# Patient Record
Sex: Female | Born: 1935 | Race: White | Hispanic: No | Smoking: Former smoker
Health system: Southern US, Community
[De-identification: ages and names within clinical notes are randomized; demographics above are authoritative.]

## PROBLEM LIST (undated history)

## (undated) DIAGNOSIS — I639 Cerebral infarction, unspecified: Secondary | ICD-10-CM

## (undated) DIAGNOSIS — I10 Essential (primary) hypertension: Secondary | ICD-10-CM

## (undated) DIAGNOSIS — F4321 Adjustment disorder with depressed mood: Secondary | ICD-10-CM

## (undated) DIAGNOSIS — E039 Hypothyroidism, unspecified: Secondary | ICD-10-CM

## (undated) DIAGNOSIS — I447 Left bundle-branch block, unspecified: Secondary | ICD-10-CM

## (undated) DIAGNOSIS — I255 Ischemic cardiomyopathy: Secondary | ICD-10-CM

## (undated) DIAGNOSIS — I214 Non-ST elevation (NSTEMI) myocardial infarction: Secondary | ICD-10-CM

## (undated) DIAGNOSIS — I469 Cardiac arrest, cause unspecified: Secondary | ICD-10-CM

## (undated) DIAGNOSIS — E785 Hyperlipidemia, unspecified: Secondary | ICD-10-CM

## (undated) DIAGNOSIS — I501 Left ventricular failure: Secondary | ICD-10-CM

## (undated) DIAGNOSIS — R54 Age-related physical debility: Secondary | ICD-10-CM

## (undated) HISTORY — DX: Cardiac arrest, cause unspecified: I46.9

## (undated) HISTORY — DX: Hyperlipidemia, unspecified: E78.5

## (undated) HISTORY — DX: Cerebral infarction, unspecified: I63.9

## (undated) HISTORY — DX: Left bundle-branch block, unspecified: I44.7

## (undated) HISTORY — DX: Essential (primary) hypertension: I10

## (undated) HISTORY — DX: Ischemic cardiomyopathy: I25.5

## (undated) HISTORY — DX: Hypothyroidism, unspecified: E03.9

## (undated) HISTORY — PX: OVARIAN CYST REMOVAL: SHX89

## (undated) HISTORY — DX: Non-ST elevation (NSTEMI) myocardial infarction: I21.4

## (undated) HISTORY — DX: Age-related physical debility: R54

## (undated) HISTORY — DX: Adjustment disorder with depressed mood: F43.21

## (undated) HISTORY — PX: DILATION AND CURETTAGE OF UTERUS: SHX78

---

## 1998-12-08 ENCOUNTER — Encounter: Payer: Self-pay | Admitting: Internal Medicine

## 1998-12-08 ENCOUNTER — Ambulatory Visit (HOSPITAL_COMMUNITY): Admission: RE | Admit: 1998-12-08 | Discharge: 1998-12-08 | Payer: Self-pay | Admitting: Internal Medicine

## 2000-02-01 ENCOUNTER — Ambulatory Visit (HOSPITAL_COMMUNITY): Admission: RE | Admit: 2000-02-01 | Discharge: 2000-02-01 | Payer: Self-pay | Admitting: Internal Medicine

## 2000-02-01 ENCOUNTER — Encounter: Payer: Self-pay | Admitting: Internal Medicine

## 2001-02-16 ENCOUNTER — Ambulatory Visit (HOSPITAL_COMMUNITY): Admission: RE | Admit: 2001-02-16 | Discharge: 2001-02-16 | Payer: Self-pay | Admitting: Internal Medicine

## 2001-02-16 ENCOUNTER — Encounter: Payer: Self-pay | Admitting: Internal Medicine

## 2001-02-21 ENCOUNTER — Encounter: Payer: Self-pay | Admitting: Internal Medicine

## 2001-02-21 ENCOUNTER — Encounter: Admission: RE | Admit: 2001-02-21 | Discharge: 2001-02-21 | Payer: Self-pay | Admitting: Internal Medicine

## 2002-02-18 ENCOUNTER — Ambulatory Visit (HOSPITAL_COMMUNITY): Admission: RE | Admit: 2002-02-18 | Discharge: 2002-02-18 | Payer: Self-pay | Admitting: Internal Medicine

## 2002-02-18 ENCOUNTER — Encounter: Payer: Self-pay | Admitting: Internal Medicine

## 2003-03-04 ENCOUNTER — Encounter: Payer: Self-pay | Admitting: Obstetrics and Gynecology

## 2003-03-04 ENCOUNTER — Ambulatory Visit (HOSPITAL_COMMUNITY): Admission: RE | Admit: 2003-03-04 | Discharge: 2003-03-04 | Payer: Self-pay | Admitting: Obstetrics and Gynecology

## 2003-05-20 ENCOUNTER — Ambulatory Visit (HOSPITAL_COMMUNITY): Admission: RE | Admit: 2003-05-20 | Discharge: 2003-05-20 | Payer: Self-pay | Admitting: Gastroenterology

## 2003-07-26 ENCOUNTER — Emergency Department (HOSPITAL_COMMUNITY): Admission: EM | Admit: 2003-07-26 | Discharge: 2003-07-26 | Payer: Self-pay | Admitting: Emergency Medicine

## 2004-03-04 ENCOUNTER — Ambulatory Visit (HOSPITAL_COMMUNITY): Admission: RE | Admit: 2004-03-04 | Discharge: 2004-03-04 | Payer: Self-pay | Admitting: Internal Medicine

## 2004-06-15 ENCOUNTER — Encounter: Admission: RE | Admit: 2004-06-15 | Discharge: 2004-06-15 | Payer: Self-pay | Admitting: Internal Medicine

## 2004-09-22 ENCOUNTER — Ambulatory Visit (HOSPITAL_COMMUNITY): Admission: RE | Admit: 2004-09-22 | Discharge: 2004-09-22 | Payer: Self-pay | Admitting: Obstetrics and Gynecology

## 2004-10-18 ENCOUNTER — Encounter (INDEPENDENT_AMBULATORY_CARE_PROVIDER_SITE_OTHER): Payer: Self-pay | Admitting: Specialist

## 2004-10-18 ENCOUNTER — Ambulatory Visit (HOSPITAL_COMMUNITY): Admission: RE | Admit: 2004-10-18 | Discharge: 2004-10-18 | Payer: Self-pay | Admitting: *Deleted

## 2005-04-28 ENCOUNTER — Ambulatory Visit (HOSPITAL_COMMUNITY): Admission: RE | Admit: 2005-04-28 | Discharge: 2005-04-28 | Payer: Self-pay | Admitting: Internal Medicine

## 2005-07-11 DIAGNOSIS — I469 Cardiac arrest, cause unspecified: Secondary | ICD-10-CM

## 2005-07-11 HISTORY — DX: Cardiac arrest, cause unspecified: I46.9

## 2006-03-21 ENCOUNTER — Ambulatory Visit: Payer: Self-pay | Admitting: Internal Medicine

## 2006-03-21 ENCOUNTER — Encounter (INDEPENDENT_AMBULATORY_CARE_PROVIDER_SITE_OTHER): Payer: Self-pay | Admitting: Specialist

## 2006-03-21 ENCOUNTER — Ambulatory Visit (HOSPITAL_COMMUNITY): Admission: RE | Admit: 2006-03-21 | Discharge: 2006-03-22 | Payer: Self-pay | Admitting: *Deleted

## 2006-05-25 ENCOUNTER — Ambulatory Visit (HOSPITAL_COMMUNITY): Admission: RE | Admit: 2006-05-25 | Discharge: 2006-05-25 | Payer: Self-pay | Admitting: Obstetrics and Gynecology

## 2006-08-02 ENCOUNTER — Other Ambulatory Visit: Admission: RE | Admit: 2006-08-02 | Discharge: 2006-08-02 | Payer: Self-pay | Admitting: *Deleted

## 2007-06-19 ENCOUNTER — Ambulatory Visit (HOSPITAL_COMMUNITY): Admission: RE | Admit: 2007-06-19 | Discharge: 2007-06-19 | Payer: Self-pay | Admitting: Internal Medicine

## 2007-12-10 DIAGNOSIS — I255 Ischemic cardiomyopathy: Secondary | ICD-10-CM

## 2007-12-10 DIAGNOSIS — I214 Non-ST elevation (NSTEMI) myocardial infarction: Secondary | ICD-10-CM

## 2007-12-10 HISTORY — DX: Non-ST elevation (NSTEMI) myocardial infarction: I21.4

## 2007-12-10 HISTORY — DX: Ischemic cardiomyopathy: I25.5

## 2007-12-20 ENCOUNTER — Inpatient Hospital Stay (HOSPITAL_COMMUNITY): Admission: EM | Admit: 2007-12-20 | Discharge: 2007-12-23 | Payer: Self-pay | Admitting: Emergency Medicine

## 2007-12-20 ENCOUNTER — Encounter (INDEPENDENT_AMBULATORY_CARE_PROVIDER_SITE_OTHER): Payer: Self-pay | Admitting: Emergency Medicine

## 2009-04-10 DIAGNOSIS — I639 Cerebral infarction, unspecified: Secondary | ICD-10-CM

## 2009-04-10 HISTORY — DX: Cerebral infarction, unspecified: I63.9

## 2009-05-15 ENCOUNTER — Emergency Department (HOSPITAL_COMMUNITY): Admission: EM | Admit: 2009-05-15 | Discharge: 2009-05-16 | Payer: Self-pay | Admitting: Emergency Medicine

## 2009-10-19 ENCOUNTER — Ambulatory Visit (HOSPITAL_COMMUNITY): Admission: RE | Admit: 2009-10-19 | Discharge: 2009-10-19 | Payer: Self-pay | Admitting: Internal Medicine

## 2010-04-01 ENCOUNTER — Ambulatory Visit: Payer: Self-pay | Admitting: Cardiology

## 2010-04-01 LAB — BASIC METABOLIC PANEL: BUN: 24 mg/dL — AB (ref 4–21)

## 2010-04-01 LAB — LIPID PANEL
Cholesterol: 172 mg/dL (ref 0–200)
LDL Cholesterol: 91 mg/dL
Triglycerides: 126 mg/dL (ref 40–160)

## 2010-06-17 ENCOUNTER — Ambulatory Visit: Payer: Self-pay | Admitting: Cardiology

## 2010-08-16 ENCOUNTER — Encounter: Payer: Self-pay | Admitting: Cardiology

## 2010-08-16 DIAGNOSIS — E039 Hypothyroidism, unspecified: Secondary | ICD-10-CM | POA: Insufficient documentation

## 2010-08-16 DIAGNOSIS — E785 Hyperlipidemia, unspecified: Secondary | ICD-10-CM | POA: Insufficient documentation

## 2010-08-16 DIAGNOSIS — I255 Ischemic cardiomyopathy: Secondary | ICD-10-CM | POA: Insufficient documentation

## 2010-10-04 ENCOUNTER — Other Ambulatory Visit: Payer: Self-pay | Admitting: Cardiology

## 2010-10-04 ENCOUNTER — Other Ambulatory Visit (INDEPENDENT_AMBULATORY_CARE_PROVIDER_SITE_OTHER): Payer: Medicare Other | Admitting: *Deleted

## 2010-10-04 DIAGNOSIS — E7800 Pure hypercholesterolemia, unspecified: Secondary | ICD-10-CM

## 2010-10-04 DIAGNOSIS — R0789 Other chest pain: Secondary | ICD-10-CM

## 2010-10-05 LAB — COMPREHENSIVE METABOLIC PANEL
ALT: 10 U/L (ref 0–35)
AST: 18 U/L (ref 0–37)
Albumin: 4.5 g/dL (ref 3.5–5.2)
Alkaline Phosphatase: 62 U/L (ref 39–117)
Calcium: 9.2 mg/dL (ref 8.4–10.5)
Chloride: 105 mEq/L (ref 96–112)
Creat: 1.03 mg/dL (ref 0.40–1.20)
Glucose, Bld: 97 mg/dL (ref 70–99)
Potassium: 4.4 mEq/L (ref 3.5–5.3)
Sodium: 145 mEq/L (ref 135–145)
Total Bilirubin: 1.2 mg/dL (ref 0.3–1.2)
Total Protein: 6.9 g/dL (ref 6.0–8.3)

## 2010-10-05 LAB — LIPID PANEL
HDL: 55 mg/dL (ref >39)
LDL Cholesterol: 81 mg/dL (ref 0–99)
Total CHOL/HDL Ratio: 3 Ratio
Triglycerides: 133 mg/dL (ref <150)

## 2010-10-06 ENCOUNTER — Ambulatory Visit (INDEPENDENT_AMBULATORY_CARE_PROVIDER_SITE_OTHER): Payer: Medicare Other | Admitting: Cardiology

## 2010-10-06 ENCOUNTER — Encounter: Payer: Self-pay | Admitting: Cardiology

## 2010-10-06 VITALS — BP 110/60 | HR 68 | Wt 149.4 lb

## 2010-10-06 DIAGNOSIS — E785 Hyperlipidemia, unspecified: Secondary | ICD-10-CM

## 2010-10-06 DIAGNOSIS — I2589 Other forms of chronic ischemic heart disease: Secondary | ICD-10-CM

## 2010-10-06 DIAGNOSIS — Z79899 Other long term (current) drug therapy: Secondary | ICD-10-CM

## 2010-10-06 DIAGNOSIS — I509 Heart failure, unspecified: Secondary | ICD-10-CM

## 2010-10-06 DIAGNOSIS — I255 Ischemic cardiomyopathy: Secondary | ICD-10-CM

## 2010-10-06 DIAGNOSIS — E039 Hypothyroidism, unspecified: Secondary | ICD-10-CM

## 2010-10-06 LAB — COMPREHENSIVE METABOLIC PANEL
ALT: 12 U/L (ref 0–35)
AST: 19 U/L (ref 0–37)
Albumin: 4.5 g/dL (ref 3.5–5.2)
BUN: 20 mg/dL (ref 6–23)
CO2: 25 mEq/L (ref 19–32)
Calcium: 9.3 mg/dL (ref 8.4–10.5)
Chloride: 103 mEq/L (ref 96–112)
Creat: 1.09 mg/dL (ref 0.40–1.20)
Glucose, Bld: 82 mg/dL (ref 70–99)
Potassium: 4.1 mEq/L (ref 3.5–5.3)
Sodium: 141 mEq/L (ref 135–145)
Total Bilirubin: 1.2 mg/dL (ref 0.3–1.2)
Total Protein: 6.9 g/dL (ref 6.0–8.3)

## 2010-10-06 LAB — LIPID PANEL
Cholesterol: 173 mg/dL (ref 0–200)
HDL: 56 mg/dL (ref >39)
LDL Cholesterol: 82 mg/dL (ref 0–99)
Total CHOL/HDL Ratio: 3.1 Ratio
Triglycerides: 175 mg/dL — ABNORMAL HIGH (ref <150)
VLDL: 35 mg/dL (ref 0–40)

## 2010-10-06 LAB — CBC WITH DIFFERENTIAL/PLATELET
Basophils Absolute: 0 10*3/uL (ref 0.0–0.1)
Basophils Relative: 0 % (ref 0–1)
Eosinophils Absolute: 0.2 10*3/uL (ref 0.0–0.7)
Eosinophils Relative: 3 % (ref 0–5)
HCT: 40.7 % (ref 36.0–46.0)
Hemoglobin: 13 g/dL (ref 12.0–15.0)
Lymphocytes Relative: 37 % (ref 12–46)
Lymphs Abs: 2.5 10*3/uL (ref 0.7–4.0)
MCH: 30.1 pg (ref 26.0–34.0)
MCV: 94.2 fL (ref 78.0–100.0)
Monocytes Absolute: 0.6 10*3/uL (ref 0.1–1.0)
Monocytes Relative: 9 % (ref 3–12)
Neutro Abs: 3.4 10*3/uL (ref 1.7–7.7)
Neutrophils Relative %: 51 % (ref 43–77)
RBC: 4.32 MIL/uL (ref 3.87–5.11)
WBC: 6.6 10*3/uL (ref 4.0–10.5)

## 2010-10-06 LAB — TSH: TSH: 1.454 u[IU]/mL (ref 0.350–4.500)

## 2010-10-06 NOTE — Assessment & Plan Note (Signed)
Lab work is pending today

## 2010-10-06 NOTE — Progress Notes (Signed)
Addended by: Barnetta Hammersmith on: 10/06/2010 03:45 PM   Modules accepted: Orders

## 2010-10-06 NOTE — Assessment & Plan Note (Signed)
I really think Mrs. Sistrunk distal well. With her last lab work, her BNP was only 6. For now, we'll continue her current medicines but we will repeat her 2-D echocardiogram. I'll check C. Met, lipids, CBC, dig level, TSH and BNP today. I will have her see Lawson Fiscal in 6 months.

## 2010-10-06 NOTE — Progress Notes (Signed)
Subjective:   [name] is seen today for followup visit. For 77 year old, she is doing great. She had congestive heart failure and left ventricular dysfunction by echocardiography in June of 2011. She refused cardiac catheterization. At that time, she was under a great deal of stress with the death of her husband. She is improved significantly and is increasing her activity with minimal symptoms of weakness or shortness of breath. About 2 weeks ago, she did have a mild GI bug but otherwise has been doing well. She had some blurred vision a few weeks ago there have been attributed to dry eyes. She has not had a repeat echocardiogram  Current Outpatient Prescriptions  Medication Sig Dispense Refill  . aspirin 81 MG tablet Take 81 mg by mouth daily.        Marland Kitchen atorvastatin (LIPITOR) 10 MG tablet Take 10 mg by mouth daily.        . digoxin (LANOXIN) 0.125 MG tablet Take 125 mcg by mouth daily.        . furosemide (LASIX) 20 MG tablet Take 20 mg by mouth daily.        Marland Kitchen levothyroxine (SYNTHROID, LEVOTHROID) 75 MCG tablet Take 75 mcg by mouth daily.        Marland Kitchen lisinopril (PRINIVIL,ZESTRIL) 2.5 MG tablet Take 2.5 mg by mouth daily.        . nitroGLYCERIN (NITRODUR) 0.2 mg/hr Place 1 patch onto the skin at bedtime.        . nitroGLYCERIN (NITROSTAT) 0.4 MG SL tablet Place 0.4 mg under the tongue every 5 (five) minutes as needed.        . potassium chloride (KLOR-CON) 10 MEQ CR tablet Take 10 mEq by mouth daily.        Marland Kitchen DISCONTD: levothyroxine (SYNTHROID, LEVOTHROID) 25 MCG tablet Take 25 mcg by mouth daily.         Allergies  Allergen Reactions  . Novocain     Patient Active Problem List  Diagnoses  . Ischemic cardiomyopathy  . Hypothyroidism  . Hyperlipemia    History  Smoking status  . Former Smoker  . Quit date: 07/11/1968  Smokeless tobacco  . Never Used    History  Alcohol Use No    Family History  Problem Relation Age of Onset  . Aneurysm Father   . Heart attack Mother      Review of Systems:   The patient denies any heat or cold intolerance.  No weight gain or weight loss.  The patient denies headaches or blurry vision.  There is no cough or sputum production.  The patient denies dizziness.  There is no hematuria or hematochezia.  The patient denies any muscle aches or arthritis.  The patient denies any rash.  The patient denies frequent falling or instability.  There is no history of depression or anxiety.  All other systems were reviewed and are negative.   Physical Exam:   Her weight is 149. Blood pressure is 90/58 sitting, 110/60 standing, heart rate is 68.The head is normocephalic and atraumatic.  Pupils are equally round and reactive to light.  Sclerae nonicteric.  Conjunctiva is clear.  Oropharynx is unremarkable.  There's adequate oral airway.  Neck is supple there are no masses.  Thyroid is not enlarged.  There is no lymphadenopathy.  Lungs are clear.  Chest is symmetric.  Heart shows a regular rate and rhythm.  S1 and S2 are normal.  There is no murmur click or gallop.  Abdomen is soft  normal bowel sounds.  There is no organomegaly.  Genital and rectal deferred.  Extremities are without edema.  Peripheral pulses are adequate.  Neurologically intact.  Full range of motion.  The patient is not depressed.  Skin is warm and dry.  Assessment / Plan:

## 2010-10-06 NOTE — Assessment & Plan Note (Signed)
Lab work is pending today 

## 2010-10-07 ENCOUNTER — Telehealth: Payer: Self-pay | Admitting: *Deleted

## 2010-10-07 ENCOUNTER — Other Ambulatory Visit (HOSPITAL_COMMUNITY): Payer: Self-pay | Admitting: Radiology

## 2010-10-07 LAB — BRAIN NATRIURETIC PEPTIDE: Brain Natriuretic Peptide: 7.7 pg/mL (ref 0.0–100.0)

## 2010-10-07 LAB — DIGOXIN LEVEL: Digoxin Level: 0.9 ng/mL (ref 0.8–2.0)

## 2010-10-07 NOTE — Telephone Encounter (Signed)
Pt notified of lab results and told to continue same medications.   

## 2010-10-07 NOTE — Telephone Encounter (Signed)
Message copied by Barnetta Hammersmith on Thu Oct 07, 2010  2:32 PM ------      Message from: Norma Fredrickson      Created: Thu Oct 07, 2010  1:20 PM       Ok to report. Labs are satisfactory.

## 2010-10-08 ENCOUNTER — Ambulatory Visit (HOSPITAL_COMMUNITY): Payer: Medicare Other | Attending: Cardiology | Admitting: Radiology

## 2010-10-08 DIAGNOSIS — I255 Ischemic cardiomyopathy: Secondary | ICD-10-CM

## 2010-10-08 DIAGNOSIS — E785 Hyperlipidemia, unspecified: Secondary | ICD-10-CM | POA: Insufficient documentation

## 2010-10-08 DIAGNOSIS — I428 Other cardiomyopathies: Secondary | ICD-10-CM | POA: Insufficient documentation

## 2010-10-08 DIAGNOSIS — I10 Essential (primary) hypertension: Secondary | ICD-10-CM | POA: Insufficient documentation

## 2010-10-08 DIAGNOSIS — I447 Left bundle-branch block, unspecified: Secondary | ICD-10-CM | POA: Insufficient documentation

## 2010-10-08 DIAGNOSIS — Z8249 Family history of ischemic heart disease and other diseases of the circulatory system: Secondary | ICD-10-CM | POA: Insufficient documentation

## 2010-10-12 ENCOUNTER — Telehealth: Payer: Self-pay | Admitting: *Deleted

## 2010-10-12 NOTE — Telephone Encounter (Signed)
Pt notified of echo results and to continue same medications.

## 2010-11-11 ENCOUNTER — Other Ambulatory Visit (HOSPITAL_COMMUNITY): Payer: Self-pay | Admitting: Internal Medicine

## 2010-11-11 DIAGNOSIS — Z1231 Encounter for screening mammogram for malignant neoplasm of breast: Secondary | ICD-10-CM

## 2010-11-15 ENCOUNTER — Encounter: Payer: Self-pay | Admitting: Cardiology

## 2010-11-19 ENCOUNTER — Ambulatory Visit (HOSPITAL_COMMUNITY)
Admission: RE | Admit: 2010-11-19 | Discharge: 2010-11-19 | Disposition: A | Discharge status: Home / Self Care | Payer: Medicare Other | Source: Ambulatory Visit | Attending: Internal Medicine | Admitting: Internal Medicine

## 2010-11-19 DIAGNOSIS — Z1231 Encounter for screening mammogram for malignant neoplasm of breast: Secondary | ICD-10-CM | POA: Insufficient documentation

## 2010-11-23 NOTE — H&P (Signed)
[name], [name] NO.:  [id number]   MEDICAL RECORD NO.:  [id number]          PATIENT TYPE:  EMS   LOCATION:  MAJO                         FACILITY:  [facility]   PHYSICIAN:  Elliot Cousin, M.D.    DATE OF BIRTH:  January 21, 1917   DATE OF ADMISSION:  12/20/2007  DATE OF DISCHARGE:                              HISTORY & PHYSICAL   PRIMARY CARE PHYSICIAN:  Dr. Burton Apley.   CHIEF COMPLAINT:  Shortness of breath.  It happened all of a sudden.   HISTORY OF PRESENT ILLNESS:  The patient is an 77 year old woman with a  past medical history significant for hypothyroidism and hyperlipidemia.  She also has a history of cardiac arrest during a planned operation to  remove a left ovarian cyst either in 2007 or 2008.  The patient's  cardiac status apparently returned spontaneously.  Since that time she  has had no complaints of chest pain or shortness of breath until this  morning.  While she was eating breakfast she developed several episodes  of nonproductive coughing.  She says that she did not get choked on her  food.  While she was coughing she realized that her chest was  rattling.  She complained of chest congestion to her family at that  time.  She also says that she became short of breath all of a sudden.  She had no chest pain, chest pressure, left jaw pain or left arm pain.  She did have some generalized achiness in both legs last night which she  says happens periodically.  When she gets up and walks around, her legs  stop hurting.  She has had no bilateral leg swelling and no unilateral  leg swelling.  She denies any associated fever, chills, headache, chest  pain or abdominal pain.  She did, however, experience some abdominal  distention and nausea last night but no vomiting.  There has been no  recent constipation, diarrhea, bloody stools or black tarry stools.  The  patient also denies any pain with urination.   During the evaluation in the emergency  department the patient was placed  on BiPAP.  Apparently prior to her arrival to the emergency department,  EMS gave her 38 mg of Lasix, two sublingual nitroglycerin, and started  CPAP.  Her oxygen saturations ranged in the upper 80's per the EMT.  An  ABG was ordered by the emergency department physician on BiPAP, and it  revealed a pH of 7.3, pCO2 of 43.5, and pO2 of 209.  Her lab data are  also significant for a BNP of 327 and a D-dimer of 1.13.  Her cardiac  markers so far have been negative.  The patient will be admitted for  further evaluation and management.   PAST MEDICAL HISTORY:  1. Left ovarian cyst in 2007 or 2008 with a planned operation.      Apparently the patient had a cardiac arrest which resolved      spontaneously.  The remainder of the operation was aborted.  The      patient says that she had a stress test shortly thereafter, and  was      told that it was negative.  2. Hypothyroidism.  3. Hyperlipidemia.   MEDICATIONS:  1. Lipitor 10 mg q.h.s.  2. Synthroid 75 mcg daily.   SOCIAL HISTORY:  The patient is married.  [place]  She is [place].  She has two children.  She quit smoking more  than 60 years ago.  She has no history of alcohol or illicit drug use.  She still drives occasionally.   FAMILY HISTORY:  Her mother died of a ruptured aneurysm at 16 years of  age.  She also had an enlarged heart.  Her father died of a heart attack  at 46 years of age.   REVIEW OF SYSTEMS:  The patient's Review of Systems is positive for  chronic achiness in both legs.   PHYSICAL EXAMINATION:  VITAL SIGNS:  Temperature 97.7, blood pressure  106/72, pulse 69, respiratory rate 24, oxygen saturation 98% on 2 liters  of oxygen.  GENERAL:  The patient is a pleasant 77 year old woman who is currently  in no acute distress.  She is status post receiving 38 mg of intravenous  Lasix.  HEENT:  Head is normocephalic, nontraumatic.  Pupils equal,  round,  reactive to light.  Extraocular movements are intact.  Conjunctivae are  clear.  Sclerae white.  Nasal mucosa is mildly dry.  No sinus  tenderness.  Oropharynx reveals moist mucous membranes.  Teeth are in  fair repair.  No posterior exudates or erythema.  NECK:  Supple.  No adenopathy, no thyromegaly, no bruit, and possibly  mild JVD bilaterally.  HEART:  S1, S2 with a soft systolic murmur.  LUNGS:  Occasional crackles in the bases and clear anteriorly.  Breathing is currently nonlabored.  ABDOMEN:  Mildly obese, positive bowel sounds, soft, nontender,  nondistended.  No hepatosplenomegaly, no masses palpated.  EXTREMITIES:  Pedal pulses are palpable bilaterally.  No pretibial  edema.  No pedal edema.  No calf tenderness.  No erythema of her calf  muscles or legs.  No acute joint findings.  NEUROLOGIC:  The patient is alert and oriented x3.  Cranial nerves II-  XII are intact.  Strength is 5/5 throughout.  Sensation is intact.   ADMISSION LABORATORIES:  EKG reveals normal sinus rhythm with premature  ventricular contractions and a heart rate of 92 beats per minute.  Left  bundle branch block and left axis deviation.  Question ST elevations in  the inferior leads.   D-dimer 1.13.  Urinalysis essentially negative.  BNP 327.  Sodium 140,  potassium 3.5, chloride 107, CO2 23, glucose 245, BUN 20, creatinine  1.09.  Total bilirubin 2, alkaline phosphatase 60.  SGOT 35, SGPT 20.  Total protein 6.3. Albumin 3.9.  Calcium 8.8.  WBC 6, hemoglobin 13.4,  platelets 140.  CK-MB 1.5, troponin I less than 0.05, myoglobin 79.3.   ASSESSMENT:  1. Acute shortness of breath/dyspnea.  The etiology is unclear at this      time.  However, the differential diagnosis includes acute      congestive heart failure, pulmonary embolism, and cardiac/coronary      artery disease with ischemia.  We will also consider pneumonia,      although the patient has no leukocytosis and she is completely       afebrile.  2. Bilateral lower lung airspace opacity and cardiomegaly per chest x-      ray.  Given the elevated BNP I favor congestive heart failure  over      pneumonia.  She was given 1 g of Rocephin by the emergency      department physician.  3. Elevated glucose.  The patient has no history of type 2 or type 1      diabetes mellitus.  The patient may have received dextrose by the      EMT.  4. Hyperbilirubinemia.  The patient's total bilirubin is 2, and the      remainder of her liver panel is relatively unremarkable.  The      patient's abdomen is nontender.  5. Hypothyroidism.  The patient is treated chronically with Synthroid.  6. Hyperlipidemia.  The patient is treated chronically with Lipitor.   PLAN:  1. As indicated above Lasix at 38 mg IV was given to the patient by      the EMT.  She has diuresed over 1300 mL of urine.  2. Status post BiPAP in the emergency department.  The patient appears      to be improved with regards to her pulmonary status.  BiPAP will be      discontinued.  3. Will continue intravenous Lasix 40 mg IV q.12 h.  Will continue to      monitor the patient's I's and O's and renal function.  4. Will discontinue antibiotic treatment as the patient was given 1 g      of Rocephin in the emergency department.  5. Will add a small dose of nitroglycerin paste and taper accordingly.      For further evaluation will check a CT scan of the chest to rule      out PE and a 2-D echocardiogram to assess the patient's left      ventricular function.  6. Will check cardiac enzymes, TSH, and a fasting lipid panel.  We      will also assess the patient's hemoglobin A1c.      Elliot Cousin, M.D.  Electronically Signed     DF/MEDQ  D:  12/20/2007  T:  12/20/2007  Job:  [id number]   cc:   Antony Madura, M.D.

## 2010-11-23 NOTE — Discharge Summary (Signed)
NAMECAROLIE, [name] NO.:  [id number]   MEDICAL RECORD NO.:  [id number]          PATIENT TYPE:  INP   LOCATION:  4703                         FACILITY:  [facility]   PHYSICIAN:  [name], DuarteDATE OF BIRTH:  1916-09-30   DATE OF ADMISSION:  12/20/2007  DATE OF DISCHARGE:  12/23/2007                               DISCHARGE SUMMARY   DISCHARGE DIAGNOSES:  1. Newly diagnosed severe systolic congestive heart failure.      a.     Likely ischemic in nature.  The patient does not wish to       undergo cardiac catheterization.      b.     Ejection fraction 20%-25% via echocardiogram, with severe       diffuse left ventricular hypokinesis.      c.     Clinically well compensated on medical therapy.  2. Probable coronary artery disease, with questionable subendocardial      myocardial infarction.  Medical therapy only.  The patient does not      wish to undergo cardiac catheterization.      a.     Status post known cardiac arrest during elective surgery in       2007.  3. Hyperlipidemia.  4. Hypothyroidism.  Synthroid therapy increased  5. Status post left ovarian cyst resection in 2007.  6. Reported allergy to NOVOCAINE.   DISCHARGE MEDICATIONS:  1. Lipitor 10 mg p.o. daily.  2. Synthroid 88 mcg p.o. daily.  3. Baby aspirin 81 mg daily.  4. Lisinopril 2.5 mg p.o. daily.  5. Lanoxin 0.125 mg p.o. daily.  6. Lasix 20 mg p.o. daily.  7. Potassium chloride 10 mEq p.o. daily.  8. Nitro-Dur patch 0.2 mg/hour, to be placed at bedtime every night      and removed at suppertime every night.   FOLLOWUP:  1. The patient is to follow up with Dr. Duffy Rhody Duarte/[name] in 1-2 weeks.  She is to call the office at [phone] to      arrange for this follow-up appointment.  2. The patient is advised to follow up with Dr. Burton Duarte as      previously scheduled.  The patient should receive a TSH in 6-8      weeks to reassess her adjusted Synthroid  dose.   PROCEDURES:  1. CT scan of the chest on December 20, 2007.  No evidence of pulmonary      embolism.  Moderate bilateral pleural effusions, with associated      bibasilar atelectasis/pulmonary edema.  2. Transthoracic echocardiogram, December 20, 2007.  LV mildly to      moderately dilated.  Overall LV systolic function severely reduced.      Ejection fraction estimated at 20%-25%.  Severe diffuse left      ventricular hypokinesis.  Aortic valve thickness mildly increased.      Minimal calcification of the mitral valve of.  Moderate mitral      valve regurgitation.   CONSULTATIONS:  Dr. Duffy Rhody Duarte/[name] with [name].   [place] COURSE:  [name] is an extremely pleasant 77-year-  old female with the above-listed medical history.  She presented to  [facility] on December 20, 2007 with the complaints of sudden onset of severe  shortness of breath.  She stated that she had been in her usual state of  health until she suddenly became severely short of breath following an  episode of severe nonproductive cough on the morning of her admission.  The patient was admitted to the acute unit.  Given her history of MI,  she was ruled out for acute MI with cardiac enzymes.  Cardiac enzymes in  fact proved to persistently be mildly elevated, with troponins as high  as 0.18.  EKG revealed a left bundle branch block which was known to be  chronic but was nonetheless not helpful in evaluation of possible acute  coronary syndrome.  Because of the acuity of the patient's symptoms,  pulmonary embolism was considered as well.  A CT scan of the chest was  carried out using an angiogram protocol and failed to reveal any  evidence of pulmonary embolism.  Transthoracic echocardiogram was then  carried out and revealed the findings noted above.  With a new diagnosis  of severe systolic congestive heart failure, the need for risk  stratification for coronary  disease was considered.  Dr. Deborah Duarte was  consulted to evaluate the patient.  A long discussion was carried out  with the patient between the hospitalist physician as well as the  cardiology team.  The patient ultimately decide dd to pursue  conservative medical care only and did not wish to undergo cardiac  catheterization to rule out significant coronary disease.  Given the  patient's advanced age, this was not felt to be an entirely unreasonable  approach.  Ongoing care from that point forward of focused on ensuring  that the patient's medications were titrated to allow maximal medical  care of the patient's systolic congestive heart failure.  By December 23, 2007, the patient's symptoms were resolved.  She was ambulating without  difficulty.  She had no shortness of breath and no chest pain.  She was  discharged on the above-listed medical regimen, with followup with Dr.  Deborah Duarte as detailed above.      [name], M.D.  Electronically Signed     [name]  D:  12/23/2007  T:  12/23/2007  Job:  [id number]   cc:   [name], M.D.  [name], M.D.

## 2010-11-23 NOTE — H&P (Signed)
NAMEJAHLEAH, MARISCAL NO.:  [id number]   MEDICAL RECORD NO.:  [id number]           PATIENT TYPE:   LOCATION:                                 FACILITY:   PHYSICIAN:  Colleen Can. Deborah Chalk, M.D.DATE OF BIRTH:  30-May-1917   DATE OF ADMISSION:  DATE OF DISCHARGE:                              HISTORY & PHYSICAL   Thank you very much for asking [place] to see Ms. Blinder.  She is a very  pleasant and alert 77 year old female who appears younger than her  stated age.  She is being evaluated for heart failure.  She developed  acute onset of significant shortness of breath that occurred on the  morning of admission.  She began to cough and after eating breakfast had  no actual chest pain.  She was treated with Lasix, Rocephin, and BiPAP.  Cardiac enzymes were mildly elevated.  Ejection fraction by  echocardiogram is 20%-25%.  She is currently feeling much better and is  anxious to go home.  She is not anxious for any interventional  evaluation.   Past medical history includes hypothyroidism and hyperlipidemia.  She  had a cardiac arrest in 2007 during a planned ovarian surgery that  resolved spontaneously after her stomach was deflated.  She had a  negative stress Cardiolite by Dr. Elease Hashimoto thereafter.  She has a history  of left bundle branch block and chronic.   She has allergies on NOVOCAINE.   MEDS:  Lipitor 10 mg day, Synthroid 75 mcg a day, Lasix 40 mg IV b.i.d.,  and Nitrol paste.   FAMILY HISTORY:  Father died of ruptured aneurysm age 39.  Mother died  at age 79 of myocardial infarction.   SOCIAL HISTORY:  She is married.  She is retired from [employer].  She quit smoking over 60 years ago.  No alcohol.   REVIEW OF SYSTEMS:  No chest pain.  She still is active and works in her  yard.  She does not really have any significant salt intake.  She has  some history of leg cramps.   On exam, she is pleasant in no acute distress.  Blood pressure 103/67,  heart rate  is in the 80s, and O2 sat is 94%.  Skin is warm and dry.  Color is normal.  Lungs were clear.  Heart shows regular rate and  rhythm.  There is a soft S3.  Abdomen is soft with normal bowel sounds.  Extremities are negative.  Neurologically, she has no defects.   LABS:  PH on admission is 7.34, pCO2 is 43, pO2 is 209, bicarb is 23,  troponin 0.12, 0.18, and 0.15, CPKs peaked at 11.2, and BMP was 474.  The CT scan of the chest showed no pulmonary embolism and moderate  bilateral pleural effusions.  There is questionable mild edema.  EKG  showed left bundle branch block.   OVERALL IMPRESSION:  1. Dyspnea, questionable elevated cardiac enzymes.  2. Chronic left bundle branch block.  3. Past medical history of cardiac arrest, resolved spontaneously.  4. Severe left ventricular dysfunction by echocardiogram.   PLAN:  We agree  with the diuretics and nitrates.  We will add ACE  inhibitor.  She wishes to proceed on a conservative route and at age 55  I think that is reasonable.  She does have left bundle branch block.  This may be a nonischemic cardiomyopathy and just be an exacerbation  that is nonischemic induced.  I think it would be important to know that  information, but we could treat her empirically.      Colleen Can. Deborah Chalk, M.D.  Electronically Signed     SNT/MEDQ  D:  12/21/2007  T:  12/22/2007  Job:  [id number]   cc:   Lonia Blood, M.D.

## 2010-11-26 NOTE — H&P (Signed)
NAMESOLE, LENGACHER              ACCOUNT NO.:  [id number]   MEDICAL RECORD NO.:  [id number]          PATIENT TYPE:  AMB   LOCATION:  DAY                          [facility]AC[facility]   PHYSICIAN:  Almedia Balls. Fore, M.D.   DATE OF BIRTH:  July 04, 1917   DATE OF ADMISSION:  10/18/2004  DATE OF DISCHARGE:                                HISTORY & PHYSICAL   HISTORY:  The patient is an 77 year old with postmenopausal bleeding for  hysteroscopy, D&C.  She has been full counseled as to the nature of the  procedure and the risks involved including the risk of anesthesia, injury to  uterus, tubes, ovaries, bowel, bladder, blood vessels, ureters,  postoperative hemorrhage, infection, and recuperation.  She fully  understands all these considerations and has signed informed consent to  proceed on October 18, 2004.   PAST MEDICAL HISTORY:  Noncontributory.   FAMILY HISTORY:  Noncontributory.   She takes no medications, and is allergic to NOVOCAINE.   REVIEW OF SYSTEMS:  HEENT:  Several missing teeth.  Wears glasses.  CARDIORESPIRATORY:  Negative.  GASTROINTESTINAL:  Negative.  GENITOURINARY:  As in present illness.  NEUROMUSCULAR:  Negative.   PHYSICAL EXAMINATION:  VITAL SIGNS:  Height 5 feet 6 inches; weight 159  pounds; blood pressure 148/92; pulse 88; respirations 20; temperature 97.9.  GENERAL:  Well-developed white female in no acute distress.  HEENT:  Within normal limits.  NECK:  Supple, without masses, adenopathy, or bruits.  HEART:  Regular rate and rhythm, without murmurs.  LUNGS:  Clear to P&A.  BREASTS:  Sitting and lying, without mass.  ABDOMEN:  Flat and soft, without masses.  PELVIC:  External genitalia, Bartholin's, urethra, and Skene's glands within  normal limits.  Vagina is atrophic.  Cervix slightly inflamed.  Uterus  midposition.  Normal size, shape, and contour.  There are no palpable  adnexal masses.  Rectovaginal exam confirmatory.  EXTREMITIES:  Within normal limits.  CENTRAL NERVOUS SYSTEM:  Grossly intact.  SKIN:  Without suspicious lesions.   IMPRESSION:  Postmenopausal bleeding.   DISPOSITION:  Diagnostic hysteroscopy, fractional D&C.    SRF/MEDQ  D:  10/18/2004  T:  10/18/2004  Job:  [id number]

## 2010-11-26 NOTE — Consult Note (Signed)
[name], [name]              ACCOUNT NO.:  [id number]   MEDICAL RECORD NO.:  [id number]          PATIENT TYPE:  OIB   LOCATION:  1415                         FACILITY:  [facility]   PHYSICIAN:  Duke Salvia, MD, FACCDATE OF BIRTH:  1916-09-17   DATE OF CONSULTATION:  DATE OF DISCHARGE:  03/22/2006                                   CONSULTATION   We were asked to see [name] because of asystole during GYN surgery  today.  She had a negative Cardiolite in anticipation of the surgery  pertinent to an ischemia.  She had normal left ventricular function.   Yesterday during insufflation of her abdomen for her laparoscopic surgery  she developed progressive bradycardia with asystole.  Her heartbeat resumed  about 30 seconds later.   Review of these tracings in the order placed in the chart, the time of which  in some is discernible and others not, demonstrates that there is QT  prolongation along the strips that are seen.  This is sometimes manifested  as R prolongation prior to the development of asystole.   The patient has no antecedent history of syncope or presyncope.   Her exercise tolerance is quite good.   I suspect that this is all vagally mediated in the context of her underlying  left bundle-branch block and in the absence of antecedent symptoms or at  this point manifestations of heart block, I would not feel she necessarily  needs a pacemaker.  A 30-day outpatient auto-event event detector might be  of value to see whether this may be the harbinger of things to come.   Thanks very much for asking [place] to see her.           ______________________________  Duke Salvia, MD, [facility]     SCK/MEDQ  D:  03/22/2006  T:  03/22/2006  Job:  [id number]

## 2010-11-26 NOTE — Op Note (Signed)
[name], [name]              ACCOUNT NO.:  [id number]   MEDICAL RECORD NO.:  [id number]          PATIENT TYPE:  AMB   LOCATION:  DAY                          FACILITY:  [facility]   PHYSICIAN:  Almedia Balls. Fore, M.D.   DATE OF BIRTH:  [birth year]   DATE OF PROCEDURE:  03/21/2006  DATE OF DISCHARGE:                                 OPERATIVE REPORT   PREOPERATIVE DIAGNOSIS:  Persisting left ovarian cyst with enlargement.   POSTOPERATIVE DIAGNOSES:  1. Persisting left ovarian cyst with enlargement.  2. Abnormal uterine bleeding.   OPERATION:  Laparoscopy, endometrial biopsy.   ANESTHESIA:  General orotracheal.   INDICATIONS FOR SURGERY:  The patient is an 77 year old with the above-noted  problems who was counseled as to the need for surgery to evaluate this  problem.  She was fully counseled as to the type of surgery to be performed  and the risks involved to include risks of anesthesia, injury to uterus,  tubes, ovaries, bowel, bladder, blood vessels, ureters, postoperative  hemorrhage, infection, recuperation.  She fully understands all these  considerations and has signed informed consent to proceed on March 21, 2006.   OPERATIVE FINDINGS:  On laparoscopy, the lower liver edge, gallbladder,  spleen, diaphragms, peritoneum and appendiceal area were normal to  visualization.  In the pelvis, the uterus was mid posterior and somewhat  irregular with probable myomata present.  The right ovary appeared to be  atrophic and within normal limits.  The left adnexal structures were  involved with very dense adhesions involving overlying peritoneal tissue and  loops of descending rectosigmoid which were densely adherent to the left  ovary.  There were also large venous varicosities in the area of the left  adnexal area and left lateral peritoneal surface.  Following placement of  the acorn cannula in the cervix, it was also found that she had somewhat  profuse bleeding through the  cannula itself.  This was evaluated with a  speculum exam which revealed that the cervix itself was not bleeding and  that the bleeding was coming from the endometrial cavity.  Biopsy and  removal of any tissue from the endometrium was then performed which produced  only normal-appearing tissue.  The bleeding was quickly stopped. Following  first placement of instruments within the peritoneal cavity and insufflation  with carbon dioxide, the patient experienced a gradual decrease in heart  rate and later asystole which was found to be 35 seconds in length.  Blood  pressure was maintained throughout this time.  She responded quickly to an  infusion or IV injection of atropine which then kept her heart rate up the  remainder of the operating time.  A 12-lead electrocardiogram was done and  compared with previous electrocardiogram and found to be no different.  On  advice from the anesthesiologist, Dr. Rica Mast, the procedure was continued  and the patient again monitored closely as she had been before.   DESCRIPTION OF PROCEDURE:  With the patient under general anesthesia,  prepared and draped in the usual sterile fashion, a speculum was placed in  the vagina and  a single-tooth tenaculum and acorn cannula were placed on the  cervix.  The patient was then catheterized with a red rubber catheter and  free flow of clear urine.  She was then prepared and draped for a  laparoscopic procedure.  An incision was made in the lower pole of the  umbilicus with insertion of the Veress cannula and insufflation of 3 liters  of carbon dioxide.  The disposable 10/11 trocar for the operative scope and  the scope itself were then inserted into the peritoneal cavity without  difficulty.  A disposable 5-mm probe was inserted through a stab wound just  above the symphysis pubis which was well above the reflection of the  bladder.  It was at this point that the gradual decrease in heart rate and  asystole  occurred.  All instruments were removed from the peritoneal cavity,  and  gas was allowed to escape.  The patient was then given the atropine  with rapid return of heart rate.  The 12-lead electrocardiogram was then  performed and found to be within normal limits and unchanged from the  previous electrocardiogram.  Again on advice from the anesthesiologist and  the patient's stable condition, it was felt that proceeding with the  procedure was indicated.  Accordingly, the patient was prepared and draped  in the usual sterile fashion and a Foley catheter was placed to monitor  output at this point.  The Veress cannula was reinserted in the peritoneal  cavity with insufflation of 3 liters of carbon dioxide, and the disposable  10/11 trocar was placed in the peritoneal cavity with insertion of the  operative scope.  The probe was inserted through the same incision above the  symphysis pubis.  The patient had no unusual cardiac episodes during all of  this.  The above-noted findings were visualized.  Cytology was taken with  pelvic washings.  A number of attempts were made to elevate the uterus and  left adnexal structures which were unsuccessful.  Because of the extreme  vasculature in this area and the dense adhesions in the area of the left  ovarian cyst, it was felt that pursuing laparoscopic excision would be  hazardous.  With this finding and the fact that she had already experienced  a cardiac event, it was felt that terminating the procedure was indicated.  Accordingly, the procedure was terminated.  The instruments were removed  from the peritoneal cavity after ascertaining that all of the areas were  hemostatic after pressure had been reduced bilaterally and gas to escape.  The abdominal incisions were closed with a fascial suture of 2-0 Vicryl and  subcuticular suture of 3-0 plain catgut.  The uterine bleeding was then evaluated.  The single-tooth tenaculum and  acorn cannula were  removed from the cervix.  The patient was not bleeding at  this point.  Exploration of the endometrial cavity and curettage with a  small curette was then carried out to ensure that there was no abnormal  tissue within the uterus and that hemostasis was maintained.  This was the  case, and after observing this for several minutes it was felt safe to  terminate the entire procedure.  Estimated blood loss approximately 25 mL.  The patient was taken to the recovery room in good condition with clear  urine in the Foley catheter tubing which was removed prior to removing the  patient from the OR.  She will be placed on observation and a telemetry bed  after further  cardiac evaluation in the PACU.           ______________________________  Almedia Balls. Randell Patient, M.D.     SRF/MEDQ  D:  03/21/2006  T:  03/21/2006  Job:  [id number]

## 2010-11-26 NOTE — Consult Note (Signed)
NAMEVALINCIA, TOUCH              ACCOUNT NO.:  [id number]   MEDICAL RECORD NO.:  [id number]          PATIENT TYPE:  OIB   LOCATION:  0098                         FACILITY:  [facility]   PHYSICIAN:  Vesta Mixer, M.D. DATE OF BIRTH:  07/01/1917   DATE OF CONSULTATION:  03/21/2006  DATE OF DISCHARGE:                                   CONSULTATION   HISTORY:  [name] is an 77 year old female with a history of  chronic left bundle branch block.  She was scheduled to have a GYN surgery.  We were asked to see her, today, after she developed asystole during the  insufflation part of her laparoscopic GYN surgery.   The patient has a long history of left bundle branch block.  She was seen  preoperatively for an adenosine Cardiolite study.  The adenosine Cardiolite  study revealed no evidence of ischemia.  She has normal left ventricular  systolic function with an ejection fraction of 55%.  The left ventricle was  seen to be mildly enlarged, but the overall LV function was normal.  She  also has not noted to have any arrhythmias during the adenosine infusion.   Today, she was having insufflation of her abdomen as part of her  laparoscopic GYN surgery.  She began to have bradycardia and then developed  complete asystole.  She does appear to have some P waves buried in her  telemetry, but it is very difficult to tell.  She had no heart beat for  about 35 seconds; and then came back fairly normally.  The returning beat  appeared to be sinus in mechanism.   The patient is now awake and alert and does not recall any problems.  She  denies any chest pain or shortness of breath.   CURRENT MEDICATIONS:  1. Lipitor 10 mg a day.  2. Synthroid 0.075 mg a day.   ALLERGIES:  She is allergic to NOVOCAIN.   PAST MEDICAL HISTORY:  1. Left bundle branch block--chronic  2. Hypothyroidism.  3. Hypercholesterolemia.   SOCIAL HISTORY:  The patient quit smoking in 1968.   FAMILY HISTORY:   Noncontributory.   REVIEW OF SYSTEMS:  Reviewed and is essentially negative except as noted in  the HPI.   PHYSICAL EXAMINATION:  GENERAL:  On exam she is an elderly female in no  acute distress.  She is alert and oriented x3; and her mood and affect are  normal.  VITAL SIGNS:  Her Heart rate is 62.  Blood pressure is 144/59.  HEENT AND NECK EXAM:  Reveals 2+ carotids, no bruits, no JVD, no  thyromegaly.  LUNGS:  Clear to auscultation.  HEART:  Regular rate, S1-S2.  She has no  gallop.  ABDOMINAL EXAM:  Reveals that she is not distended.  EXTREMITIES:  She has no edema.   Telemetry strips revealed normal sinus rhythm which changed to sinus  bradycardia, and eventually sinus rhythm with complete AV block and  asystole.  She resumes her ventricular beats approximately 35 seconds later.   Her EKG intraoperatively reveals normal sinus rhythm with a left bundle  branch  block.  Postoperatively her EKG had normal sinus rhythm with  nonspecific IVCD.   Cardiac enzymes are negative x1.   IMPRESSION AND PLAN:  Bradycardia/asystole.  Most likely this is brought on  by her chronic left bundle branch block, and the superimposed increased  vagal tone caused by the insufflation during her laparoscopic surgery.  While it is fairly clear that that was the cause, I do think, that she needs  to be considered for a pacemaker.  She does have a chronic left bundle  branch block and is a risk of having worsening problems, especially if she  needs to have any further surgeries; it would be comforting to know that she  has a pacemaker.  She is currently asymptomatic.  I would like to check an  EKG in the morning.  I think that she can go home in the morning.  We will  schedule her for a pacemaker as an outpatient.  We will see her in the  office on Friday.           ______________________________  Vesta Mixer, M.D.     PJN/MEDQ  D:  03/21/2006  T:  03/22/2006  Job:  [id number]   cc:   Almedia Balls. Randell Patient, M.D.  Fax: [phone]   Antony Madura, M.D.  Fax: [phone]

## 2010-11-26 NOTE — Discharge Summary (Signed)
[name], Duarte              ACCOUNT NO.:  [id number]   MEDICAL RECORD NO.:  [id number]          PATIENT TYPE:  OIB   LOCATION:  1415                         [facility]ACILITY:  [facility]   PHYSICIAN:  Almedia Balls. Fore, M.D.   DATE OF BIRTH:  04-05-1917   DATE OF ADMISSION:  03/21/2006  DATE OF DISCHARGE:  03/22/2006                                 DISCHARGE SUMMARY   HISTORY:  The patient is an 77 year old with persistent recently enlarging  left ovarian cyst for laparoscopic excision on March 22, 2006.  She had  been evaluated with cardiac studies and found to be suitable for surgery.   LABORATORY DATA:  Preoperative hemoglobin in a dehydrated state of 14.2,  hemoglobin immediately postoperatively after rehydration 12.3. BMET panel  was normal except for glucose slightly elevated at 102.  Cardiac enzymes  were obtained following an event during surgery with the following numbers,  CK 171, CK-MB increased at 5 with normal range of 0.3-4, troponin is 0.02  with a comment no indication of myocardial injury, relative index slightly  elevated at 2.9 with the range from 0.0-2.5.   HOSPITAL COURSE:  The patient was taken to operating room on the morning of  March 21, 2006, at which time diagnostic laparoscopy and endometrial  biopsy were performed.  Following induction and placement of the instruments  initially and after insufflation of carbon dioxide intraperitoneally, the  patient experienced an episode of asystole which lasted for approximately 35  seconds.  She responded rapidly to intravenous atropine.  After extensive  evaluation intraoperatively by the anesthesiologist, Dr. Rica Mast, it was  felt that she had sustained no cardiac injury and that proceeding with the  operation would be indicated.  Accordingly, the laparoscopy was performed  with findings of the cyst in the left ovary which was quite densely adherent  to structures in the left adnexal area making it possible to  excise  laparoscopically.  Cytology was obtained with the report unavailable at this  time.  The patient also experienced some profuse uterine bleeding at the  close of the procedure so that an  endometrial biopsy was performed to  ensure that no unusual endometrial tissue was present.  She was placed on a  telemetry bed overnight and observed through the night.  She had no further  episodes of lowered heart rate or chest pain.  Electrocardiogram was to be  obtained on the morning of March 23, 2007, with results unknown at this  time.  It was felt because of her good clinical status, that she could be  discharged at this time.   FINAL DIAGNOSES:  1. Persisting enlarging left ovarian cyst.  2. Abnormal postmenopausal bleeding.  3. Asystole with variable electrical conduction defects.   OPERATION:  Diagnostic laparoscopy, extensive curettage of endometrium.  Pathology report unavailable at time of dictation.   DISPOSITION:  Discharged home to continue care with Dr. Elease Hashimoto - to be seen  in his office on March 24, 2006, or as necessary.  She is to return to  our office in approximately 2 weeks for follow-up and to call if  she  unexplained bleeding, pain or unexplained fever.  He was fully ambulatory,  on regular diet, and in good condition at the time of discharge.   DISCHARGE MEDICATIONS:  She was given prescriptions for  1. Darvocet N 100 generic #12 to be taken one half to two q.6h. p.r.n.      pain.  2. Macrobid generic #8 to be taken to STAT one b.i.d.           ______________________________  Almedia Balls. Randell Patient, M.D.     SRF/MEDQ  D:  03/22/2006  T:  03/22/2006  Job:  [id number]   cc:   Vesta Mixer, M.D.  Fax: [phone]

## 2010-11-26 NOTE — Op Note (Signed)
[name], [name]              ACCOUNT NO.:  [id number]   MEDICAL RECORD NO.:  [id number]          PATIENT TYPE:  AMB   LOCATION:  DAY                          FACILITY:  [facility]   PHYSICIAN:  Almedia Balls. Fore, M.D.   DATE OF BIRTH:  1917-04-24   DATE OF PROCEDURE:  10/18/2004  DATE OF DISCHARGE:                                 OPERATIVE REPORT   PREOPERATIVE DIAGNOSES:  Postmenopausal bleeding, question endometrial  polyp/hyperplasia.   POSTOPERATIVE DIAGNOSES:  Postmenopausal bleeding, question endometrial  polyp/hyperplasia. Pending pathology.   OPERATION:  Diagnostic hysteroscopy, fractional D&C.   ANESTHESIA:  MAC with 10 mL 1% lidocaine paracervical block.   INDICATIONS FOR PROCEDURE:  The patient is an 77 year old with  postmenopausal bleeding and abnormal appearing endometrium on ultrasound for  hysteroscopy D&C. She has been fully counseled as to the nature of the  procedure and the risks involved to include risks of anesthesia, injury to  uterus, tubes, ovaries, bowel, bladder, blood vessels, ureters,  postoperative hemorrhage, infection and recuperation. She fully understands  all these considerations and wishes to proceed on October 18, 2004 and has  signed informed consent to proceed on this date.   FINDINGS:  On bimanual exam, the uterus was mid posterior, top normal size.  There were no palpable adnexal masses. On hysteroscopy, the uterus sounded  to 9 cm. The endocervical canal was clean. Endometrial cavity had a moderate  amount of filmy appearing tissue with several polypoid appearing areas  present.   DESCRIPTION OF PROCEDURE:  With the patient under sedation, prepped and  draped in the usual sterile fashion, a speculum was placed in the vagina. A  solution of 1% lidocaine was injected at the 3, 9 and 12 o'clock positions  for a total of 10 mL for paracervical block. A single tooth tenaculum was  placed at the 12 o'clock position, and a small sharp curette was  used for  curettage of the endocervical canal. The uterus was sounded as noted above,  the cervix was dilated up to a #23 Pratt dilator. The diagnostic  hysteroscope was introduced using free flow of [place] under direct vision  with the above noted finding. The hysteroscope was removed and a medium  sharp curette and polyp forceps were used for removal of tissue from the  endometrial cavity. When no further tissue was obtained, the hysteroscope  was reemployed to ensure that all tissue had been removed and that  hemostasis was maintained. After noting that this was the case, the  procedure was terminated. Estimated blood loss less than 25 mL. The patient  was taken to the recovery room in good condition.   FOLLOW UP:  She is to return to the office in two weeks for followup and was  instructed to call if heavy bleeding, pain or unexplained fever should  ensue. She was given a prescription for Darvocet-N 100 generic #10 to be  taken 1/2 to 1 q.6h. p.r.n. pain.    SRF/MEDQ  D:  10/18/2004  T:  10/18/2004  Job:  [id number]

## 2010-11-26 NOTE — Op Note (Signed)
   [name], [name]                        ACCOUNT NO.:  [id number]   MEDICAL RECORD NO.:  [id number]                   PATIENT TYPE:  AMB   LOCATION:  ENDO                                 FACILITY:  [facility]   PHYSICIAN:  John C. Madilyn Fireman, M.D.                 DATE OF BIRTH:  Aug 24, 1916   DATE OF PROCEDURE:  05/20/2003  DATE OF DISCHARGE:                                 OPERATIVE REPORT   PROCEDURE:  Colonoscopy.   INDICATIONS FOR PROCEDURE:  Colon cancer screening in a 77 year old patient  with no recent previous screening.   DESCRIPTION OF PROCEDURE:  The patient was placed in the left lateral  decubitus position and placed on the pulse monitor with continuous low-flow  oxygen delivered by nasal cannula.  She was sedated with 62.5 mcg IV  fentanyl and 6 mg IV Versed.  The Olympus video colonoscope was inserted  into the rectum and advanced to the cecum, confirmed by transillumination of  McBurney's point and visualization of the ileocecal valve and appendiceal  orifice.  Prep was excellent.  The cecum, ascending and transverse colon all  appeared normal with no masses, polyps, diverticula, or other mucosal  abnormalities.  Within the descending sigmoid colon, there were seen several  scattered diverticula.  No other abnormalities.  The rectum was  normal and  retroflexed view of the anus revealed no obvious internal hemorrhoids.  The  scope was then withdrawn and the patient returned to the recovery room in  stable condition.  She tolerated the procedure well and there were no  immediate complications.   IMPRESSION:  Left-sided diverticulosis; otherwise normal study.                                               John C. Madilyn Fireman, M.D.    JCH/MEDQ  D:  05/20/2003  T:  05/20/2003  Job:  [id number]   cc:   Antony Madura, M.D.  [address]  [place]  [place] [address]  Fax: [phone]

## 2011-04-04 ENCOUNTER — Other Ambulatory Visit: Payer: Medicare Other | Admitting: *Deleted

## 2011-04-04 ENCOUNTER — Ambulatory Visit (INDEPENDENT_AMBULATORY_CARE_PROVIDER_SITE_OTHER): Payer: Medicare Other | Admitting: Nurse Practitioner

## 2011-04-04 ENCOUNTER — Encounter: Payer: Self-pay | Admitting: Nurse Practitioner

## 2011-04-04 DIAGNOSIS — E785 Hyperlipidemia, unspecified: Secondary | ICD-10-CM

## 2011-04-04 DIAGNOSIS — I2589 Other forms of chronic ischemic heart disease: Secondary | ICD-10-CM

## 2011-04-04 DIAGNOSIS — I255 Ischemic cardiomyopathy: Secondary | ICD-10-CM

## 2011-04-04 DIAGNOSIS — E7800 Pure hypercholesterolemia, unspecified: Secondary | ICD-10-CM

## 2011-04-04 DIAGNOSIS — E039 Hypothyroidism, unspecified: Secondary | ICD-10-CM

## 2011-04-04 DIAGNOSIS — Z79899 Other long term (current) drug therapy: Secondary | ICD-10-CM

## 2011-04-04 DIAGNOSIS — I509 Heart failure, unspecified: Secondary | ICD-10-CM

## 2011-04-04 DIAGNOSIS — I10 Essential (primary) hypertension: Secondary | ICD-10-CM

## 2011-04-04 LAB — BASIC METABOLIC PANEL
CO2: 28 mEq/L (ref 19–32)
Chloride: 105 mEq/L (ref 96–112)
Sodium: 144 mEq/L (ref 135–145)

## 2011-04-04 MED ORDER — NITROGLYCERIN 0.2 MG/HR TD PT24: 1.0000 | MEDICATED_PATCH | Freq: Every day | TRANSDERMAL | Status: DC

## 2011-04-04 MED ORDER — LEVOTHYROXINE SODIUM 75 MCG PO TABS: 75.0000 ug | ORAL_TABLET | Freq: Every day | ORAL | Status: DC

## 2011-04-04 MED ORDER — FUROSEMIDE 20 MG PO TABS: 20.0000 mg | ORAL_TABLET | Freq: Every day | ORAL | Status: DC

## 2011-04-04 MED ORDER — LISINOPRIL 2.5 MG PO TABS: 2.5000 mg | ORAL_TABLET | Freq: Every day | ORAL | Status: DC

## 2011-04-04 MED ORDER — ATORVASTATIN CALCIUM 10 MG PO TABS: 10.0000 mg | ORAL_TABLET | Freq: Every day | ORAL | Status: DC

## 2011-04-04 MED ORDER — NITROGLYCERIN 0.4 MG SL SUBL: 0.4000 mg | SUBLINGUAL_TABLET | SUBLINGUAL | Status: DC | PRN

## 2011-04-04 MED ORDER — DIGOXIN 125 MCG PO TABS: 125.0000 ug | ORAL_TABLET | Freq: Every day | ORAL | Status: DC

## 2011-04-04 NOTE — Assessment & Plan Note (Signed)
Labs are checked today.  

## 2011-04-04 NOTE — Patient Instructions (Signed)
I have refilled your medicines Continue with your current medicines. Weigh yourself each morning and record. Take extra dose of diuretic for weight gain of 3 pounds in 24 hours. Limit sodium intake.   I will see you back in 4 months. Call for any problems.

## 2011-04-04 NOTE — Progress Notes (Signed)
    [name] Date of Birth: 20-Jun-1917   History of Present Illness: [name] is seen back today for her 6 month check. She is seen for Dr. Antoine Poche. She is a former patient of Dr. Ronnald Nian. She has an ischemic cardiomyopathy with an EF of 25%. She refused cath and has been managed medically. No chest pain or shortness of breath reported. Weight is stable. No edema. She tries to stay active. She is now 77 years of age. She is losing her teeth. She still grieves over the loss of her husband. Medicines are refilled today.   Current Outpatient Prescriptions on File Prior to Visit  Medication Sig Dispense Refill  . aspirin 81 MG tablet Take 81 mg by mouth daily.        . potassium chloride (KLOR-CON) 10 MEQ CR tablet Take 10 mEq by mouth daily.        Marland Kitchen DISCONTD: atorvastatin (LIPITOR) 10 MG tablet Take 10 mg by mouth daily.        Marland Kitchen DISCONTD: digoxin (LANOXIN) 0.125 MG tablet Take 125 mcg by mouth daily.        Marland Kitchen DISCONTD: furosemide (LASIX) 20 MG tablet Take 20 mg by mouth daily.        Marland Kitchen DISCONTD: levothyroxine (SYNTHROID, LEVOTHROID) 75 MCG tablet Take 75 mcg by mouth daily.        Marland Kitchen DISCONTD: lisinopril (PRINIVIL,ZESTRIL) 2.5 MG tablet Take 2.5 mg by mouth daily.        Marland Kitchen DISCONTD: nitroGLYCERIN (NITRODUR) 0.2 mg/hr Place 1 patch onto the skin at bedtime.        Marland Kitchen DISCONTD: nitroGLYCERIN (NITROSTAT) 0.4 MG SL tablet Place 0.4 mg under the tongue every 5 (five) minutes as needed.          Allergies  Allergen Reactions  . Novocain     Past Medical History  Diagnosis Date  . Ischemic cardiomyopathy June 2009    managed medically; EF is 20 to 25%  . 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 Hypothyroidism   . Hyperlipemia   . Advanced age   . Grief     from husband's death  . Left bundle branch block   . Cardiac arrest 2007    Occurred during planned ovarian surgery that resolved spontaneously after her stomach was deflated.     History reviewed. No pertinent  past surgical history.  History  Smoking status  . Former Smoker  . Quit date: 07/11/1968  Smokeless tobacco  . Never Used    History  Alcohol Use No    Family History  Problem Relation Age of Onset  . Aneurysm Father   . Heart attack Mother     Review of Systems: The review of systems is positive for arthritis. She is not dizzy. Weight is stable.Marland Kitchen No chest pain.  All other systems were reviewed and are negative.  Physical Exam: BP 140/80  Pulse 80  Ht 5\' 6"  (1.676 m)  Wt 148 lb 6.4 oz (67.314 kg)  BMI 23.95 kg/m2 Patient is very pleasant and in no acute distress. Skin is warm and dry. Color is normal.  HEENT is unremarkable except for several teeth missing. Normocephalic/atraumatic. PERRL. Sclera are nonicteric. Neck is supple. No masses. No JVD. Lungs are clear. Cardiac exam shows a regular rate and rhythm. No S3. Abdomen is soft. Extremities are without edema. Gait and ROM are intact. No gross neurologic deficits noted.   LABORATORY DATA: PENDING  Assessment / Plan:

## 2011-04-04 NOTE — Assessment & Plan Note (Signed)
She looks compensated. She continues with medical management. I have refilled her medicines. Labs are checked today and I will see her back in 4 months. Patient is agreeable to this plan and will call if any problems develop in the interim.

## 2011-04-06 ENCOUNTER — Telehealth: Payer: Self-pay | Admitting: Nurse Practitioner

## 2011-04-06 NOTE — Telephone Encounter (Signed)
Pt states there is confusion about her Digoxin dosage.  Dr. Su Hilt was supposed to fax the blood work already to our clinic.  Please call pt back to discuss correct dosage.

## 2011-04-06 NOTE — Telephone Encounter (Signed)
Called wanting to know if we had received lab results from Dr. Budd Palmer office. Advised that we had not. She states they wanted her to increase dose of Digoxin but she wants to do what Lawson Fiscal said. Per Lawson Fiscal she needs to take Digoxin 0.125 mg daily. She states that is what she is going to do because she has done well on that dose.

## 2011-04-07 ENCOUNTER — Telehealth: Payer: Self-pay | Admitting: Cardiology

## 2011-04-07 LAB — BASIC METABOLIC PANEL
BUN: 15
CO2: 27
CO2: 30
CO2: 30
Calcium: 9.2
Chloride: 100
Chloride: 99
Creatinine, Ser: 1.07
Creatinine, Ser: 1.17
GFR calc Af Amer: 52 — ABNORMAL LOW
GFR calc Af Amer: 53 — ABNORMAL LOW
GFR calc Af Amer: 58 — ABNORMAL LOW
Potassium: 3.7
Potassium: 3.7
Sodium: 133 — ABNORMAL LOW
Sodium: 138

## 2011-04-07 LAB — DIFFERENTIAL
Eosinophils Relative: 2
Lymphocytes Relative: 23
Lymphs Abs: 1.4
Monocytes Absolute: 0.3
Monocytes Relative: 4
Neutro Abs: 4.2

## 2011-04-07 LAB — URINALYSIS, ROUTINE W REFLEX MICROSCOPIC
Glucose, UA: NEGATIVE
Ketones, ur: NEGATIVE
Leukocytes, UA: NEGATIVE
Nitrite: NEGATIVE
pH: 5

## 2011-04-07 LAB — POCT I-STAT 3, ART BLOOD GAS (G3+)
Acid-base deficit: 2
O2 Saturation: 100
pCO2 arterial: 43.5

## 2011-04-07 LAB — LIPID PANEL
Triglycerides: 107
VLDL: 21

## 2011-04-07 LAB — HEMOGLOBIN A1C
Hgb A1c MFr Bld: 5.7
Mean Plasma Glucose: 126

## 2011-04-07 LAB — CK TOTAL AND CKMB (NOT AT ARMC)
CK, MB: 6 — ABNORMAL HIGH
Relative Index: 4.7 — ABNORMAL HIGH
Total CK: 128

## 2011-04-07 LAB — CULTURE, BLOOD (ROUTINE X 2): Culture: NO GROWTH

## 2011-04-07 LAB — CBC
HCT: 37.7
HCT: 39.6
MCHC: 34.3
MCV: 90.6
MCV: 90.9
Platelets: 140 — ABNORMAL LOW
RBC: 4.17
RDW: 13.5
WBC: 6

## 2011-04-07 LAB — COMPREHENSIVE METABOLIC PANEL
AST: 35
Albumin: 3.9
BUN: 20
Creatinine, Ser: 1.09
GFR calc Af Amer: 57 — ABNORMAL LOW
Total Protein: 6.3

## 2011-04-07 LAB — B-NATRIURETIC PEPTIDE (CONVERTED LAB)
Pro B Natriuretic peptide (BNP): 223 — ABNORMAL HIGH
Pro B Natriuretic peptide (BNP): 327 — ABNORMAL HIGH
Pro B Natriuretic peptide (BNP): 474 — ABNORMAL HIGH
Pro B Natriuretic peptide (BNP): <30

## 2011-04-07 LAB — HEPATIC FUNCTION PANEL
Alkaline Phosphatase: 49
Bilirubin, Direct: 0.3
Indirect Bilirubin: 1.9 — ABNORMAL HIGH
Total Protein: 6.1

## 2011-04-07 LAB — CARDIAC PANEL(CRET KIN+CKTOT+MB+TROPI)
CK, MB: 11.2 — ABNORMAL HIGH
Total CK: 287 — ABNORMAL HIGH
Total CK: 298 — ABNORMAL HIGH
Troponin I: 0.15 — ABNORMAL HIGH

## 2011-04-07 LAB — URINE MICROSCOPIC-ADD ON

## 2011-04-07 LAB — POCT CARDIAC MARKERS: Myoglobin, poc: 79.3

## 2011-04-07 LAB — D-DIMER, QUANTITATIVE: D-Dimer, Quant: 1.13 — ABNORMAL HIGH

## 2011-04-07 NOTE — Telephone Encounter (Signed)
[name] physically brought in [name] lab results and wanted to know what she should do about her low test results. She is a Engineer, water and says that her current dose of Digoxin wouldn't be effective at that level. Please call back. I have pulled the chart.

## 2011-04-08 ENCOUNTER — Telehealth: Payer: Self-pay | Admitting: Nurse Practitioner

## 2011-04-08 NOTE — Telephone Encounter (Signed)
lm

## 2011-04-08 NOTE — Telephone Encounter (Signed)
Pt daughter wants to talk about the digoxin levels

## 2011-04-08 NOTE — Telephone Encounter (Signed)
Have left numerous messages w/daughter and Norma Fredrickson, NP left message this AM that she was not changing her digoxin dose because level needed to be low due to her CHF.

## 2011-04-08 NOTE — Telephone Encounter (Signed)
Lm

## 2011-04-19 ENCOUNTER — Emergency Department (HOSPITAL_COMMUNITY): Payer: Medicare Other

## 2011-04-19 ENCOUNTER — Inpatient Hospital Stay (HOSPITAL_COMMUNITY)
Admission: EM | Admit: 2011-04-19 | Discharge: 2011-04-21 | DRG: 065 | Disposition: A | Discharge status: Home / Self Care | Payer: Medicare Other | Attending: Internal Medicine | Admitting: Internal Medicine

## 2011-04-19 DIAGNOSIS — E039 Hypothyroidism, unspecified: Secondary | ICD-10-CM | POA: Diagnosis present

## 2011-04-19 DIAGNOSIS — R944 Abnormal results of kidney function studies: Secondary | ICD-10-CM | POA: Diagnosis present

## 2011-04-19 DIAGNOSIS — E7800 Pure hypercholesterolemia, unspecified: Secondary | ICD-10-CM | POA: Diagnosis present

## 2011-04-19 DIAGNOSIS — Z79899 Other long term (current) drug therapy: Secondary | ICD-10-CM

## 2011-04-19 DIAGNOSIS — M1710 Unilateral primary osteoarthritis, unspecified knee: Secondary | ICD-10-CM | POA: Diagnosis present

## 2011-04-19 DIAGNOSIS — Z7902 Long term (current) use of antithrombotics/antiplatelets: Secondary | ICD-10-CM

## 2011-04-19 DIAGNOSIS — R0989 Other specified symptoms and signs involving the circulatory and respiratory systems: Secondary | ICD-10-CM

## 2011-04-19 DIAGNOSIS — I6350 Cerebral infarction due to unspecified occlusion or stenosis of unspecified cerebral artery: Principal | ICD-10-CM | POA: Diagnosis present

## 2011-04-19 DIAGNOSIS — Z87891 Personal history of nicotine dependence: Secondary | ICD-10-CM

## 2011-04-19 DIAGNOSIS — I5022 Chronic systolic (congestive) heart failure: Secondary | ICD-10-CM | POA: Diagnosis present

## 2011-04-19 DIAGNOSIS — E785 Hyperlipidemia, unspecified: Secondary | ICD-10-CM | POA: Diagnosis present

## 2011-04-19 DIAGNOSIS — I509 Heart failure, unspecified: Secondary | ICD-10-CM | POA: Diagnosis present

## 2011-04-19 LAB — PROTIME-INR
INR: 1.04 (ref 0.00–1.49)
Prothrombin Time: 13.8 seconds (ref 11.6–15.2)

## 2011-04-19 LAB — POCT I-STAT TROPONIN I: Troponin i, poc: 0.02 ng/mL (ref 0.00–0.08)

## 2011-04-19 LAB — DIFFERENTIAL
Basophils Absolute: 0 10*3/uL (ref 0.0–0.1)
Basophils Relative: 0 % (ref 0–1)
Eosinophils Relative: 1 % (ref 0–5)
Monocytes Absolute: 0.7 10*3/uL (ref 0.1–1.0)
Neutro Abs: 4.5 10*3/uL (ref 1.7–7.7)

## 2011-04-19 LAB — COMPREHENSIVE METABOLIC PANEL
Albumin: 4 g/dL (ref 3.5–5.2)
BUN: 20 mg/dL (ref 6–23)
Creatinine, Ser: 0.86 mg/dL (ref 0.50–1.10)
Total Protein: 6.7 g/dL (ref 6.0–8.3)

## 2011-04-19 LAB — URINALYSIS, ROUTINE W REFLEX MICROSCOPIC
Nitrite: NEGATIVE
Specific Gravity, Urine: 1.004 — ABNORMAL LOW (ref 1.005–1.030)
Urobilinogen, UA: 0.2 mg/dL (ref 0.0–1.0)

## 2011-04-19 LAB — CBC
Hemoglobin: 12.6 g/dL (ref 12.0–15.0)
MCHC: 33.1 g/dL (ref 30.0–36.0)
RDW: 13.3 % (ref 11.5–15.5)

## 2011-04-20 ENCOUNTER — Inpatient Hospital Stay (HOSPITAL_COMMUNITY): Payer: Medicare Other

## 2011-04-20 ENCOUNTER — Other Ambulatory Visit (HOSPITAL_COMMUNITY): Payer: Medicare Other

## 2011-04-20 DIAGNOSIS — I517 Cardiomegaly: Secondary | ICD-10-CM

## 2011-04-20 LAB — GLUCOSE, CAPILLARY
Glucose-Capillary: 89 mg/dL (ref 70–99)
Glucose-Capillary: 82 mg/dL (ref 70–99)

## 2011-04-20 LAB — TSH: TSH: 2.292 u[IU]/mL (ref 0.350–4.500)

## 2011-04-20 LAB — LIPID PANEL
Cholesterol: 162 mg/dL (ref 0–200)
VLDL: 31 mg/dL (ref 0–40)

## 2011-04-21 LAB — BASIC METABOLIC PANEL
BUN: 26 mg/dL — ABNORMAL HIGH (ref 6–23)
CO2: 25 mEq/L (ref 19–32)
Calcium: 9.3 mg/dL (ref 8.4–10.5)
Creatinine, Ser: 1.13 mg/dL — ABNORMAL HIGH (ref 0.50–1.10)
Glucose, Bld: 95 mg/dL (ref 70–99)

## 2011-04-21 LAB — CBC
MCV: 90.1 fL (ref 78.0–100.0)
Platelets: 134 10*3/uL — ABNORMAL LOW (ref 150–400)
RDW: 13.5 % (ref 11.5–15.5)
WBC: 5.3 10*3/uL (ref 4.0–10.5)

## 2011-04-21 LAB — GLUCOSE, CAPILLARY: Glucose-Capillary: 120 mg/dL — ABNORMAL HIGH (ref 70–99)

## 2011-05-03 NOTE — Discharge Summary (Signed)
[name], [name]              ACCOUNT NO.:  [id number]  MEDICAL RECORD NO.:  [id number]  LOCATION:  3015                         FACILITY:  [facility]  PHYSICIAN:  Osvaldo Shipper, MD     DATE OF BIRTH:  August 03, 1916  DATE OF ADMISSION:  04/19/2011 DATE OF DISCHARGE:  04/21/2011                              DISCHARGE SUMMARY   PRIMARY CARE PHYSICIAN:  Dr. Su Hilt.  CONSULTATION DURING THIS HOSPITALIZATION:  None.  CARDIOLOGIST:  Dr. Antoine Poche.  IMAGING STUDIES DONE DURING THIS HOSPITALIZATION: 1. Chest x-ray which shows stable cardiomegaly without failure, mild     emphysema and probable pulmonary arterial hypertension. 2. CT of the head showed mild age related atrophy less than often seen     in persons of this age.  No acute insult was noted in the CT. 3. MRI of the brain showed mild chronic microvascular ischemic     changes.  A possible small focus of acute infarction in the right     hippocampus.  They also said that this could represent artifact. 4. MRA of the head was done which showed mild small-vessel disease     involving distal left MCA and bilateral PCA branches.  Right MCA     branch appeared to be relatively normal.  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  OTHER STUDIES DONE:  Include an echocardiogram which showed EF of 25-30% with some inferior hypokinesis, anteroseptal akinesis and apical dyskinesis.  This is similar to an echo done in March, 2012.  The patient also had carotid Dopplers which did not show any significant stenosis.  PERTINENT LABS:  Include a platelet count of 138.  Renal function was normal at admission, today it is 26 and 1.13, requiring followup as an outpatient.  HDL 51, LDL 80.  TSH is 2.29.  Digoxin level 0.7.  DISCHARGE DIAGNOSES: 1. Possible acute stroke, stable. 2. Known history of chronic systolic congestive heart failure,     compensated. 3. Mildly elevated BUN and creatinine,  requiring outpatient followup. 4. History of hypothyroidism, stable.  BRIEF HOSPITAL COURSE:  Briefly, this is a 77 year old Caucasian female who presented to the hospital with a 5-10 minutes episode of slurred speech and confusion.  The patient was evaluated in the hospital and there is a suspicion of a stroke based on the MRI.  The patient was on aspirin at home.  This was switched over to Plavix.  She is already on a statin medication.  EKG was done which did not suggest AFib. Echocardiogram shows stable findings from earlier this year.  There was no other significant stenosis that needed to be intervened upon.  So, at this point, the patient has been seen by PT and OT and they are really no recommendations for home health unless the patient wanted home health for her right knee which she declines.  She does have chronic arthritis in the right knee.  Rest of her medical issues are all stable.  She will need to follow up with her primary care physician.  On the day of discharge, the patient is feeling well.  She is keen on going home.  PHYSICAL  EXAMINATION:  VITAL SIGNS:  Are all stable.  Blood pressure 110/62, saturation 96% on room air. LUNGS:  Clear to auscultation bilaterally with no wheezing, rales, or rhonchi. CARDIOVASCULAR:  S1 and S2 is normal, regular.  No S3-S4, rubs, murmurs or bruits.  No pedal edema. ABDOMEN:  Soft. NEUROLOGIC:  There are no focal neurological deficits.  DISCHARGE MEDICATIONS:  Are as follows, 1. Plavix 75 mg p.o. daily. 2. Digoxin 0.125 mg p.o. daily. 3. Furosemide 20 mg p.o. daily. 4. Levothyroxine 75 mcg daily. 5. Lipitor 10 mg daily at bedtime. 6. Lisinopril 2.5 mg daily. 7. Nitroglycerin patch 0.2 mg/hour apply at 9 a.m. and remove at 5:00     p.m. daily. 8. Nitroglycerin sublingually as needed for chest pain, q.5 minutes     x3. 9. Potassium chloride 10 mEq p.o. daily. 10.They have asked her to stop her aspirin.  FOLLOWUP: 1. With Dr.  Su Hilt her PCP in 1-2 weeks. 2. BMET will be repeated middle of next week because of the mild     elevation noted today.  She is, however, making urine and does not have any other symptoms at this time.  DIET:  Heart healthy.  ACTIVITY:  Physical activity as tolerated.  Total time on this discharge encounter was 35 minutes.  Osvaldo Shipper, MD     GK/MEDQ  D:  04/21/2011  T:  04/21/2011  Job:  [id number]  cc:   Dr. Bradly Chris, MD, [facility]  Electronically Signed by Osvaldo Shipper MD on 05/03/2011 07:10:19 PM

## 2011-05-04 ENCOUNTER — Telehealth: Payer: Self-pay | Admitting: Nurse Practitioner

## 2011-05-04 NOTE — Telephone Encounter (Signed)
Advised patient just Plavix for now

## 2011-05-04 NOTE — Telephone Encounter (Signed)
Hospital took off ASA, should she be on both ASA and Plavix?

## 2011-05-04 NOTE — Telephone Encounter (Signed)
I looked at the papers she dropped off yesterday at the front desk. Looked like Plavix was indicated due to stroke. That is ok to take.

## 2011-05-04 NOTE — Telephone Encounter (Signed)
Tried to call patient, left message.  Is there any reason she should not be on Plavix?

## 2011-05-04 NOTE — Telephone Encounter (Signed)
Pt rtn call she is hard of hearing

## 2011-05-04 NOTE — Telephone Encounter (Signed)
Hospital changed pt aspirin to plavix. Pt would like to know what Lawson Fiscal recommends. Please return pt call to discuss further.

## 2011-05-04 NOTE — Telephone Encounter (Signed)
Looked like just Plavix to me.

## 2011-05-11 NOTE — H&P (Signed)
[name], [name]              ACCOUNT NO.:  [id number]  MEDICAL RECORD NO.:  [id number]  LOCATION:  [facility]                         FACILITY:  [facility]  PHYSICIAN:  Ladell Pier, M.D.   DATE OF BIRTH:  Dec 28, 1916  DATE OF ADMISSION:  04/19/2011 DATE OF DISCHARGE:                             HISTORY & PHYSICAL   CHIEF COMPLAINT:  The patient was brought in by her daughter secondary to a bout of confusion.  HISTORY OF PRESENT ILLNESS:  The patient is a 77 year old white female with past medical history significant for CHF, hyperlipidemia, hypothyroidism.  The patient stated that she was at home today between 60 and 1 that she knew that she had an appointment with Dr. Warren Danes to get a root canal and at home she had this period where she remembered that she had the appointment but she could not remember what it was for, which was very unusual.  Her daughter came to get her for the appointment and noted that she was confused and just could not remember why she had to see the doctor.  He did not have any headache.  No slurred speech.  No vision changes.  Her daughter brought her to the emergency room thinking she was having a TIA.  She had a CT scan done in the emergency room that was negative and per Dr. Penelope Coop, she had an MRI done that showed a possible small, I think temporal lobe stroke, so we were asked to admit the patient for further workup.  PAST MEDICAL HISTORY:  Significant for: 1. Hypothyroidism. 2. Hyperlipidemia. 3. Congestive heart failure with LVEF 20-25% in 2009.  History of D and C in 2007, with cardiac arrest during that procedure that spontaneously resolved.  FAMILY HISTORY:  Mother died at 47 from a ruptured aneurysm.  Father died at 25 from a myocardial infarction.  SOCIAL HISTORY:  The patient is widowed for 2 years.  She is retired from [employer].  She quit tobacco 62 years now.  No alcohol use. She has 2 children.  MEDICATIONS: 1. Aspirin 81 mg  daily. 2. Digoxin daily. 3. Furosemide daily. 4. Lipitor daily. 5. Lisinopril daily. 6. Nitro-Dur daily. 7. Potassium chloride daily. 8. Synthroid daily doses to be brought in by her daughter.  ALLERGIES:  NOVOCAIN.  REVIEW OF SYSTEMS:  Negative, otherwise stated in HPI.  PHYSICAL EXAMINATION:  VITAL SIGNS:  Temperature 98.6, blood pressure 122/74, pulse 82, respirations 18, pulse ox 99% on room air. GENERAL:  The patient is sitting up on the stretcher, well-nourished white female, does not seem to be in any acute distress.  HEENT: Normocephalic, atraumatic.  Pupils reactive to light.  Throat is without erythema. CARDIOVASCULAR:  Regular rate and rhythm. LUNGS:  Clear bilaterally. ABDOMEN:  Soft, nontender, nondistended.  Positive bowel sounds. EXTREMITIES:  Without edema. NEURO:  Nonfocal.  Strength 5/5 throughout.  Finger-to-nose intact. Cranial nerves 2-12 intact.  LABORATORY DATA:  CT scan of the head, mild age related atrophy, otherwise negative.  Urinalysis negative.  Her CMP; sodium 140, potassium 4.0, chloride 102, CO2 is 26, glucose 94, BUN 20, creatinine 0.86.  LFT's are normal.  WBC 6.9, hemoglobin 12.6, MCV 90.7, platelet of 138.  ASSESSMENT AND PLAN: 1. Question of acute cerebrovascular accident. 2. Hypothyroidism. 3. Hyperlipidemia. 4. Congestive heart failure, the patient will be admitted to the     hospital for stroke workup.  She was on 81 mg of aspirin at home.     We will increase her to 325 daily.  We will defer to rounding     physician whether to get a neurology consult or not, we will get     PT/OT consults.  Once her medications are verified, then we will     continue her on her home medications with her blood pressure be     normal, we will hold her lisinopril for now and continue her other     medications.  Time spent with the patient in doing this admission is approximately 45 minutes.     Ladell Pier, M.D.     N[place]/MEDQ  D:   04/19/2011  T:  04/19/2011  Job:  [id number]  cc:   Dr. Charolotte Eke N. Deborah Chalk, M.D.  Electronically Signed by Ladell Pier M.D. on 05/11/2011 09:53:11 PM

## 2011-05-30 ENCOUNTER — Telehealth: Payer: Self-pay | Admitting: Cardiology

## 2011-05-30 NOTE — Telephone Encounter (Signed)
Pt only taking Plavix - she is aware OK to hold per Lawson Fiscal.

## 2011-05-30 NOTE — Telephone Encounter (Signed)
She should not be on both coumadin and Plavix. I believe she is only on Plavix. That is ok to stop for her tooth extraction per the dentist.

## 2011-05-30 NOTE — Telephone Encounter (Signed)
Last week a tooth broke off at the gum and pt needs to have roots taken out and they have to stop her coumadin and Plavix and she wants to make sure that is ok

## 2011-06-06 ENCOUNTER — Encounter: Payer: Self-pay | Admitting: Nurse Practitioner

## 2011-06-08 ENCOUNTER — Encounter: Payer: Self-pay | Admitting: Nurse Practitioner

## 2011-06-09 ENCOUNTER — Emergency Department (HOSPITAL_COMMUNITY)
Admission: EM | Admit: 2011-06-09 | Discharge: 2011-06-10 | Disposition: A | Discharge status: Home / Self Care | Payer: Medicare Other | Attending: Emergency Medicine | Admitting: Emergency Medicine

## 2011-06-09 ENCOUNTER — Encounter (HOSPITAL_COMMUNITY): Payer: Self-pay | Admitting: Adult Health

## 2011-06-09 DIAGNOSIS — Z79899 Other long term (current) drug therapy: Secondary | ICD-10-CM | POA: Insufficient documentation

## 2011-06-09 DIAGNOSIS — K529 Noninfective gastroenteritis and colitis, unspecified: Secondary | ICD-10-CM

## 2011-06-09 DIAGNOSIS — E039 Hypothyroidism, unspecified: Secondary | ICD-10-CM | POA: Insufficient documentation

## 2011-06-09 DIAGNOSIS — R231 Pallor: Secondary | ICD-10-CM | POA: Insufficient documentation

## 2011-06-09 DIAGNOSIS — K5289 Other specified noninfective gastroenteritis and colitis: Secondary | ICD-10-CM | POA: Insufficient documentation

## 2011-06-09 DIAGNOSIS — Z8673 Personal history of transient ischemic attack (TIA), and cerebral infarction without residual deficits: Secondary | ICD-10-CM | POA: Insufficient documentation

## 2011-06-09 DIAGNOSIS — I509 Heart failure, unspecified: Secondary | ICD-10-CM | POA: Insufficient documentation

## 2011-06-09 HISTORY — DX: Left ventricular failure, unspecified: I50.1

## 2011-06-09 HISTORY — DX: Cerebral infarction, unspecified: I63.9

## 2011-06-09 LAB — COMPREHENSIVE METABOLIC PANEL
ALT: 12 U/L (ref 0–35)
Alkaline Phosphatase: 69 U/L (ref 39–117)
BUN: 21 mg/dL (ref 6–23)
CO2: 25 mEq/L (ref 19–32)
Chloride: 100 mEq/L (ref 96–112)
GFR calc Af Amer: 58 mL/min — ABNORMAL LOW (ref >90)
Glucose, Bld: 98 mg/dL (ref 70–99)
Potassium: 3.7 mEq/L (ref 3.5–5.1)
Sodium: 136 mEq/L (ref 135–145)
Total Bilirubin: 1.9 mg/dL — ABNORMAL HIGH (ref 0.3–1.2)

## 2011-06-09 LAB — CBC
HCT: 40.8 % (ref 36.0–46.0)
Hemoglobin: 13.6 g/dL (ref 12.0–15.0)
MCHC: 33.3 g/dL (ref 30.0–36.0)
RBC: 4.51 MIL/uL (ref 3.87–5.11)

## 2011-06-09 LAB — URINALYSIS, ROUTINE W REFLEX MICROSCOPIC
Bilirubin Urine: NEGATIVE
Glucose, UA: NEGATIVE mg/dL
Specific Gravity, Urine: 1.019 (ref 1.005–1.030)
pH: 5 (ref 5.0–8.0)

## 2011-06-09 LAB — DIFFERENTIAL
Basophils Relative: 0 % (ref 0–1)
Lymphocytes Relative: 12 % (ref 12–46)
Lymphs Abs: 0.8 10*3/uL (ref 0.7–4.0)
Monocytes Absolute: 0.3 10*3/uL (ref 0.1–1.0)
Monocytes Relative: 4 % (ref 3–12)
Neutro Abs: 5.5 10*3/uL (ref 1.7–7.7)

## 2011-06-09 LAB — URINE MICROSCOPIC-ADD ON

## 2011-06-09 MED ORDER — SODIUM CHLORIDE 0.9 % IV BOLUS (SEPSIS)
250.0000 mL | Freq: Once | INTRAVENOUS | Status: AC
  Administered 2011-06-09: 250 mL via INTRAVENOUS

## 2011-06-09 MED ORDER — ONDANSETRON 8 MG PO TBDP
8.0000 mg | ORAL_TABLET | Freq: Once | ORAL | Status: AC
  Administered 2011-06-09: 8 mg via ORAL
  Filled 2011-06-09: qty 1

## 2011-06-09 NOTE — ED Notes (Signed)
Reports nausea, vomitting and diarrhea for one day. Given phenergan at PCP with no relief.

## 2011-06-09 NOTE — ED Provider Notes (Signed)
History     CSN: [id number] Arrival date & time: 06/09/2011  5:47 PM   First MD Initiated Contact with Patient 06/09/11 2126      Chief Complaint  Patient presents with  . Nausea    (Consider location/radiation/quality/duration/timing/severity/associated sxs/prior treatment) HPI Comments: [name] developed acute onset nausea, vomiting, and diarrhea.  This afternoon.  She was seen by Dr. Leanord Duarte in his office given an injection of Phenergan, which helped for short period of time came to the emergency room when she again developed nausea denies sick contacts, chest pain, shortness of breath, diaphoresis,  done at the bedside is concerned she may become dehydrated due to her Lasix use for a history of CHF.  Patient is concerned about her heart and her potassium level.  She states she vomited copious amounts uncontrollably  for a short period of time.  No vomiting since approximately 4 PM no further bowel movement since that time either  The history is provided by the patient.    Past Medical History  Diagnosis Date  . Ischemic cardiomyopathy June 2009    managed medically; EF is 20 to 25%  . 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 Hypothyroidism   . Hyperlipemia   . Advanced age   . Grief     from husband's death  . Left bundle branch block   . Cardiac arrest 2007    Occurred during planned ovarian surgery that resolved spontaneously after her stomach was deflated.   . CHF (congestive heart failure)   . Stroke   . Left heart failure     No past surgical history on file.  Family History  Problem Relation Age of Onset  . Aneurysm Father   . Heart attack Mother     History  Substance Use Topics  . Smoking status: Former Smoker    Quit date: 07/11/1968  . Smokeless tobacco: Never Used  . Alcohol Use: No    OB History    Grav Para Term Preterm Abortions TAB SAB Ect Mult Living                  Review of Systems  Constitutional: Positive for appetite  change. Negative for fever, chills, diaphoresis, activity change and fatigue.  HENT: Negative for hearing loss, ear pain and rhinorrhea.   Eyes: Negative.   Respiratory: Negative.   Cardiovascular: Negative.   Gastrointestinal: Positive for nausea, vomiting and diarrhea. Negative for abdominal pain.  Genitourinary: Negative for dysuria and frequency.  Musculoskeletal: Negative.   Skin: Negative.   Neurological: Negative for dizziness, weakness and light-headedness.  Hematological: Negative.   Psychiatric/Behavioral: Negative.     Allergies  Novocain  Home Medications   Current Outpatient Rx  Name Route Sig Dispense Refill  . ATORVASTATIN CALCIUM 10 MG PO TABS Oral Take 1 tablet (10 mg total) by mouth daily. 30 tablet 11  . CLOPIDOGREL BISULFATE 75 MG PO TABS Oral Take 75 mg by mouth daily.      Marland Kitchen DIGOXIN 0.125 MG PO TABS Oral Take 1 tablet (125 mcg total) by mouth daily. 30 tablet 11  . FUROSEMIDE 20 MG PO TABS Oral Take 1 tablet (20 mg total) by mouth daily. 30 tablet 11  . LEVOTHYROXINE SODIUM 75 MCG PO TABS Oral Take 1 tablet (75 mcg total) by mouth daily. 30 tablet 11  . LISINOPRIL 2.5 MG PO TABS Oral Take 1 tablet (2.5 mg total) by mouth daily. 30 tablet 11  . NITROGLYCERIN 0.2 MG/HR TD PT24  Transdermal Place 1 patch (0.2 mg total) onto the skin at bedtime. 30 patch 11  . POTASSIUM CHLORIDE 10 MEQ PO TBCR Oral Take 10 mEq by mouth daily.      Marland Kitchen NITROGLYCERIN 0.4 MG SL SUBL Sublingual Place 0.4 mg under the tongue every 5 (five) minutes as needed. CHEST PAIN.     Marland Kitchen ONDANSETRON HCL 4 MG PO TABS Oral Take 1 tablet (4 mg total) by mouth every 6 (six) hours. 12 tablet 0    BP 130/69  Pulse 81  Temp(Src) 98.4 F (36.9 C) (Oral)  Resp 16  SpO2 97%  Physical Exam  Constitutional: She is oriented to person, place, and time. She appears well-developed.  HENT:  Head: Normocephalic.  Eyes: EOM are normal.  Neck: Neck supple.  Cardiovascular: Normal rate and regular rhythm.     Pulmonary/Chest: Breath sounds normal.  Abdominal: Soft. Bowel sounds are normal. She exhibits no distension. There is no tenderness.  Neurological: She is oriented to person, place, and time.  Skin: Skin is warm and dry. There is pallor.  Psychiatric: She has a normal mood and affect.    ED Course  Procedures (including critical care time)  Labs Reviewed  CBC - Abnormal; Notable for the following:    Platelets 138 (*)    All other components within normal limits  DIFFERENTIAL - Abnormal; Notable for the following:    Neutrophils Relative 83 (*)    All other components within normal limits  URINALYSIS, ROUTINE W REFLEX MICROSCOPIC - Abnormal; Notable for the following:    APPearance CLOUDY (*)    Hgb urine dipstick MODERATE (*)    All other components within normal limits  COMPREHENSIVE METABOLIC PANEL - Abnormal; Notable for the following:    Total Bilirubin 1.9 (*)    GFR calc non Af Amer 50 (*)    GFR calc Af Amer 58 (*)    All other components within normal limits  URINE MICROSCOPIC-ADD ON   No results found.   1. Gastroenteritis     Discusses lab findings Patient was give IV fluids and is tolerating PO fluids with no further episodes of vomiting or diarrhea   MDM  Viral illness, electrolyte imbalance         Arman Filter, NP 06/10/11 0143  Arman Filter, NP 06/10/11 0202

## 2011-06-09 NOTE — ED Notes (Signed)
St's she was at her doctor's office earlier to have fluid drained out of her ears and they gave her a shot of Phenergan because she felt nauseous then.  St's after the Phenergan wore off she became very nauseous, vomited copious amounts and also complains of diarrhea.  St's she hasn't been able to keep anything down.

## 2011-06-09 NOTE — ED Provider Notes (Signed)
11:54 PM I have seen and evaluated the patient face-to-face and at this time she is awake, alert, and oriented appropriately in no apparent distress. She relates that her symptoms have all but resolved, with complaining only of mild abdominal distention and discomfort, cramping, but tolerable. She has no further nausea and is taking oral fluids without difficulty with no further vomiting. She is being given IV fluids for rehydration and her laboratory studies are non-provocative. On auscultation she has slightly hyperactive bowel sounds but normal in character, and no significant abdominal distention and no tenderness to the abdomen. The patient appears stable for discharge home with a diagnosis of likely viral gastroenteritis.  Evaluation and management procedures were performed by the PA/NP under my supervision/collaboration.    Felisa Bonier, MD 06/09/11 [phone]

## 2011-06-09 NOTE — ED Notes (Signed)
MD at bedside. 

## 2011-06-10 MED ORDER — ONDANSETRON HCL 4 MG PO TABS: 4.0000 mg | ORAL_TABLET | Freq: Four times a day (QID) | ORAL | Status: DC

## 2011-06-10 MED ORDER — ONDANSETRON HCL 4 MG PO TABS: 4.0000 mg | ORAL_TABLET | Freq: Four times a day (QID) | ORAL | Status: AC

## 2011-06-10 NOTE — ED Notes (Signed)
Dtr called.  Pt to get fluid challenge and possible d/c home.  Pt given fluids to drink at this time. Pt comfortable.

## 2011-06-10 NOTE — Discharge Instructions (Signed)
B.R.A.T. Diet    Your doctor has recommended the B.R.A.T. diet for you or your child until the condition improves. This is often used to help control diarrhea and vomiting symptoms. If you or your child can tolerate clear liquids, you may have:  · Bananas.   · Rice.   · Applesauce.   · Toast (and other simple starches such as crackers, potatoes, noodles).   Be sure to avoid dairy products, meats, and fatty foods until symptoms are better. Fruit juices such as apple, grape, and prune juice can make diarrhea worse. Avoid these. Continue this diet for 2 days or as instructed by your caregiver.  Document Released: 06/27/2005 Document Revised: 03/09/2011 Document Reviewed: 12/14/2006  Exit[employer]® Patient Information ©2012 [employer].

## 2011-06-13 ENCOUNTER — Telehealth: Payer: Self-pay | Admitting: Cardiology

## 2011-06-13 MED ORDER — CLOPIDOGREL BISULFATE 75 MG PO TABS: 75.0000 mg | ORAL_TABLET | Freq: Every day | ORAL | Status: DC

## 2011-06-13 NOTE — Telephone Encounter (Signed)
New message:  Pt was in hosp in October and d/c ASA and started on Plavix.  She went to get her Plavix filled and will need a new prescription.  She has not seen Hochrein yet, old [place] patient.  Call Karin Golden at [place], please call patient back and let her know.

## 2011-06-13 NOTE — ED Provider Notes (Signed)
Evaluation and management procedures were performed by the PA/NP under my supervision/collaboration.  I have seen and evaluated the patient face-to-face.  Please see my note dated at the time of the patient encounter.  Felisa Bonier, MD 06/13/11 [phone]

## 2011-06-28 ENCOUNTER — Other Ambulatory Visit: Payer: Self-pay | Admitting: Nurse Practitioner

## 2011-06-28 MED ORDER — POTASSIUM CHLORIDE 10 MEQ PO TBCR: 10.0000 meq | EXTENDED_RELEASE_TABLET | Freq: Every day | ORAL | Status: DC

## 2011-08-04 ENCOUNTER — Ambulatory Visit (INDEPENDENT_AMBULATORY_CARE_PROVIDER_SITE_OTHER): Payer: Medicare Other | Admitting: Nurse Practitioner

## 2011-08-04 ENCOUNTER — Encounter: Payer: Self-pay | Admitting: Nurse Practitioner

## 2011-08-04 VITALS — BP 118/78 | HR 82 | Ht 66.0 in | Wt 148.0 lb

## 2011-08-04 DIAGNOSIS — I2589 Other forms of chronic ischemic heart disease: Secondary | ICD-10-CM

## 2011-08-04 DIAGNOSIS — I639 Cerebral infarction, unspecified: Secondary | ICD-10-CM | POA: Insufficient documentation

## 2011-08-04 DIAGNOSIS — I255 Ischemic cardiomyopathy: Secondary | ICD-10-CM

## 2011-08-04 DIAGNOSIS — E039 Hypothyroidism, unspecified: Secondary | ICD-10-CM

## 2011-08-04 DIAGNOSIS — I6350 Cerebral infarction due to unspecified occlusion or stenosis of unspecified cerebral artery: Secondary | ICD-10-CM

## 2011-08-04 NOTE — Progress Notes (Signed)
[name] Date of Birth: 01-16-1917 Medical Record #[id number]  History of Present Illness: [name] is seen today for her follow up visit. She is seen for Dr. Antoine Poche. She is a former patient of Dr. Ronnald Nian. She has LV dysfunction with an EF around 25 to 30%. She has been managed medically and conservatively for the last several years. She will be turning 89 next week.   She comes in today. She is doing well. She has no symptoms. No chest pain. Not short of breath. Remains very sharp mentally. She did have a "light stroke" back in the fall and is now on Plavix. No recurrent spells. No CHF symptoms. She continues to drive.   Current Outpatient Prescriptions on File Prior to Visit  Medication Sig Dispense Refill  . atorvastatin (LIPITOR) 10 MG tablet Take 1 tablet (10 mg total) by mouth daily.  30 tablet  11  . clopidogrel (PLAVIX) 75 MG tablet Take 1 tablet (75 mg total) by mouth daily.  30 tablet  3  . digoxin (LANOXIN) 0.125 MG tablet Take 1 tablet (125 mcg total) by mouth daily.  30 tablet  11  . furosemide (LASIX) 20 MG tablet Take 1 tablet (20 mg total) by mouth daily.  30 tablet  11  . levothyroxine (SYNTHROID, LEVOTHROID) 75 MCG tablet Take 1 tablet (75 mcg total) by mouth daily.  30 tablet  11  . lisinopril (PRINIVIL,ZESTRIL) 2.5 MG tablet Take 1 tablet (2.5 mg total) by mouth daily.  30 tablet  11  . nitroGLYCERIN (NITRODUR - DOSED IN MG/24 HR) 0.2 mg/hr Place 1 patch (0.2 mg total) onto the skin at bedtime.  30 patch  11  . nitroGLYCERIN (NITROSTAT) 0.4 MG SL tablet Place 0.4 mg under the tongue every 5 (five) minutes as needed. CHEST PAIN.       Marland Kitchen potassium chloride (KLOR-CON) 10 MEQ CR tablet Take 1 tablet (10 mEq total) by mouth daily.  30 tablet  6    Allergies  Allergen Reactions  . Novocain Hypertension    PASSED OUT    Past Medical History  Diagnosis Date  . Ischemic cardiomyopathy June 2009    managed medically; EF is 20 to 25%  . 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 Hypothyroidism   . Hyperlipemia   . Advanced age   . Grief     from husband's death  . Left bundle branch block   . Cardiac arrest 2007    Occurred during planned ovarian surgery that resolved spontaneously after her stomach was deflated.   . Stroke   . Left heart failure     EF is 20 to 25%; She is managed conservatively  . CVA (cerebral infarction) Oct 2010    "light stroke". Plavix started.    History reviewed. No pertinent past surgical history.  History  Smoking status  . Former Smoker  . Quit date: 07/11/1968  Smokeless tobacco  . Never Used    History  Alcohol Use No    Family History  Problem Relation Age of Onset  . Aneurysm Father   . Heart attack Mother     Review of Systems: The review of systems is per the HPI. She will be needing a tooth/root extracted with Dr. Warren Danes.  She has her labs with her PCP. All other systems were reviewed and are negative.  Physical Exam: BP 118/78  Pulse 82  Ht 5\' 6"  (1.676 m)  Wt 148 lb (67.132 kg)  BMI 23.89 kg/m2  Patient is very pleasant and in no acute distress. She looks younger than her stated age. Skin is warm and dry. Color is normal.  HEENT is unremarkable. Normocephalic/atraumatic. PERRL. Sclera are nonicteric. Neck is supple. No masses. No JVD. Lungs are clear. Cardiac exam shows a regular rate and rhythm. She does have a positive S3. Abdomen is soft. Extremities are without edema. Gait and ROM are intact. No gross neurologic deficits noted.   LABORATORY DATA:   Assessment / Plan:

## 2011-08-04 NOTE — Patient Instructions (Signed)
I think you are doing well.  You may stop your Plavix for 2 days to get your tooth pulled per Dr. Warren Danes.  I will have you see Dr. Antoine Poche in 4 months.  Call the [facility] office at [phone] if you have any questions, problems or concerns.

## 2011-08-04 NOTE — Assessment & Plan Note (Signed)
She has an EF of 25 to 30%. She has opted for medical and conservatively management for the past several years. She continues to do remarkably well. We will continue with her current regimen. She has her labs with her PCP. I will have her see Dr. Antoine Poche in 4 months. Patient is agreeable to this plan and will call if any problems develop in the interim.

## 2011-08-04 NOTE — Assessment & Plan Note (Signed)
Remains on replacement.

## 2011-08-04 NOTE — Assessment & Plan Note (Signed)
Prior light stroke back in the fall. No residual deficits. She is on Plavix. She may hold her Plavix for 2 days for her tooth/root extraction per Dr. Bubba Camp request.

## 2011-09-15 ENCOUNTER — Other Ambulatory Visit: Payer: Self-pay | Admitting: *Deleted

## 2011-09-15 MED ORDER — CLOPIDOGREL BISULFATE 75 MG PO TABS: 75.0000 mg | ORAL_TABLET | Freq: Every day | ORAL | Status: DC

## 2011-10-05 ENCOUNTER — Telehealth: Payer: Self-pay | Admitting: Nurse Practitioner

## 2011-10-05 NOTE — Telephone Encounter (Signed)
Her aspirin was discontinued at the time of her stroke. I would just stay with the Plavix. Thanks

## 2011-10-05 NOTE — Telephone Encounter (Signed)
PT WANTING TO KNOW IF MAY RESUME BABY ASA WAS STOPPED WHEN PLAVIX WAS INITIATED  AT HOSPITAL  BACK IN 10 -12  FORGOT TO ASK LORI AT  LAST VISIT PT AWARE WILL FORWARD TO LORI FOR REVIEW .Zack Seal

## 2011-10-05 NOTE — Telephone Encounter (Signed)
PT AWARE  TO ONLY TAKE PLAVIX AT THIS TIME AND TO CONT TO HOLD ASA./CY

## 2011-10-05 NOTE — Telephone Encounter (Signed)
Please return call to patient [phone]  Patient would like to speak with Dawayne Patricia. questions regarding medication instructions.  Will schedule with Hochrein as requested for F/U

## 2011-10-05 NOTE — Telephone Encounter (Signed)
PHONE BUSY X1 ./CY 

## 2011-10-13 ENCOUNTER — Other Ambulatory Visit: Payer: Self-pay | Admitting: Nurse Practitioner

## 2011-11-09 ENCOUNTER — Ambulatory Visit: Payer: Medicare Other | Admitting: Cardiology

## 2011-11-15 ENCOUNTER — Ambulatory Visit (INDEPENDENT_AMBULATORY_CARE_PROVIDER_SITE_OTHER): Payer: Medicare Other | Admitting: Cardiology

## 2011-11-15 ENCOUNTER — Encounter: Payer: Self-pay | Admitting: Cardiology

## 2011-11-15 VITALS — BP 110/80 | HR 81 | Ht 66.0 in | Wt 150.0 lb

## 2011-11-15 DIAGNOSIS — I6350 Cerebral infarction due to unspecified occlusion or stenosis of unspecified cerebral artery: Secondary | ICD-10-CM

## 2011-11-15 DIAGNOSIS — I2589 Other forms of chronic ischemic heart disease: Secondary | ICD-10-CM

## 2011-11-15 DIAGNOSIS — I255 Ischemic cardiomyopathy: Secondary | ICD-10-CM

## 2011-11-15 DIAGNOSIS — I639 Cerebral infarction, unspecified: Secondary | ICD-10-CM

## 2011-11-15 DIAGNOSIS — E785 Hyperlipidemia, unspecified: Secondary | ICD-10-CM

## 2011-11-15 NOTE — Assessment & Plan Note (Signed)
Lab Results  Component Value Date   CHOL 162 04/20/2011   HDL 51 04/20/2011   LDLCALC 80 04/20/2011   TRIG 157* 04/20/2011   CHOLHDL 3.2 04/20/2011   This is at target and she will continue meds as listed.

## 2011-11-15 NOTE — Progress Notes (Signed)
HPI The patient presents for followup of an ischemic cardiomyopathy.  The patient presents as a new patient for me having previously been treated by Dr.Tennant.  She was found to have reduced ejection fraction in 2009 apparently ischemic in origin though she did not want catheterization. She wanted conservative management. She's done well other than having a "light stroke" in December. I reviewed all available previous records. I do note that she was switched from aspirin to Plavix at that time. She looks remarkably well for her age. She gets along and does her own chores. She denies any chest pressure, neck or arm discomfort. He denies any palpitations, presyncope or syncope. She has no PND or orthopnea. She has no weight gain or edema.  Allergies  Allergen Reactions  . Procaine Hcl Hypertension    PASSED OUT    Current Outpatient Prescriptions  Medication Sig Dispense Refill  . atorvastatin (LIPITOR) 10 MG tablet Take 1 tablet (10 mg total) by mouth daily.  30 tablet  11  . clopidogrel (PLAVIX) 75 MG tablet TAKE 1 TABLET (75 MG TOTAL) BY MOUTH DAILY.  30 tablet  2  . digoxin (LANOXIN) 0.125 MG tablet Take 1 tablet (125 mcg total) by mouth daily.  30 tablet  11  . furosemide (LASIX) 20 MG tablet Take 1 tablet (20 mg total) by mouth daily.  30 tablet  11  . levothyroxine (SYNTHROID, LEVOTHROID) 75 MCG tablet Take 1 tablet (75 mcg total) by mouth daily.  30 tablet  11  . lisinopril (PRINIVIL,ZESTRIL) 2.5 MG tablet Take 1 tablet (2.5 mg total) by mouth daily.  30 tablet  11  . nitroGLYCERIN (NITRODUR - DOSED IN MG/24 HR) 0.2 mg/hr Place 1 patch (0.2 mg total) onto the skin at bedtime.  30 patch  11  . nitroGLYCERIN (NITROSTAT) 0.4 MG SL tablet Place 0.4 mg under the tongue every 5 (five) minutes as needed. CHEST PAIN.       Marland Kitchen potassium chloride (KLOR-CON) 10 MEQ CR tablet Take 1 tablet (10 mEq total) by mouth daily.  30 tablet  6  . DISCONTD: clopidogrel (PLAVIX) 75 MG tablet Take 1 tablet (75  mg total) by mouth daily.  30 tablet  5    Past Medical History  Diagnosis Date  . Ischemic cardiomyopathy June 2009    managed medically; EF is 20 to 25%  . 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 Hypothyroidism   . Hyperlipemia   . Advanced age   . Grief     from husband's death  . Left bundle branch block   . Cardiac arrest 2007    Occurred during planned ovarian surgery that resolved spontaneously after her stomach was deflated.   . Stroke   . Left heart failure     EF is 20 to 25%; She is managed conservatively  . CVA (cerebral infarction) Oct 2010    "light stroke". Plavix started.    Past Surgical History  Procedure Date  . Dilation and curettage of uterus     ROS:  As stated in the HPI and negative for all other systems.  PHYSICAL EXAM BP 110/80  Pulse 81  Ht 5\' 6"  (1.676 m)  Wt 150 lb (68.04 kg)  BMI 24.21 kg/m2 GENERAL:  Well appearing HEENT:  Pupils equal round and reactive, fundi not visualized, oral mucosa unremarkable NECK:  No jugular venous distention, waveform within normal limits, carotid upstroke brisk and symmetric, no bruits, no thyromegaly LYMPHATICS:  No cervical, inguinal adenopathy LUNGS:  Clear to auscultation bilaterally BACK:  No CVA tenderness CHEST:  Unremarkable HEART:  PMI not displaced or sustained,S1 and S2 within normal limits, no S3, no S4, no clicks, no rubs, no murmurs ABD:  Flat, positive bowel sounds normal in frequency in pitch, no bruits, no rebound, no guarding, no midline pulsatile mass, no hepatomegaly, no splenomegaly EXT:  2 plus pulses throughout, no edema, no cyanosis no clubbing SKIN:  No rashes no nodules NEURO:  Cranial nerves II through XII grossly intact, motor grossly intact throughout PSYCH:  Cognitively intact, oriented to person place and time   ASSESSMENT AND PLAN

## 2011-11-15 NOTE — Assessment & Plan Note (Signed)
She will continue on the meds as listed.

## 2011-11-15 NOTE — Patient Instructions (Signed)
The current medical regimen is effective;  continue present plan and medications.  Follow up in 6 months with Dr Hochrein.  You will receive a letter in the mail 2 months before you are due.  Please call us when you receive this letter to schedule your follow up appointment.  

## 2011-11-15 NOTE — Assessment & Plan Note (Addendum)
She wants conservative management. She's asymptomatic with her current regimen. I would not consider an ICD in this situation given her desires. She will continue with the meds as listed.    (Greater than 30 minutes reviewing all data with greater than 50% face to face with the patient).

## 2012-01-02 ENCOUNTER — Telehealth: Payer: Self-pay

## 2012-01-02 NOTE — Telephone Encounter (Signed)
Dr Warren Danes is planning to extract two teeth for [name] on Thursday.  He wants to know if she can hold her coumadin for 2 days prior to the extraction.  Please call and let them know at [phone] or fax to [phone].

## 2012-01-03 NOTE — Telephone Encounter (Signed)
Pt is not on Coumadin - she is on Plavix - left message at Dr Bubba Camp office to call back to clarify if the request is for pt to hold Plavix

## 2012-01-03 NOTE — Telephone Encounter (Signed)
Will forward to MD for orders 

## 2012-01-04 NOTE — Telephone Encounter (Signed)
Dr Bubba Camp office aware.  Info will be faxed

## 2012-01-04 NOTE — Telephone Encounter (Signed)
Dr Warren Danes needs to know if patient can hold Plavix 2 days prior to tooth extraction

## 2012-01-04 NOTE — Telephone Encounter (Signed)
OK to hold Plavix as needed for the procedure (5 days) and restart per Dr. Warren Danes afterward.

## 2012-01-05 ENCOUNTER — Telehealth: Payer: Self-pay | Admitting: *Deleted

## 2012-01-10 ENCOUNTER — Other Ambulatory Visit: Payer: Self-pay | Admitting: Cardiology

## 2012-01-10 NOTE — Telephone Encounter (Signed)
Fax Received. Refill Completed. [name] (R.M.A)   

## 2012-02-16 ENCOUNTER — Encounter (HOSPITAL_COMMUNITY): Payer: Self-pay

## 2012-02-16 ENCOUNTER — Emergency Department (HOSPITAL_COMMUNITY)
Admission: EM | Admit: 2012-02-16 | Discharge: 2012-02-16 | Disposition: A | Discharge status: Home / Self Care | Payer: Medicare Other | Attending: Emergency Medicine | Admitting: Emergency Medicine

## 2012-02-16 DIAGNOSIS — R197 Diarrhea, unspecified: Secondary | ICD-10-CM | POA: Insufficient documentation

## 2012-02-16 DIAGNOSIS — Z87891 Personal history of nicotine dependence: Secondary | ICD-10-CM | POA: Insufficient documentation

## 2012-02-16 DIAGNOSIS — Z8673 Personal history of transient ischemic attack (TIA), and cerebral infarction without residual deficits: Secondary | ICD-10-CM | POA: Insufficient documentation

## 2012-02-16 DIAGNOSIS — I252 Old myocardial infarction: Secondary | ICD-10-CM | POA: Insufficient documentation

## 2012-02-16 DIAGNOSIS — E039 Hypothyroidism, unspecified: Secondary | ICD-10-CM | POA: Insufficient documentation

## 2012-02-16 DIAGNOSIS — E785 Hyperlipidemia, unspecified: Secondary | ICD-10-CM | POA: Insufficient documentation

## 2012-02-16 LAB — COMPREHENSIVE METABOLIC PANEL
CO2: 26 mEq/L (ref 19–32)
Calcium: 9.7 mg/dL (ref 8.4–10.5)
Creatinine, Ser: 1.21 mg/dL — ABNORMAL HIGH (ref 0.50–1.10)
GFR calc Af Amer: 43 mL/min — ABNORMAL LOW (ref >90)
GFR calc non Af Amer: 37 mL/min — ABNORMAL LOW (ref >90)
Glucose, Bld: 92 mg/dL (ref 70–99)
Sodium: 141 mEq/L (ref 135–145)
Total Protein: 7.6 g/dL (ref 6.0–8.3)

## 2012-02-16 LAB — CBC WITH DIFFERENTIAL/PLATELET
Basophils Absolute: 0 10*3/uL (ref 0.0–0.1)
Eosinophils Absolute: 0.1 10*3/uL (ref 0.0–0.7)
Eosinophils Relative: 1 % (ref 0–5)
HCT: 42 % (ref 36.0–46.0)
Lymphocytes Relative: 22 % (ref 12–46)
Lymphs Abs: 2.3 10*3/uL (ref 0.7–4.0)
MCH: 30.4 pg (ref 26.0–34.0)
MCV: 91.3 fL (ref 78.0–100.0)
Monocytes Absolute: 0.8 10*3/uL (ref 0.1–1.0)
Platelets: 148 10*3/uL — ABNORMAL LOW (ref 150–400)
RDW: 13.9 % (ref 11.5–15.5)
WBC: 10.1 10*3/uL (ref 4.0–10.5)

## 2012-02-16 LAB — URINE MICROSCOPIC-ADD ON

## 2012-02-16 LAB — URINALYSIS, ROUTINE W REFLEX MICROSCOPIC
Nitrite: NEGATIVE
Specific Gravity, Urine: 1.022 (ref 1.005–1.030)
Urobilinogen, UA: 0.2 mg/dL (ref 0.0–1.0)
pH: 5 (ref 5.0–8.0)

## 2012-02-16 MED ORDER — SODIUM CHLORIDE 0.9 % IV SOLN
INTRAVENOUS | Status: DC
  Administered 2012-02-16: 500 mL via INTRAVENOUS

## 2012-02-16 NOTE — ED Notes (Signed)
Decaf coffee given upon request.

## 2012-02-16 NOTE — ED Notes (Signed)
Given [place] sandwich and water to patient per EDP.

## 2012-02-16 NOTE — ED Notes (Signed)
Pt's MD office called and reports pt's family called them to complain on long wait.

## 2012-02-16 NOTE — ED Notes (Signed)
Discharge inst reviewed with patient and daughter.  Voiced understanding

## 2012-02-16 NOTE — ED Notes (Signed)
MD at bedside. [name]

## 2012-02-16 NOTE — ED Notes (Signed)
Pt walked to bathroom with NA

## 2012-02-16 NOTE — ED Provider Notes (Signed)
History     CSN: [id number]  Arrival date & time 02/16/12  1109   First MD Initiated Contact with Patient 02/16/12 1604      Chief Complaint  Patient presents with  . Diarrhea    (Consider location/radiation/quality/duration/timing/severity/associated sxs/prior treatment) Patient is a 78 y.o. female presenting with diarrhea.  Diarrhea The primary symptoms include diarrhea.  The diarrhea began today. The diarrhea is watery and blood-tinged. The diarrhea occurs continuously. Risk factors for illness producing diarrhea include new medications and recent antibiotic use (ampicillin end of June, tramadol started a few days ago).  The illness does not include chills. Significant associated medical issues include hemorrhoids.    Past Medical History  Diagnosis Date  . Ischemic cardiomyopathy June 2009    managed medically; EF is 20 to 25%  . 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 Hypothyroidism   . Hyperlipemia   . Advanced age   . Grief     from husband's death  . Left bundle branch block   . Cardiac arrest 2007    Occurred during planned ovarian surgery that resolved spontaneously after her stomach was deflated.   . Stroke   . Left heart failure     EF is 20 to 25%; She is managed conservatively  . CVA (cerebral infarction) Oct 2010    "light stroke". Plavix started.    Past Surgical History  Procedure Date  . Dilation and curettage of uterus     Family History  Problem Relation Age of Onset  . Aneurysm Father   . Heart attack Mother     History  Substance Use Topics  . Smoking status: Former Smoker    Quit date: 07/11/1968  . Smokeless tobacco: Never Used  . Alcohol Use: No    OB History    Grav Para Term Preterm Abortions TAB SAB Ect Mult Living                  Review of Systems  Constitutional: Negative for chills.  Gastrointestinal: Positive for diarrhea.  All other systems reviewed and are negative.    Allergies  Procaine  hcl  Home Medications   Current Outpatient Rx  Name Route Sig Dispense Refill  . ATORVASTATIN CALCIUM 10 MG PO TABS Oral Take 10 mg by mouth daily.    Marland Kitchen CLOPIDOGREL BISULFATE 75 MG PO TABS Oral Take 75 mg by mouth daily.    Marland Kitchen DIGOXIN 0.125 MG PO TABS Oral Take 0.125 mg by mouth daily.    . FUROSEMIDE 20 MG PO TABS Oral Take 20 mg by mouth daily.    Marland Kitchen LEVOTHYROXINE SODIUM 75 MCG PO TABS Oral Take 75 mcg by mouth daily.    Marland Kitchen LISINOPRIL 2.5 MG PO TABS Oral Take 2.5 mg by mouth daily.    Marland Kitchen NITROGLYCERIN 0.2 MG/HR TD PT24 Transdermal Place 1 patch onto the skin at bedtime.    Marland Kitchen NITROGLYCERIN 0.4 MG SL SUBL Sublingual Place 0.4 mg under the tongue every 5 (five) minutes as needed. For chest pain    . POTASSIUM CHLORIDE ER 10 MEQ PO TBCR Oral Take 10 mEq by mouth daily.      BP 138/73  Pulse 92  Temp 98.1 F (36.7 C) (Oral)  Resp 17  Ht 5\' 6"  (1.676 m)  Wt 146 lb (66.225 kg)  BMI 23.56 kg/m2  SpO2 98%  Physical Exam  Constitutional: She is oriented to person, place, and time. Vital signs are normal. She appears well-developed and well-nourished.  Non-toxic appearance. No distress.  HENT:  Head: Normocephalic and atraumatic.  Mouth/Throat: Uvula is midline, oropharynx is clear and moist and mucous membranes are normal.  Eyes: Conjunctivae are normal. Pupils are equal, round, and reactive to light.  Neck: Neck supple.  Cardiovascular: Normal rate, regular rhythm, normal heart sounds and normal pulses.   Pulmonary/Chest: Effort normal and breath sounds normal.  Abdominal: Soft. Normal appearance and bowel sounds are normal. She exhibits no mass. There is no hepatosplenomegaly. There is no tenderness. No hernia.  Lymphadenopathy:    She has no cervical adenopathy.       Right: No inguinal and no supraclavicular adenopathy present.       Left: No inguinal and no supraclavicular adenopathy present.  Neurological: She is alert and oriented to person, place, and time.  Skin: Skin is warm,  dry and intact. Purpura: mild skin tenting.    ED Course  Procedures (including critical care time)   4:45 PM - Orthostatic vitals signs:  Laying - Pulse Rate: 80  BP: 118/65; Sitting - Pulse Rate: 88  BP: 137/70; Standing - Pulse Rate: 95 ; BP: 122/69.  5:58 PM - Pt reassessed.  Resting comfortably.  No abdominal pain or N/V/D.  Patient now remembers taking ampicillin when her tooth was extracted in June.  Asked RN to provide with food and drink.  7:13 PM - Pt reassessed.  Resting comfortably.  Hemodynamically stable. Pt eat a sandwich with out symptoms.  Drinking fluids well.  Has not had a BM since arriving here.  We will move to CDU.  9:10 PM - Pt reassessed.  Resting comfortably.  Still has not had a BM since being here.   Labs Reviewed  CBC WITH DIFFERENTIAL - Abnormal; Notable for the following:    Platelets 148 (*)     All other components within normal limits  COMPREHENSIVE METABOLIC PANEL - Abnormal; Notable for the following:    BUN 26 (*)     Creatinine, Ser 1.21 (*)     Total Bilirubin 2.8 (*)     GFR calc non Af Amer 37 (*)     GFR calc Af Amer 43 (*)     All other components within normal limits  URINALYSIS, ROUTINE W REFLEX MICROSCOPIC - Abnormal; Notable for the following:    Hgb urine dipstick TRACE (*)     Ketones, ur 15 (*)     Leukocytes, UA TRACE (*)     All other components within normal limits  URINE MICROSCOPIC-ADD ON - Abnormal; Notable for the following:    Squamous Epithelial / LPF FEW (*)     Bacteria, UA FEW (*)     Casts HYALINE CASTS (*)     All other components within normal limits  CLOSTRIDIUM DIFFICILE BY PCR    1. Diarrhea     MDM  1.   Diarrhea:  Likely secondary to new medicine (Tramadol).  Infectious etiology possible, but pt has no fever, leukocytosis, abdominal pain, nausea or vomiting.  Pt took ampicillin over a month ago, so there is a risk of C. diff, but again there is no supporting evidence of an infectious process.  She is  eating and drinking without problems here.  She says she has an appointment with her physician next Wednesday.  We will discharge her home.  Lollie Sails, MD 02/16/12 2111

## 2012-02-16 NOTE — ED Notes (Signed)
Pt presents with 2-3 day h/o diarrhea.  Pt reports lower abdominal pain x 2 days.  Pt reports a "knot" to R lower abdomen "for years" that gets larger when she sits up and goes away while lying supine.  -nausea or vomiting, reports no appetite x 2 weeks.

## 2012-02-16 NOTE — ED Provider Notes (Signed)
I saw and evaluated the patient, reviewed the resident's note and I agree with the findings and plan.   .Face to face Exam:  General:  Awake HEENT:  Atraumatic Resp:  Normal effort Abd:  Nondistended Neuro:No focal weakness Lymph: No adenopathy   Nelia Shi, MD 02/16/12 2112

## 2012-02-22 ENCOUNTER — Telehealth: Payer: Self-pay | Admitting: Cardiology

## 2012-02-22 MED ORDER — DIGOXIN 125 MCG PO TABS: 0.1250 mg | ORAL_TABLET | Freq: Every day | ORAL | Status: DC

## 2012-02-22 MED ORDER — FUROSEMIDE 20 MG PO TABS: 20.0000 mg | ORAL_TABLET | Freq: Every day | ORAL | Status: DC

## 2012-02-22 NOTE — Telephone Encounter (Signed)
Pt was made an ov and meds refilled,

## 2012-02-22 NOTE — Telephone Encounter (Signed)
Please return call to patient [phone], concerning labs

## 2012-03-15 ENCOUNTER — Other Ambulatory Visit: Payer: Self-pay | Admitting: Nurse Practitioner

## 2012-03-27 ENCOUNTER — Ambulatory Visit (INDEPENDENT_AMBULATORY_CARE_PROVIDER_SITE_OTHER): Payer: Medicare Other | Admitting: Cardiology

## 2012-03-27 ENCOUNTER — Encounter: Payer: Self-pay | Admitting: Cardiology

## 2012-03-27 VITALS — BP 146/76 | HR 80 | Ht 66.0 in | Wt 145.8 lb

## 2012-03-27 DIAGNOSIS — I255 Ischemic cardiomyopathy: Secondary | ICD-10-CM

## 2012-03-27 DIAGNOSIS — E785 Hyperlipidemia, unspecified: Secondary | ICD-10-CM

## 2012-03-27 DIAGNOSIS — I2589 Other forms of chronic ischemic heart disease: Secondary | ICD-10-CM

## 2012-03-27 NOTE — Patient Instructions (Addendum)
The current medical regimen is effective;  continue present plan and medications.  Follow up in 6 months with Norma Fredrickson.

## 2012-03-27 NOTE — Progress Notes (Signed)
HPI The patient presents for followup of an ischemic cardiomyopathy.  The patient presents as a new patient for me having previously been treated by Dr.Tennant.  She was found to have reduced ejection fraction in 2009 apparently ischemic in origin though she did not want catheterization. She wanted conservative management. She's done well other than having a "light stroke" in December. I reviewed all available previous records. I do note that she was switched from aspirin to Plavix at that time. She looks remarkably well for her age. She gets along and does her own chores. She denies any chest pressure, neck or arm discomfort. He denies any palpitations, presyncope or syncope. She has no PND or orthopnea. She has no weight gain or edema.  Allergies  Allergen Reactions  . Procaine Hcl Hypertension    PASSED OUT    Current Outpatient Prescriptions  Medication Sig Dispense Refill  . atorvastatin (LIPITOR) 10 MG tablet Take 10 mg by mouth daily.      . clopidogrel (PLAVIX) 75 MG tablet Take 75 mg by mouth daily.      . digoxin (LANOXIN) 0.125 MG tablet Take 1 tablet (0.125 mg total) by mouth daily.  30 tablet  5  . furosemide (LASIX) 20 MG tablet Take 1 tablet (20 mg total) by mouth daily.  30 tablet  5  . levothyroxine (SYNTHROID, LEVOTHROID) 75 MCG tablet Take 75 mcg by mouth daily.      Marland Kitchen lisinopril (PRINIVIL,ZESTRIL) 2.5 MG tablet Take 2.5 mg by mouth daily.      . nitroGLYCERIN (NITRODUR - DOSED IN MG/24 HR) 0.2 mg/hr Place 1 patch onto the skin at bedtime.      . nitroGLYCERIN (NITROSTAT) 0.4 MG SL tablet Place 0.4 mg under the tongue every 5 (five) minutes as needed. For chest pain      . potassium chloride (K-DUR) 10 MEQ tablet Take 10 mEq by mouth daily.      . potassium chloride (K-DUR,KLOR-CON) 10 MEQ tablet TAKE 1 TABLET (10 MEQ TOTAL) BY MOUTH DAILY.  30 tablet  6    Past Medical History  Diagnosis Date  . Ischemic cardiomyopathy June 2009    managed medically; EF is 20 to 25%    . 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 Hypothyroidism   . Hyperlipemia   . Advanced age   . Grief     from husband's death  . Left bundle branch block   . Cardiac arrest 2007    Occurred during planned ovarian surgery that resolved spontaneously after her stomach was deflated.   . Stroke   . Left heart failure     EF is 20 to 25%; She is managed conservatively  . CVA (cerebral infarction) Oct 2010    "light stroke". Plavix started.    Past Surgical History  Procedure Date  . Dilation and curettage of uterus     ROS:  As stated in the HPI and negative for all other systems.  PHYSICAL EXAM BP 146/76  Pulse 80  Ht 5\' 6"  (1.676 m)  Wt 145 lb 12.8 oz (66.134 kg)  BMI 23.53 kg/m2 GENERAL:  Well appearing HEENT:  Pupils equal round and reactive, fundi not visualized, oral mucosa unremarkable NECK:  No jugular venous distention, waveform within normal limits, carotid upstroke brisk and symmetric, no bruits, no thyromegaly LYMPHATICS:  No cervical, inguinal adenopathy LUNGS:  Clear to auscultation bilaterally BACK:  No CVA tenderness CHEST:  Unremarkable HEART:  PMI not displaced or sustained,S1 and S2  within normal limits, no S3, no S4, no clicks, no rubs, no murmurs ABD:  Flat, positive bowel sounds normal in frequency in pitch, no bruits, no rebound, no guarding, no midline pulsatile mass, no hepatomegaly, no splenomegaly EXT:  2 plus pulses throughout, no edema, no cyanosis no clubbing SKIN:  No rashes no nodules NEURO:  Cranial nerves II through XII grossly intact, motor grossly intact throughout PSYCH:  Cognitively intact, oriented to person place and time  EKG:  NSR rate 80, LAD, LBBB 03/27/2012  ASSESSMENT AND PLAN  Ischemic cardiomyopathy -  She wants conservative management.   I would not consider an ICD in this situation given the fact that she does not want aggressive therapy. She will continue with the meds as listed.   HTN - Her blood pressure is  mildly elevated today.  However, I reviewed previous labs and it is quite labile.  I will not change her meds as she feels well.

## 2012-04-10 ENCOUNTER — Telehealth: Payer: Self-pay | Admitting: Cardiology

## 2012-04-10 NOTE — Telephone Encounter (Signed)
Mailed patient her office visit from 9.17.2013. Patient states she needed this to know when to schedule her next appointment. Patient stated that [facility] normally mailed this to her. djc

## 2012-04-19 ENCOUNTER — Other Ambulatory Visit: Payer: Self-pay | Admitting: Nurse Practitioner

## 2012-04-20 NOTE — Telephone Encounter (Signed)
Fax Received. Refill Completed. Bralen Wiltgen Chowoe (R.M.A)   

## 2012-05-02 ENCOUNTER — Other Ambulatory Visit: Payer: Self-pay | Admitting: Nurse Practitioner

## 2012-05-03 ENCOUNTER — Telehealth: Payer: Self-pay | Admitting: Cardiology

## 2012-05-03 NOTE — Telephone Encounter (Signed)
Pt would like to speak with Dawayne Patricia. Regarding plavix rx and spots on her arm, she can be reached at [phone]

## 2012-05-03 NOTE — Telephone Encounter (Signed)
Pt. Is advised that bruising is normal while taking Plavix, she verbalized understanding.

## 2012-05-03 NOTE — Telephone Encounter (Signed)
Can someone call and speak to her about this? I am away until next week.

## 2012-05-04 ENCOUNTER — Other Ambulatory Visit: Payer: Self-pay | Admitting: *Deleted

## 2012-05-04 MED ORDER — LISINOPRIL 2.5 MG PO TABS: 2.5000 mg | ORAL_TABLET | Freq: Every day | ORAL | Status: DC

## 2012-07-01 ENCOUNTER — Other Ambulatory Visit: Payer: Self-pay | Admitting: Cardiology

## 2012-08-15 ENCOUNTER — Telehealth: Payer: Self-pay | Admitting: Cardiology

## 2012-08-15 ENCOUNTER — Other Ambulatory Visit: Payer: Self-pay | Admitting: Cardiology

## 2012-08-15 NOTE — Telephone Encounter (Signed)
..   Requested Prescriptions   Pending Prescriptions Disp Refills  . digoxin (LANOXIN) 0.125 MG tablet [Pharmacy Med Name: DIGOXIN 0.125MG  TAB] 30 tablet 4    Sig: TAKE 1 TABLET (0.125 MG TOTAL) BY MOUTH DAILY.  . furosemide (LASIX) 20 MG tablet [Pharmacy Med Name: FUROSEMIDE 20MG  TAB] 30 tablet 4    Sig: TAKE 1 TABLET (20 MG TOTAL) BY MOUTH DAILY.

## 2012-08-27 ENCOUNTER — Encounter: Payer: Self-pay | Admitting: Nurse Practitioner

## 2012-08-27 ENCOUNTER — Ambulatory Visit (INDEPENDENT_AMBULATORY_CARE_PROVIDER_SITE_OTHER): Payer: Medicare Other | Admitting: Nurse Practitioner

## 2012-08-27 VITALS — BP 122/78 | HR 84 | Ht 66.0 in | Wt 149.8 lb

## 2012-08-27 DIAGNOSIS — E039 Hypothyroidism, unspecified: Secondary | ICD-10-CM

## 2012-08-27 DIAGNOSIS — I255 Ischemic cardiomyopathy: Secondary | ICD-10-CM

## 2012-08-27 DIAGNOSIS — I2589 Other forms of chronic ischemic heart disease: Secondary | ICD-10-CM

## 2012-08-27 DIAGNOSIS — E785 Hyperlipidemia, unspecified: Secondary | ICD-10-CM

## 2012-08-27 LAB — HEPATIC FUNCTION PANEL
ALT: 13 U/L (ref 0–35)
AST: 20 U/L (ref 0–37)
Albumin: 4.2 g/dL (ref 3.5–5.2)
Alkaline Phosphatase: 58 U/L (ref 39–117)
Bilirubin, Direct: 0.2 mg/dL (ref 0.0–0.3)
Total Bilirubin: 2 mg/dL — ABNORMAL HIGH (ref 0.3–1.2)
Total Protein: 6.6 g/dL (ref 6.0–8.3)

## 2012-08-27 LAB — CBC WITH DIFFERENTIAL/PLATELET
Basophils Absolute: 0 10*3/uL (ref 0.0–0.1)
Basophils Relative: 0.3 % (ref 0.0–3.0)
Eosinophils Absolute: 0.1 10*3/uL (ref 0.0–0.7)
Eosinophils Relative: 1.2 % (ref 0.0–5.0)
HCT: 37.9 % (ref 36.0–46.0)
Hemoglobin: 12.8 g/dL (ref 12.0–15.0)
Lymphocytes Relative: 29.3 % (ref 12.0–46.0)
Lymphs Abs: 1.8 10*3/uL (ref 0.7–4.0)
MCHC: 33.8 g/dL (ref 30.0–36.0)
MCV: 90.1 fl (ref 78.0–100.0)
Monocytes Absolute: 0.5 10*3/uL (ref 0.1–1.0)
Monocytes Relative: 8.6 % (ref 3.0–12.0)
Neutro Abs: 3.7 10*3/uL (ref 1.4–7.7)
Neutrophils Relative %: 60.6 % (ref 43.0–77.0)
Platelets: 142 10*3/uL — ABNORMAL LOW (ref 150.0–400.0)
RBC: 4.2 Mil/uL (ref 3.87–5.11)
RDW: 13.9 % (ref 11.5–14.6)
WBC: 6.2 10*3/uL (ref 4.5–10.5)

## 2012-08-27 LAB — TSH: TSH: 0.7 u[IU]/mL (ref 0.35–5.50)

## 2012-08-27 LAB — BASIC METABOLIC PANEL
BUN: 27 mg/dL — ABNORMAL HIGH (ref 6–23)
CO2: 28 mEq/L (ref 19–32)
Calcium: 9.1 mg/dL (ref 8.4–10.5)
Chloride: 104 mEq/L (ref 96–112)
Creatinine, Ser: 1 mg/dL (ref 0.4–1.2)
GFR: 52.44 mL/min — ABNORMAL LOW (ref >60.00)
Glucose, Bld: 94 mg/dL (ref 70–99)
Potassium: 4.1 mEq/L (ref 3.5–5.1)
Sodium: 140 mEq/L (ref 135–145)

## 2012-08-27 LAB — LIPID PANEL
Cholesterol: 157 mg/dL (ref 0–200)
HDL: 56 mg/dL (ref >39.00)
LDL Cholesterol: 69 mg/dL (ref 0–99)
Total CHOL/HDL Ratio: 3
Triglycerides: 158 mg/dL — ABNORMAL HIGH (ref 0.0–149.0)
VLDL: 31.6 mg/dL (ref 0.0–40.0)

## 2012-08-27 NOTE — Progress Notes (Signed)
[name] Date of Birth: [birth year] Medical Record #[id number]  History of Present Illness: [name] is seen back today for a 5 month check. She is seen for Dr. Antoine Poche. She is a former patient of Dr. Ronnald Nian. She is 79 years of age. She has an ischemic CM and is managed conservatively. EF is 25 to 30% per echo back in March of 2012. Other issues include prior stroke in December of 2012. She is on chronic Plavix. She has a chronic LBBB, hypothyroidism and HLD.   She comes in today. She is here alone. She continues to do well. She is having some knee pain because she twisted her knee getting out of her car. She had a fall back in December from walking too fast but did not get hurt. She remains active. Uses her cane. She is not having chest pain. She is not short of breath. Continues to do all of her own house work and still drives. Not passing out. No dizziness. She is wanting to have her right cataract and says she needs an ok. She can't remember the doctor's name and will be back in touch with [place]. Tolerating her medicines. No recent labs.   Current Outpatient Prescriptions on File Prior to Visit  Medication Sig Dispense Refill  . atorvastatin (LIPITOR) 10 MG tablet Take 10 mg by mouth daily.      . clopidogrel (PLAVIX) 75 MG tablet TAKE 1 TABLET (75 MG TOTAL) BY MOUTH DAILY.  30 tablet  4  . digoxin (LANOXIN) 0.125 MG tablet TAKE 1 TABLET (0.125 MG TOTAL) BY MOUTH DAILY.  30 tablet  4  . furosemide (LASIX) 20 MG tablet TAKE 1 TABLET (20 MG TOTAL) BY MOUTH DAILY.  30 tablet  4  . levothyroxine (SYNTHROID, LEVOTHROID) 75 MCG tablet Take 75 mcg by mouth daily.      Marland Kitchen lisinopril (PRINIVIL,ZESTRIL) 2.5 MG tablet Take 1 tablet (2.5 mg total) by mouth daily.  30 tablet  9  . nitroGLYCERIN (NITRODUR - DOSED IN MG/24 HR) 0.2 mg/hr PLACE 1 PATCH (0.2 MG TOTAL) ONTO THE SKIN AT BEDTIME.  30 patch  6  . nitroGLYCERIN (NITROSTAT) 0.4 MG SL tablet Place 0.4 mg under the tongue every 5 (five) minutes  as needed. For chest pain      . potassium chloride (K-DUR,KLOR-CON) 10 MEQ tablet TAKE 1 TABLET (10 MEQ TOTAL) BY MOUTH DAILY.  30 tablet  6   No current facility-administered medications on file prior to visit.    Allergies  Allergen Reactions  . Procaine Hcl Hypertension    PASSED OUT    Past Medical History  Diagnosis Date  . Ischemic cardiomyopathy June 2009    managed medically; EF is 20 to 25%  . 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 Hypothyroidism   . Hyperlipemia   . Advanced age   . Grief     from husband's death  . Left bundle branch block   . Cardiac arrest 2007    Occurred during planned ovarian surgery that resolved spontaneously after her stomach was deflated.   . Stroke   . Left heart failure     EF is 20 to 25%; She is managed conservatively  . CVA (cerebral infarction) Oct 2010    "light stroke". Plavix started.    Past Surgical History  Procedure Laterality Date  . Dilation and curettage of uterus      History  Smoking status  . Former Smoker  . Quit date: 07/11/1968  Smokeless tobacco  . Never Used    History  Alcohol Use No    Family History  Problem Relation Age of Onset  . Aneurysm Father   . Heart attack Mother     Review of Systems: The review of systems is per the HPI.  All other systems were reviewed and are negative.  Physical Exam: Ht 5\' 6"  (1.676 m)  Wt 149 lb 12.8 oz (67.949 kg)  BMI 24.19 kg/m2 Patient is very pleasant and in no acute distress. Little hard of hearing. Skin is warm and dry. Color is normal.  HEENT is unremarkable. Normocephalic/atraumatic. PERRL. Sclera are nonicteric. Neck is supple. No masses. No JVD. Lungs are clear. Cardiac exam shows a regular rate and rhythm. Soft S3 noted. Abdomen is soft. Extremities are without edema. Gait and ROM are intact. No gross neurologic deficits noted.  LABORATORY DATA: Pending.   Lab Results  Component Value Date   WBC 10.1 02/16/2012   HGB 14.0 02/16/2012     HCT 42.0 02/16/2012   PLT 148* 02/16/2012   GLUCOSE 92 02/16/2012   CHOL 162 04/20/2011   TRIG 157* 04/20/2011   HDL 51 04/20/2011   LDLCALC 80 04/20/2011   ALT 12 02/16/2012   AST 21 02/16/2012   NA 141 02/16/2012   K 3.6 02/16/2012   CL 100 02/16/2012   CREATININE 1.21* 02/16/2012   BUN 26* 02/16/2012   CO2 26 02/16/2012   TSH 2.292 04/20/2011   INR 1.04 04/19/2011   HGBA1C  Value: 5.7 (NOTE)   The ADA recommends the following therapeutic goals for glycemic   control related to Hgb A1C measurement:   Goal of Therapy:   < 7.0% Hgb A1C   Action Suggested:  > 8.0% Hgb A1C   Ref:  Diabetes Care, 22, Suppl. 1, 1999 12/20/2007   Assessment / Plan:  1. Ischemic CM - EF is 25 to 30% - managed conservatively - she continues to do very well and is asymptomatic.   2. HLD - on low dose statin therapy - recheck labs today  3. Advanced age   67. Chronic LBBB  5. Need for cataract surgery - should be an acceptable candidate. She has no cardiac symptoms.   6. HTN - blood pressure looks ok. No change in her current regimen.   Patient is agreeable to this plan and will call if any problems develop in the interim.

## 2012-08-27 NOTE — Patient Instructions (Addendum)
I think you are doing ok.  I think you are an acceptable candidate for your cataract surgery - call us with the Doctor's name and we will send him a note  We are checking labs today  Stay on your current medicines  See Dr. Antoine Poche in 6 months  Call the [facility] office at [phone] if you have any questions, problems or concerns.

## 2012-08-28 ENCOUNTER — Telehealth: Payer: Self-pay | Admitting: Nurse Practitioner

## 2012-08-28 NOTE — Telephone Encounter (Signed)
Pt notified of lab results

## 2012-08-28 NOTE — Telephone Encounter (Signed)
New Problem:    Patient called returned a call about her recent lab results.  Please call back.

## 2012-09-06 ENCOUNTER — Ambulatory Visit
Admission: RE | Admit: 2012-09-06 | Discharge: 2012-09-06 | Disposition: A | Discharge status: Home / Self Care | Payer: Medicare Other | Source: Ambulatory Visit | Attending: Internal Medicine | Admitting: Internal Medicine

## 2012-09-06 ENCOUNTER — Other Ambulatory Visit: Payer: Self-pay | Admitting: Internal Medicine

## 2012-09-06 DIAGNOSIS — R0602 Shortness of breath: Secondary | ICD-10-CM

## 2012-09-06 DIAGNOSIS — R0781 Pleurodynia: Secondary | ICD-10-CM

## 2012-09-18 ENCOUNTER — Other Ambulatory Visit: Payer: Self-pay | Admitting: Nurse Practitioner

## 2012-09-18 HISTORY — PX: CATARACT EXTRACTION: SUR2

## 2012-10-20 ENCOUNTER — Emergency Department (HOSPITAL_COMMUNITY): Payer: Medicare Other

## 2012-10-20 ENCOUNTER — Emergency Department (HOSPITAL_COMMUNITY)
Admission: EM | Admit: 2012-10-20 | Discharge: 2012-10-20 | Disposition: A | Discharge status: Home / Self Care | Payer: Medicare Other | Attending: Emergency Medicine | Admitting: Emergency Medicine

## 2012-10-20 ENCOUNTER — Other Ambulatory Visit: Payer: Self-pay

## 2012-10-20 ENCOUNTER — Encounter (HOSPITAL_COMMUNITY): Payer: Self-pay | Admitting: Emergency Medicine

## 2012-10-20 DIAGNOSIS — Y939 Activity, unspecified: Secondary | ICD-10-CM | POA: Insufficient documentation

## 2012-10-20 DIAGNOSIS — Z7902 Long term (current) use of antithrombotics/antiplatelets: Secondary | ICD-10-CM | POA: Insufficient documentation

## 2012-10-20 DIAGNOSIS — Z87891 Personal history of nicotine dependence: Secondary | ICD-10-CM | POA: Insufficient documentation

## 2012-10-20 DIAGNOSIS — Z79899 Other long term (current) drug therapy: Secondary | ICD-10-CM | POA: Insufficient documentation

## 2012-10-20 DIAGNOSIS — E785 Hyperlipidemia, unspecified: Secondary | ICD-10-CM | POA: Insufficient documentation

## 2012-10-20 DIAGNOSIS — Z8679 Personal history of other diseases of the circulatory system: Secondary | ICD-10-CM | POA: Insufficient documentation

## 2012-10-20 DIAGNOSIS — R296 Repeated falls: Secondary | ICD-10-CM | POA: Insufficient documentation

## 2012-10-20 DIAGNOSIS — Z8673 Personal history of transient ischemic attack (TIA), and cerebral infarction without residual deficits: Secondary | ICD-10-CM | POA: Insufficient documentation

## 2012-10-20 DIAGNOSIS — E039 Hypothyroidism, unspecified: Secondary | ICD-10-CM | POA: Insufficient documentation

## 2012-10-20 DIAGNOSIS — S2231XS Fracture of one rib, right side, sequela: Secondary | ICD-10-CM

## 2012-10-20 DIAGNOSIS — S2249XA Multiple fractures of ribs, unspecified side, initial encounter for closed fracture: Secondary | ICD-10-CM | POA: Insufficient documentation

## 2012-10-20 DIAGNOSIS — Y929 Unspecified place or not applicable: Secondary | ICD-10-CM | POA: Insufficient documentation

## 2012-10-20 DIAGNOSIS — K8020 Calculus of gallbladder without cholecystitis without obstruction: Secondary | ICD-10-CM

## 2012-10-20 DIAGNOSIS — Z8674 Personal history of sudden cardiac arrest: Secondary | ICD-10-CM | POA: Insufficient documentation

## 2012-10-20 LAB — COMPREHENSIVE METABOLIC PANEL
AST: 18 U/L (ref 0–37)
Albumin: 3.8 g/dL (ref 3.5–5.2)
CO2: 28 mEq/L (ref 19–32)
Calcium: 9.4 mg/dL (ref 8.4–10.5)
Creatinine, Ser: 1.12 mg/dL — ABNORMAL HIGH (ref 0.50–1.10)
GFR calc non Af Amer: 41 mL/min — ABNORMAL LOW (ref >90)

## 2012-10-20 LAB — POCT I-STAT, CHEM 8
Calcium, Ion: 1.15 mmol/L (ref 1.13–1.30)
Glucose, Bld: 107 mg/dL — ABNORMAL HIGH (ref 70–99)
HCT: 35 % — ABNORMAL LOW (ref 36.0–46.0)
Hemoglobin: 11.9 g/dL — ABNORMAL LOW (ref 12.0–15.0)
TCO2: 26 mmol/L (ref 0–100)

## 2012-10-20 LAB — CBC
HCT: 35.9 % — ABNORMAL LOW (ref 36.0–46.0)
MCH: 29.9 pg (ref 26.0–34.0)
MCHC: 33.7 g/dL (ref 30.0–36.0)
MCV: 88.6 fL (ref 78.0–100.0)
RDW: 13.7 % (ref 11.5–15.5)

## 2012-10-20 MED ORDER — HYDROCODONE-ACETAMINOPHEN 5-325 MG PO TABS
1.0000 | ORAL_TABLET | Freq: Once | ORAL | Status: AC
  Administered 2012-10-20: 1 via ORAL
  Filled 2012-10-20: qty 1

## 2012-10-20 MED ORDER — HYDROCODONE-ACETAMINOPHEN 5-325 MG PO TABS: 1.0000 | ORAL_TABLET | ORAL | Status: DC | PRN

## 2012-10-20 NOTE — ED Notes (Signed)
Pt from home via ems, c/o pain in right shoulder starting at 5 am, pain has moved to right breast, Pt states worst pain ever. Prior N/ no vomiting, states hard to breath. Pt post fall 3-4 weeks landing on right side.

## 2012-10-20 NOTE — ED Notes (Signed)
Patient transported to Ultrasound 

## 2012-10-20 NOTE — ED Provider Notes (Signed)
History     CSN: [id number]  Arrival date & time 10/20/12  7829   First MD Initiated Contact with Patient 10/20/12 0700      Chief Complaint  Patient presents with  . Chest Pain    (Consider location/radiation/quality/duration/timing/severity/associated sxs/prior treatment) HPI Comments: Patient presents to the ER for evaluation of right-sided chest pain. Patient reports that she woke up at 5 AM with pain in her right shoulder. She says the pain then quickly moved to the right side of her chest and has been present ever since. Initially it was quite severe, he has now eased off. Patient denies difficulty breathing. No nausea, vomiting, diaphoresis. She has not had any abdominal pain. Patient does report that she had a fall 4 weeks ago and had pain after the fall. She says this pain, however, has suddenly worsened.  Patient is a 79 y.o. female presenting with chest pain.  Chest Pain Associated symptoms: no abdominal pain and no shortness of breath     Past Medical History  Diagnosis Date  . Ischemic cardiomyopathy June 2009    managed medically; EF is 20 to 25%  . 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 Hypothyroidism   . Hyperlipemia   . Advanced age   . Grief     from husband's death  . Left bundle branch block   . Cardiac arrest 2007    Occurred during planned ovarian surgery that resolved spontaneously after her stomach was deflated.   . Stroke   . Left heart failure     EF is 20 to 25%; She is managed conservatively  . CVA (cerebral infarction) Oct 2010    "light stroke". Plavix started.    Past Surgical History  Procedure Laterality Date  . Dilation and curettage of uterus    . Cataract extraction  09/18/2012    right eye    Family History  Problem Relation Age of Onset  . Aneurysm Father   . Heart attack Mother     History  Substance Use Topics  . Smoking status: Former Smoker    Quit date: 07/11/1968  . Smokeless tobacco: Never Used  .  Alcohol Use: No    OB History   Grav Para Term Preterm Abortions TAB SAB Ect Mult Living                  Review of Systems  Respiratory: Negative for shortness of breath.   Cardiovascular: Positive for chest pain.  Gastrointestinal: Negative for abdominal pain.  All other systems reviewed and are negative.    Allergies  Procaine hcl  Home Medications   Current Outpatient Rx  Name  Route  Sig  Dispense  Refill  . atorvastatin (LIPITOR) 10 MG tablet   Oral   Take 10 mg by mouth daily.         . clopidogrel (PLAVIX) 75 MG tablet      TAKE 1 TABLET (75 MG TOTAL) BY MOUTH DAILY.   30 tablet   4     CYCLE FILL MEDICATION. Authorization is required f ...   . digoxin (LANOXIN) 0.125 MG tablet      TAKE 1 TABLET (0.125 MG TOTAL) BY MOUTH DAILY.   30 tablet   4     CYCLE FILL MEDICATION. Authorization is required f ...   . furosemide (LASIX) 20 MG tablet      TAKE 1 TABLET (20 MG TOTAL) BY MOUTH DAILY.   30 tablet   4  CYCLE FILL MEDICATION. Authorization is required f ...   . levothyroxine (SYNTHROID, LEVOTHROID) 75 MCG tablet   Oral   Take 75 mcg by mouth daily.         Marland Kitchen lisinopril (PRINIVIL,ZESTRIL) 2.5 MG tablet   Oral   Take 1 tablet (2.5 mg total) by mouth daily.   30 tablet   9   . nitroGLYCERIN (NITRODUR - DOSED IN MG/24 HR) 0.2 mg/hr      PLACE 1 PATCH (0.2 MG TOTAL) ONTO THE SKIN AT BEDTIME.   30 patch   6     USES MYLAN BRAND PATCHES ONLY   . nitroGLYCERIN (NITROSTAT) 0.4 MG SL tablet   Sublingual   Place 0.4 mg under the tongue every 5 (five) minutes as needed. For chest pain         . potassium chloride (K-DUR,KLOR-CON) 10 MEQ tablet      TAKE 1 TABLET (10 MEQ TOTAL) BY MOUTH DAILY.   30 tablet   5     CYCLE FILL MEDICATION. Authorization is required f ...     BP 134/57  Pulse 82  Resp 22  SpO2 96%  Physical Exam  Constitutional: She is oriented to person, place, and time. She appears well-developed and  well-nourished. No distress.  HENT:  Head: Normocephalic and atraumatic.  Right Ear: Hearing normal.  Nose: Nose normal.  Mouth/Throat: Oropharynx is clear and moist and mucous membranes are normal.  Eyes: Conjunctivae and EOM are normal. Pupils are equal, round, and reactive to light.  Neck: Normal range of motion. Neck supple.  Cardiovascular: Normal rate, regular rhythm, S1 normal and S2 normal.  Exam reveals no gallop and no friction rub.   No murmur heard. Pulmonary/Chest: Effort normal and breath sounds normal. No respiratory distress. She exhibits no tenderness.    Abdominal: Soft. Normal appearance and bowel sounds are normal. There is no hepatosplenomegaly. There is no tenderness. There is no rebound, no guarding, no tenderness at McBurney's point and negative Murphy's sign. No hernia.  Musculoskeletal: Normal range of motion.  Neurological: She is alert and oriented to person, place, and time. She has normal strength. No cranial nerve deficit or sensory deficit. Coordination normal. GCS eye subscore is 4. GCS verbal subscore is 5. GCS motor subscore is 6.  Skin: Skin is warm, dry and intact. No rash noted. No cyanosis.  Psychiatric: She has a normal mood and affect. Her speech is normal and behavior is normal. Thought content normal.    ED Course  Procedures (including critical care time)  EKG:  Date: 10/20/2012  Rate: 83  Rhythm: normal sinus rhythm  QRS Axis: left  Intervals: PR prolonged  ST/T Wave abnormalities: LBBB  Conduction Disutrbances:left bundle branch block  Narrative Interpretation:   Old EKG Reviewed: unchanged    Labs Reviewed  POCT I-STAT, CHEM 8 - Abnormal; Notable for the following:    Creatinine, Ser 1.20 (*)    Glucose, Bld 107 (*)    Hemoglobin 11.9 (*)    HCT 35.0 (*)    All other components within normal limits  CBC  COMPREHENSIVE METABOLIC PANEL   Dg Ribs Unilateral W/chest Right  10/20/2012  *RADIOLOGY REPORT*  Clinical Data:  Persistent right chest pain status post fall 5 weeks ago.  RIGHT RIBS AND CHEST - 3+ VIEW  Comparison: Chest and rib radiographs 09/06/2012.  Findings: There is slight irregularity of the right sixth and seventh ribs anterolaterally on the oblique view which it is unchanged and could  reflect subacute fractures.  There is no displaced fracture.  There is mild right basilar atelectasis. Cardiomegaly is unchanged.  There is no pneumothorax or significant pleural effusion.  IMPRESSION: Possible healing subacute rib fractures on the right with associated right basilar atelectasis.  No displaced fracture or pneumothorax.   Original Report Authenticated By: Carey Bullocks, M.D.      Diagnosis: Chest wall pain secondary to rib fracture    MDM  Patient presents to the ER with complaints of right-sided anterior chest pain. Patient has severe tenderness in the anterior lower ribs on the right side, where she is experiencing the pain. There is absolutely no tenderness in the right upper quadrant, however. I can deeply palpate in the right upper quadrant under the ribs without any tenderness. Patient did complain of some right shoulder pain earlier, but that has resolved. When she moves her shoulder, the rib hurts, but the shoulder is not painful to active or passive range of motion. Because of this, hepatic functions were ordered. She was found to have an elevated total bilirubin with normal transaminases. An ultrasound was performed to further evaluate. She does have a gallstone. Common bile duct is borderline enlarged. Reviewing the records further, however, revealed that this common bile duct dilation is chronic, Dr. Willaim Sheng. Her elevated total bilirubins have also been chronic.  Case was discussed with Dr. Ewing Schlein, on call for gastroenterology. Specifically we discussed the need for further evaluation of these findings, including MRCP. It was felt that the patient could be followed up as an outpatient. I do not feel  that the patient requires any surgical or operative intervention at this time. I do believe that her pain is explained by the rib fractures.  Chest x-ray with ribs shows likely healing rib fractures. As these subacute fractures are exactly where the patient's pain is, it is likely that this is causing the patient's pain. She likely exacerbated the injury while sleeping. Patient provided analgesia.      Gilda Crease, MD 10/20/12 1051

## 2012-10-20 NOTE — ED Notes (Signed)
Daughter Contact information- Elenore Paddy [phone]

## 2012-11-13 ENCOUNTER — Other Ambulatory Visit: Payer: Self-pay | Admitting: Nurse Practitioner

## 2012-11-13 ENCOUNTER — Other Ambulatory Visit: Payer: Self-pay | Admitting: Cardiology

## 2012-11-14 NOTE — Telephone Encounter (Signed)
..   Requested Prescriptions   Pending Prescriptions Disp Refills  . clopidogrel (PLAVIX) 75 MG tablet [Pharmacy Med Name: CLOPIDOGREL 75MG  TAB] 30 tablet 3    Sig: TAKE 1 TABLET (75 MG TOTAL) BY MOUTH DAILY.

## 2012-12-15 ENCOUNTER — Other Ambulatory Visit: Payer: Self-pay | Admitting: Cardiology

## 2013-02-26 ENCOUNTER — Ambulatory Visit (INDEPENDENT_AMBULATORY_CARE_PROVIDER_SITE_OTHER): Payer: Medicare Other | Admitting: Cardiology

## 2013-02-26 ENCOUNTER — Encounter: Payer: Self-pay | Admitting: Cardiology

## 2013-02-26 VITALS — BP 104/64 | HR 91 | Ht 66.0 in | Wt 145.4 lb

## 2013-02-26 DIAGNOSIS — E785 Hyperlipidemia, unspecified: Secondary | ICD-10-CM

## 2013-02-26 DIAGNOSIS — I6350 Cerebral infarction due to unspecified occlusion or stenosis of unspecified cerebral artery: Secondary | ICD-10-CM

## 2013-02-26 DIAGNOSIS — I255 Ischemic cardiomyopathy: Secondary | ICD-10-CM

## 2013-02-26 DIAGNOSIS — I639 Cerebral infarction, unspecified: Secondary | ICD-10-CM

## 2013-02-26 DIAGNOSIS — I2589 Other forms of chronic ischemic heart disease: Secondary | ICD-10-CM

## 2013-02-26 MED ORDER — DIGOXIN 125 MCG PO TABS: ORAL_TABLET | ORAL | Status: DC

## 2013-02-26 MED ORDER — CLOPIDOGREL BISULFATE 75 MG PO TABS: ORAL_TABLET | ORAL | Status: DC

## 2013-02-26 MED ORDER — POTASSIUM CHLORIDE CRYS ER 10 MEQ PO TBCR: EXTENDED_RELEASE_TABLET | ORAL | Status: DC

## 2013-02-26 MED ORDER — LISINOPRIL 2.5 MG PO TABS: 2.5000 mg | ORAL_TABLET | Freq: Every day | ORAL | Status: DC

## 2013-02-26 MED ORDER — FUROSEMIDE 20 MG PO TABS: ORAL_TABLET | ORAL | Status: DC

## 2013-02-26 MED ORDER — ATORVASTATIN CALCIUM 10 MG PO TABS: 10.0000 mg | ORAL_TABLET | Freq: Every day | ORAL | Status: DC

## 2013-02-26 NOTE — Progress Notes (Signed)
HPI The patient presents for followup of an ischemic cardiomyopathy.  She continues to looks remarkably well for her age. She gets along and does her own chores. She denies any chest pressure, neck or arm discomfort. He denies any palpitations, presyncope or syncope. She has no PND or orthopnea. She has no weight gain or edema.  She did have a fall with fractured ribs in the spring.  She has some occasional problems with balance.  Allergies  Allergen Reactions  . Procaine Hcl Hypertension    PASSED OUT    Current Outpatient Prescriptions  Medication Sig Dispense Refill  . atorvastatin (LIPITOR) 10 MG tablet Take 10 mg by mouth daily.      . clopidogrel (PLAVIX) 75 MG tablet TAKE 1 TABLET (75 MG TOTAL) BY MOUTH DAILY.  30 tablet  3  . digoxin (LANOXIN) 0.125 MG tablet TAKE 1 TABLET (0.125 MG TOTAL) BY MOUTH DAILY.  30 tablet  6  . furosemide (LASIX) 20 MG tablet TAKE 1 TABLET (20 MG TOTAL) BY MOUTH DAILY.  30 tablet  6  . levothyroxine (SYNTHROID, LEVOTHROID) 75 MCG tablet Take 75 mcg by mouth daily.      Marland Kitchen lisinopril (PRINIVIL,ZESTRIL) 2.5 MG tablet Take 1 tablet (2.5 mg total) by mouth daily.  30 tablet  9  . Naproxen Sodium (ALEVE PO) Take by mouth as needed.      . nitroGLYCERIN (NITRODUR - DOSED IN MG/24 HR) 0.2 mg/hr PLACE 1 PATCH (0.2 MG TOTAL) ONTO THE SKIN AT BEDTIME DAILY  30 patch  5  . nitroGLYCERIN (NITROSTAT) 0.4 MG SL tablet Place 0.4 mg under the tongue every 5 (five) minutes as needed. For chest pain      . potassium chloride (K-DUR,KLOR-CON) 10 MEQ tablet TAKE 1 TABLET (10 MEQ TOTAL) BY MOUTH DAILY.  30 tablet  5   No current facility-administered medications for this visit.    Past Medical History  Diagnosis Date  . Ischemic cardiomyopathy June 2009    managed medically; EF is 20 to 25%  . 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 Hypothyroidism   . Hyperlipemia   . Advanced age   . Grief     from husband's death  . Left bundle branch block   .  Cardiac arrest 2007    Occurred during planned ovarian surgery that resolved spontaneously after her stomach was deflated.   . Stroke   . Left heart failure     EF is 20 to 25%; She is managed conservatively  . CVA (cerebral infarction) Oct 2010    "light stroke". Plavix started.    Past Surgical History  Procedure Laterality Date  . Dilation and curettage of uterus    . Cataract extraction  09/18/2012    right eye    ROS:  As stated in the HPI and negative for all other systems.  PHYSICAL EXAM BP 104/64  Pulse 91  Ht 5\' 6"  (1.676 m)  Wt 145 lb 6.4 oz (65.953 kg)  BMI 23.48 kg/m2 GENERAL:  Well appearing for her age HEENT:  Pupils equal round and reactive, fundi not visualized, oral mucosa unremarkable NECK:  No jugular venous distention, waveform within normal limits, carotid upstroke brisk and symmetric, no bruits, no thyromegaly LUNGS:  Clear to auscultation bilaterally BACK:  No CVA tenderness CHEST:  Unremarkable HEART:  PMI not displaced or sustained,S1 and S2 within normal limits, no S3, no S4, no clicks, no rubs, no murmurs ABD:  Flat, positive bowel sounds normal in  frequency in pitch, no bruits, no rebound, no guarding, no midline pulsatile mass, no hepatomegaly, no splenomegaly EXT:  2 plus pulses throughout, no edema, no cyanosis no clubbing   EKG:  NSR rate 91, LAD, LBBB 02/26/2013  ASSESSMENT AND PLAN  Ischemic cardiomyopathy -  She wants conservative management.  We are not considering an ICD. No change in therapy is indicated. She seems to be euvolemic.  HTN - Her blood pressure is mildly elevated today.  However, I reviewed previous labs and it is quite labile.  I will not change her meds as she feels well.

## 2013-02-26 NOTE — Patient Instructions (Addendum)
The current medical regimen is effective;  continue present plan and medications.  Follow up with Norma Fredrickson in 6 months.  Follow up in 1 year with Dr Antoine Poche.  You will receive a letter in the mail 2 months before you are due.  Please call us when you receive this letter to schedule your follow up appointment.

## 2013-03-28 ENCOUNTER — Other Ambulatory Visit: Payer: Self-pay | Admitting: Cardiology

## 2013-04-15 ENCOUNTER — Other Ambulatory Visit (HOSPITAL_COMMUNITY): Payer: Self-pay | Admitting: Internal Medicine

## 2013-04-15 DIAGNOSIS — Z1231 Encounter for screening mammogram for malignant neoplasm of breast: Secondary | ICD-10-CM

## 2013-04-17 ENCOUNTER — Other Ambulatory Visit: Payer: Self-pay | Admitting: Nurse Practitioner

## 2013-04-23 ENCOUNTER — Ambulatory Visit (HOSPITAL_COMMUNITY)
Admission: RE | Admit: 2013-04-23 | Discharge: 2013-04-23 | Disposition: A | Discharge status: Home / Self Care | Payer: Medicare Other | Source: Ambulatory Visit | Attending: Internal Medicine | Admitting: Internal Medicine

## 2013-04-23 DIAGNOSIS — Z1231 Encounter for screening mammogram for malignant neoplasm of breast: Secondary | ICD-10-CM | POA: Insufficient documentation

## 2013-04-29 ENCOUNTER — Other Ambulatory Visit: Payer: Self-pay | Admitting: Internal Medicine

## 2013-04-29 DIAGNOSIS — R928 Other abnormal and inconclusive findings on diagnostic imaging of breast: Secondary | ICD-10-CM

## 2013-05-02 ENCOUNTER — Other Ambulatory Visit: Payer: Self-pay | Admitting: *Deleted

## 2013-05-02 MED ORDER — NITROGLYCERIN 0.4 MG SL SUBL: 0.4000 mg | SUBLINGUAL_TABLET | SUBLINGUAL | Status: DC | PRN

## 2013-05-13 ENCOUNTER — Ambulatory Visit
Admission: RE | Admit: 2013-05-13 | Discharge: 2013-05-13 | Disposition: A | Discharge status: Home / Self Care | Payer: Medicare Other | Source: Ambulatory Visit | Attending: Internal Medicine | Admitting: Internal Medicine

## 2013-05-13 DIAGNOSIS — R928 Other abnormal and inconclusive findings on diagnostic imaging of breast: Secondary | ICD-10-CM

## 2013-06-20 ENCOUNTER — Other Ambulatory Visit: Payer: Self-pay | Admitting: Cardiology

## 2013-07-19 ENCOUNTER — Other Ambulatory Visit: Payer: Self-pay | Admitting: Nurse Practitioner

## 2013-07-26 ENCOUNTER — Other Ambulatory Visit: Payer: Self-pay | Admitting: Cardiology

## 2013-08-17 ENCOUNTER — Other Ambulatory Visit: Payer: Self-pay | Admitting: Nurse Practitioner

## 2013-08-18 ENCOUNTER — Other Ambulatory Visit: Payer: Self-pay | Admitting: Cardiology

## 2013-09-02 ENCOUNTER — Encounter: Payer: Self-pay | Admitting: Nurse Practitioner

## 2013-09-02 ENCOUNTER — Ambulatory Visit (INDEPENDENT_AMBULATORY_CARE_PROVIDER_SITE_OTHER): Payer: Medicare Other | Admitting: Nurse Practitioner

## 2013-09-02 VITALS — BP 110/70 | HR 83 | Ht 66.0 in | Wt 145.4 lb

## 2013-09-02 DIAGNOSIS — I2589 Other forms of chronic ischemic heart disease: Secondary | ICD-10-CM

## 2013-09-02 DIAGNOSIS — I255 Ischemic cardiomyopathy: Secondary | ICD-10-CM

## 2013-09-02 NOTE — Progress Notes (Signed)
[name] Date of Birth: Jun 08, 1917 Medical Record #[id number]  History of Present Illness: [name] is seen back today for her 6 month check. Seen for Dr. Antoine PocheHochrein. She is now 80 years of age. She has an ischemic CM and is managed conservatively. EF is 25 to 30% per echo back in March of 2012. Other issues include prior stroke in December of 2012. She is on chronic Plavix. She has a chronic LBBB, hypothyroidism and HLD.   Last seen here in August - was doing well.   Comes in today. Here alone. She is doing well for her age. Notes that her knees are given out. Using a cane. [place]arey [place] and [place] ribs.  She is otherwise doing ok. No chest pain. Not short of breath. No dizziness. No passing out spells. No swelling. Breathing is good. Feels ok on her medicines. She is seeing her PCP later this week and thinks she had all of her labs done last month - she will check with him at her visit.    Current Outpatient Prescriptions  Medication Sig Dispense Refill  . atorvastatin (LIPITOR) 10 MG tablet Take 1 tablet (10 mg total) by mouth daily.  90 tablet  3  . clopidogrel (PLAVIX) 75 MG tablet TAKE 1 TABLET (75 MG TOTAL) BY MOUTH DAILY.  90 tablet  3  . digoxin (LANOXIN) 0.125 MG tablet TAKE 1 TABLET (0.125 MG TOTAL) BY MOUTH DAILY.  90 tablet  3  . furosemide (LASIX) 20 MG tablet TAKE 1 TABLET (20 MG TOTAL) BY MOUTH DAILY.  90 tablet  3  . levothyroxine (SYNTHROID, LEVOTHROID) 75 MCG tablet Take 75 mcg by mouth daily.      Marland Kitchen. lisinopril (PRINIVIL,ZESTRIL) 2.5 MG tablet Take 1 tablet (2.5 mg total) by mouth daily.  90 tablet  3  . Naproxen Sodium (ALEVE PO) Take by mouth as needed.      . nitroGLYCERIN (NITRODUR - DOSED IN MG/24 HR) 0.2 mg/hr patch PLACE ONE PATCH ONTO SKIN ONCE DAILY AT BEDTIME  30 patch  2  . nitroGLYCERIN (NITROSTAT) 0.4 MG SL tablet Place 1 tablet (0.4 mg total) under the tongue every 5 (five) minutes as needed. For chest pain  25 tablet  5  .  potassium chloride (K-DUR,KLOR-CON) 10 MEQ tablet TAKE 1 TABLET (10 MEQ TOTAL) BY MOUTH DAILY.  90 tablet  3   No current facility-administered medications for this visit.    Allergies  Allergen Reactions  . Procaine Hcl Hypertension    PASSED OUT    Past Medical History  Diagnosis Date  . Ischemic cardiomyopathy June 2009    managed medically; EF is 20 to 25%  . 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 Hypothyroidism   . Hyperlipemia   . Advanced age   . Grief     from husband's death  . Left bundle branch block   . Cardiac arrest 2007    Occurred during planned ovarian surgery that resolved spontaneously after her stomach was deflated.   . Stroke   . Left heart failure     EF is 20 to 25%; She is managed conservatively  . CVA (cerebral infarction) Oct 2010    "light stroke". Plavix started.    Past Surgical History  Procedure Laterality Date  . Dilation and curettage of uterus    . Cataract extraction  09/18/2012    right eye    History  Smoking status  . Former Smoker  .  Quit date: 07/11/1968  Smokeless tobacco  . Never Used    History  Alcohol Use No    Family History  Problem Relation Age of Onset  . Aneurysm Father   . Heart attack Mother     Review of Systems: The review of systems is per the HPI.  All other systems were reviewed and are negative.  Physical Exam: BP 110/70  Pulse 83  Ht 5\' 6"  (1.676 m)  Wt 145 lb 6.4 oz (65.953 kg)  BMI 23.48 kg/m2  SpO2 96% Patient is very pleasant and in no acute distress. Looks younger than her stated age. Skin is warm and dry. Color is normal.  HEENT is unremarkable. Normocephalic/atraumatic. PERRL. Sclera are nonicteric. Neck is supple. No masses. No JVD. Lungs are clear. Cardiac exam shows a regular rate and rhythm. Split S2. Abdomen is soft. Extremities are without edema. Gait and ROM are intact. Using a cane. No gross neurologic deficits noted.  Wt Readings from Last 3 Encounters:  09/02/13  145 lb 6.4 oz (65.953 kg)  02/26/13 145 lb 6.4 oz (65.953 kg)  08/27/12 149 lb 12.8 oz (67.949 kg)     LABORATORY DATA: Lab Results  Component Value Date   WBC 7.1 10/20/2012   HGB 11.9* 10/20/2012   HCT 35.0* 10/20/2012   PLT 132* 10/20/2012   GLUCOSE 107* 10/20/2012   CHOL 157 08/27/2012   TRIG 158.0* 08/27/2012   HDL 56.00 08/27/2012   LDLCALC 69 08/27/2012   ALT 11 10/20/2012   AST 18 10/20/2012   NA 137 10/20/2012   K 4.1 10/20/2012   CL 101 10/20/2012   CREATININE 1.12* 10/20/2012   BUN 21 10/20/2012   CO2 28 10/20/2012   TSH 0.70 08/27/2012   INR 1.04 04/19/2011   HGBA1C  Value: 5.7 (NOTE)   The ADA recommends the following therapeutic goals for glycemic   control related to Hgb A1C measurement:   Goal of Therapy:   < 7.0% Hgb A1C   Action Suggested:  > 8.0% Hgb A1C   Ref:  Diabetes Care, 22, Suppl. 1, 1999 12/20/2007     Assessment / Plan: 1. Ischemic CM - EF is 25 to 30% - managed conservatively - she continues to do very well and is asymptomatic. I have left her on her current regimen.   2. HLD - on low dose statin therapy - she feels like she has had her labs done and will check with Dr. Su Hilt at her visit with him later this week.  3. Advanced age   50. Chronic LBBB   5. HTN - blood pressure looks ok. No change in her current regimen.   6. Falls  I will see her in 6 months. No change in therapy.   Patient is agreeable to this plan and will call if any problems develop in the interim.   Rosalio Macadamia, RN, ANP-C [facility] [address] [place], [place]  [address] [phone]

## 2013-09-02 NOTE — Patient Instructions (Addendum)
I think you are doing well  Keep using your cane and be safe  I will see you in 6 months  Ask Dr. Su Hiltoberts to send me a copy of your labs  Stay on your current medicines  Call the [facility] office at [phone] if you have any questions, problems or concerns.

## 2013-09-09 ENCOUNTER — Telehealth: Payer: Self-pay | Admitting: Nurse Practitioner

## 2013-09-09 NOTE — Telephone Encounter (Signed)
New problem   Pt just want to know if you received lab report from [facility].

## 2013-09-09 NOTE — Telephone Encounter (Signed)
S/w pt will bring lab results to office today

## 2013-09-12 ENCOUNTER — Telehealth: Payer: Self-pay | Admitting: Nurse Practitioner

## 2013-09-12 NOTE — Telephone Encounter (Signed)
Called patient back. She states that lab work was mailed and faxed today. Advised Danielle not back until next week on 3/9 and will call her back if labs have not been received.

## 2013-09-12 NOTE — Telephone Encounter (Signed)
New message     Want to know if [name] got the lab work faxed from the patient.  If not call her and she will bring it to you.

## 2013-09-16 NOTE — Telephone Encounter (Signed)
S/w pt wanted to know if we received labs from Dr. Ronald Roberts office @ [phone] I stated we did not Pt wanted to bring them to us I stated I will call doctor's office and get the lab results faxed over to our office. Pt stated verbal understanding and appreciation. I called Dr. Windy Fastonald Robert's office at number mentioned above and s/w Elnita MaxwellCheryl will fax copy over

## 2013-09-20 ENCOUNTER — Telehealth: Payer: Self-pay | Admitting: *Deleted

## 2013-09-20 NOTE — Telephone Encounter (Signed)
S/w pt who sent in lab results to FYI that Lawson FiscalLori reviewed and everything was ok

## 2013-10-30 ENCOUNTER — Other Ambulatory Visit: Payer: Self-pay

## 2013-10-30 MED ORDER — DIGOXIN 125 MCG PO TABS: ORAL_TABLET | ORAL | Status: DC

## 2013-11-04 ENCOUNTER — Telehealth: Payer: Self-pay | Admitting: Cardiology

## 2013-11-04 NOTE — Telephone Encounter (Signed)
Left message for pt OK if manufacturer has changed - just continue current dose.  Requested she call back with further concerns.

## 2013-11-04 NOTE — Telephone Encounter (Signed)
New message     FYI Want doctor to know that her digoxin 0.125 has been changed to a different manufacturer.  The pharmacist told her to let her doctor know.

## 2013-12-04 ENCOUNTER — Other Ambulatory Visit: Payer: Self-pay | Admitting: Cardiology

## 2013-12-27 NOTE — Telephone Encounter (Signed)
complete

## 2014-01-07 ENCOUNTER — Other Ambulatory Visit: Payer: Self-pay | Admitting: Nurse Practitioner

## 2014-01-14 ENCOUNTER — Telehealth: Payer: Self-pay | Admitting: *Deleted

## 2014-01-14 ENCOUNTER — Telehealth: Payer: Self-pay | Admitting: Nurse Practitioner

## 2014-01-14 NOTE — Telephone Encounter (Signed)
Ok to stay off her Lisinopril.  I would like her to monitor her BP and call in readings in the next few weeks to review.

## 2014-01-14 NOTE — Telephone Encounter (Signed)
Do not have any info about this. Does she know why it was stopped.

## 2014-01-14 NOTE — Telephone Encounter (Signed)
Closed last note in error.  Asked pt if knew why Dr. Su Hiltoberts stopped pt's lisinopril stated did not remember.  Stated to call office and get last office note.  S/w BJ @ [phone] and will fax Dr. Su Hiltoberts last ov note over

## 2014-01-14 NOTE — Telephone Encounter (Signed)
New Message  Pt called. Requesting call back to discuss lisinopril, Pt reports she  is not sure if she is to continue taking it. Please call back to discuss. (Pt requests that this message is directed to the office of NP Norma FredricksonLori Gerhardt)

## 2014-01-14 NOTE — Telephone Encounter (Signed)
S/w pt is aware to stop lisinopril and take bp readings on a regular basis twice a day.  Pt Will get bp cuff checked and  call me with readings in a few weeks. Gave pt direct extension today and already called me confused about medication.

## 2014-01-14 NOTE — Telephone Encounter (Signed)
New message    Dr Su Hiltoberts changed lisinopril because of bp readings.  10-03-13 bp 112/60; 10-08-13 120/62; 01-07-14 92/60 and 01-13-14 120/78.  They called in bp readings instead of faxing notes.

## 2014-01-14 NOTE — Telephone Encounter (Signed)
Pt stated went to see PCP yesterday, Dr. Su Hiltoberts, and stated doctor took pt off of lisinopril and stated the Dr.Roberts would t/w Lawson FiscalLori.  [facility] and make sure it was ok to stop medication. Will route to [place] to advise

## 2014-01-16 ENCOUNTER — Telehealth: Payer: Self-pay | Admitting: *Deleted

## 2014-01-16 NOTE — Telephone Encounter (Signed)
Pt called to give bp readings; am 131/89 HR 89, BP pm 107/79 HR 101

## 2014-01-17 NOTE — Telephone Encounter (Signed)
New message    Patient calling Dr. Molly Maduroobert inform patient to stop taken lisinopril

## 2014-01-17 NOTE — Telephone Encounter (Signed)
S/w pt is aware we already discussed pt d/c of lisinopril.  Pt apologized and was confused.  Stated bp was 136/85  Stated to call in a couple of weeks to give more bp readings.  Stated feels fine.  Pt agreeable to plan

## 2014-01-23 NOTE — Telephone Encounter (Signed)
Close encounter 

## 2014-01-26 ENCOUNTER — Other Ambulatory Visit: Payer: Self-pay | Admitting: Cardiology

## 2014-01-27 NOTE — Telephone Encounter (Signed)
Rosalio MacadamiaLori C Gerhardt, NP at 09/02/2013 11:19 AM  clopidogrel (PLAVIX) 75 MG tablet  TAKE 1 TABLET (75 MG TOTAL) BY MOUTH DAILY.   90 tablet   Stay on your current medicines

## 2014-01-28 ENCOUNTER — Other Ambulatory Visit: Payer: Self-pay | Admitting: *Deleted

## 2014-01-28 NOTE — Telephone Encounter (Signed)
S/w pt received a fax of bp readings, Lawson FiscalLori advised and stated to keep doing what pt is doing .  No need to add any medications at this time.

## 2014-02-06 ENCOUNTER — Telehealth: Payer: Self-pay | Admitting: Cardiology

## 2014-02-06 ENCOUNTER — Other Ambulatory Visit: Payer: Self-pay

## 2014-02-06 MED ORDER — NITROGLYCERIN 0.2 MG/HR TD PT24: MEDICATED_PATCH | TRANSDERMAL | Status: DC

## 2014-02-06 NOTE — Telephone Encounter (Signed)
New message          Pt would like for dr hochrein to know that her pharmacy change manufactures for her dioxin

## 2014-02-07 NOTE — Telephone Encounter (Signed)
ok 

## 2014-02-27 ENCOUNTER — Other Ambulatory Visit: Payer: Self-pay | Admitting: Cardiology

## 2014-03-03 ENCOUNTER — Ambulatory Visit (INDEPENDENT_AMBULATORY_CARE_PROVIDER_SITE_OTHER): Payer: Medicare Other | Admitting: Nurse Practitioner

## 2014-03-03 ENCOUNTER — Encounter: Payer: Self-pay | Admitting: Nurse Practitioner

## 2014-03-03 VITALS — BP 120/80 | HR 80 | Ht 66.0 in | Wt 146.8 lb

## 2014-03-03 DIAGNOSIS — I10 Essential (primary) hypertension: Secondary | ICD-10-CM

## 2014-03-03 DIAGNOSIS — I2589 Other forms of chronic ischemic heart disease: Secondary | ICD-10-CM

## 2014-03-03 DIAGNOSIS — I255 Ischemic cardiomyopathy: Secondary | ICD-10-CM

## 2014-03-03 DIAGNOSIS — E785 Hyperlipidemia, unspecified: Secondary | ICD-10-CM

## 2014-03-03 NOTE — Progress Notes (Signed)
[name] Date of Birth: Jan 17, 1917 Medical Record #[id number]  History of Present Illness: [name] is seen back today for her 6 month check. Seen for Dr. Antoine Poche. She is now 80 years of age. She has an ischemic CM and is managed conservatively. EF is 25 to 30% per echo back in March of 2012. Other issues include prior stroke in December of 2012. She was previously on chronic Plavix. She has a chronic LBBB, hypothyroidism and HLD.   Last seen here in February - was doing well.   Comes in today. Here alone. She continues to do well for her age. Notes that her knees are giving out. Using a cane. She has been taken off of her ACE due to low BP. No longer on her Plavix as well - now just on aspirin. Conservative approach. She has had a good summer - went to [address] and had a good time. She still drives short distances.   Current Outpatient Prescriptions  Medication Sig Dispense Refill  . aspirin EC 81 MG tablet Take 81 mg by mouth daily.      Marland Kitchen atorvastatin (LIPITOR) 10 MG tablet Take 1 tablet (10 mg total) by mouth daily.  90 tablet  3  . digoxin (LANOXIN) 0.125 MG tablet TAKE 1 TABLET (0.125 MG TOTAL) BY MOUTH DAILY.  90 tablet  3  . furosemide (LASIX) 20 MG tablet TAKE 1 TABLET (20 MG TOTAL) BY MOUTH DAILY.  90 tablet  3  . levothyroxine (SYNTHROID, LEVOTHROID) 75 MCG tablet Take 75 mcg by mouth daily.      . Naproxen Sodium (ALEVE PO) Take by mouth as needed.      . nitroGLYCERIN (NITROSTAT) 0.4 MG SL tablet Place 1 tablet (0.4 mg total) under the tongue every 5 (five) minutes as needed. For chest pain  25 tablet  5  . potassium chloride (K-DUR,KLOR-CON) 10 MEQ tablet TAKE 1 TABLET (10 MEQ TOTAL) BY MOUTH DAILY.  90 tablet  3   No current facility-administered medications for this visit.    Allergies  Allergen Reactions  . Procaine Hcl Hypertension    PASSED OUT    Past Medical History  Diagnosis Date  . Ischemic cardiomyopathy June 2009    managed medically; EF is 20  to 25%  . 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 Hypothyroidism   . Hyperlipemia   . Advanced age   . Grief     from husband's death  . Left bundle branch block   . Cardiac arrest 2007    Occurred during planned ovarian surgery that resolved spontaneously after her stomach was deflated.   . Stroke   . Left heart failure     EF is 20 to 25%; She is managed conservatively  . CVA (cerebral infarction) Oct 2010    "light stroke". Plavix started.    Past Surgical History  Procedure Laterality Date  . Dilation and curettage of uterus    . Cataract extraction  09/18/2012    right eye    History  Smoking status  . Former Smoker  . Quit date: 07/11/1968  Smokeless tobacco  . Never Used    History  Alcohol Use No    Family History  Problem Relation Age of Onset  . Aneurysm Father   . Heart attack Mother     Review of Systems: The review of systems is per the HPI.  All other systems were reviewed and are negative.  Physical Exam: BP 120/80  Pulse  80  Ht  (1.676 m)  Wt 146 lb 12.8 oz (66.588 kg)  BMI 23.71 kg/m2  SpO2 95% Patient is very pleasant and in no acute distress. Her weight is stable. Skin is warm and dry. Color is normal.  HEENT is unremarkable but she is hard of hearing. Normocephalic/atraumatic. PERRL. Sclera are nonicteric. Neck is supple. No masses. No JVD. Lungs are clear. Cardiac exam shows a regular rate and rhythm. Abdomen is soft. Extremities are without edema. Gait and ROM are intact. No gross neurologic deficits noted.  Wt Readings from Last 3 Encounters:  03/03/14 146 lb 12.8 oz (66.588 kg)  09/02/13 145 lb 6.4 oz (65.953 kg)  02/26/13 145 lb 6.4 oz (65.953 kg)    LABORATORY DATA/PROCEDURES:  Lab Results  Component Value Date   WBC 7.1 10/20/2012   HGB 11.9* 10/20/2012   HCT 35.0* 10/20/2012   PLT 132* 10/20/2012   GLUCOSE 107* 10/20/2012   CHOL 157 08/27/2012   TRIG 158.0* 08/27/2012   HDL 56.00 08/27/2012   LDLCALC 69  08/27/2012   ALT 11 10/20/2012   AST 18 10/20/2012   NA 137 10/20/2012   K 4.1 10/20/2012   CL 101 10/20/2012   CREATININE 1.12* 10/20/2012   BUN 21 10/20/2012   CO2 28 10/20/2012   TSH 0.70 08/27/2012   INR 1.04 04/19/2011   HGBA1C  Value: 5.7 (NOTE)   The ADA recommends the following therapeutic goals for glycemic   control related to Hgb A1C measurement:   Goal of Therapy:   < 7.0% Hgb A1C   Action Suggested:  > 8.0% Hgb A1C   Ref:  Diabetes Care, 22, Suppl. 1, 1999 12/20/2007    BNP (last 3 results) No results found for this basename: PROBNP,  in the last 8760 hours   Assessment / Plan: 1. Ischemic CM - EF is 25 to 30% - managed conservatively - she continues to do very well and is asymptomatic. I have left her on her current regimen.   2. HLD - on low dose statin therapy - she says she has had her labs checked recently with PCP  3. Advanced age   46. Chronic LBBB   5. HTN - blood pressure looks ok. No change in her current regimen.   6. Falls   7. Prior stroke - back on aspirin therapy   She is felt to be doing ok. See back in 6 months.  Patient is agreeable to this plan and will call if any problems develop in the interim.   Rosalio Macadamia, RN, ANP-C [facility] [address] [place], [place]  [address] [phone]

## 2014-03-03 NOTE — Patient Instructions (Signed)
Stay on your current medicines  I will see you in 6 months  Call the [facility] office at [phone] if you have any questions, problems or concerns.   

## 2014-03-26 ENCOUNTER — Other Ambulatory Visit (HOSPITAL_COMMUNITY): Payer: Self-pay | Admitting: Internal Medicine

## 2014-03-26 DIAGNOSIS — Z1231 Encounter for screening mammogram for malignant neoplasm of breast: Secondary | ICD-10-CM

## 2014-04-08 ENCOUNTER — Other Ambulatory Visit: Payer: Self-pay | Admitting: Cardiology

## 2014-04-10 ENCOUNTER — Encounter (HOSPITAL_COMMUNITY): Payer: Self-pay | Admitting: Emergency Medicine

## 2014-04-10 ENCOUNTER — Emergency Department (HOSPITAL_COMMUNITY)
Admission: EM | Admit: 2014-04-10 | Discharge: 2014-04-10 | Disposition: A | Discharge status: Home / Self Care | Payer: Medicare Other | Attending: Emergency Medicine | Admitting: Emergency Medicine

## 2014-04-10 ENCOUNTER — Emergency Department (HOSPITAL_COMMUNITY): Payer: Medicare Other

## 2014-04-10 DIAGNOSIS — Z8659 Personal history of other mental and behavioral disorders: Secondary | ICD-10-CM | POA: Insufficient documentation

## 2014-04-10 DIAGNOSIS — I252 Old myocardial infarction: Secondary | ICD-10-CM | POA: Diagnosis not present

## 2014-04-10 DIAGNOSIS — E039 Hypothyroidism, unspecified: Secondary | ICD-10-CM | POA: Insufficient documentation

## 2014-04-10 DIAGNOSIS — R251 Tremor, unspecified: Secondary | ICD-10-CM | POA: Insufficient documentation

## 2014-04-10 DIAGNOSIS — Z7982 Long term (current) use of aspirin: Secondary | ICD-10-CM | POA: Insufficient documentation

## 2014-04-10 DIAGNOSIS — E785 Hyperlipidemia, unspecified: Secondary | ICD-10-CM | POA: Insufficient documentation

## 2014-04-10 DIAGNOSIS — Z8781 Personal history of (healed) traumatic fracture: Secondary | ICD-10-CM | POA: Insufficient documentation

## 2014-04-10 DIAGNOSIS — Z79899 Other long term (current) drug therapy: Secondary | ICD-10-CM | POA: Diagnosis not present

## 2014-04-10 LAB — COMPREHENSIVE METABOLIC PANEL
ALBUMIN: 3.6 g/dL (ref 3.5–5.2)
ALT: 12 U/L (ref 0–35)
AST: 21 U/L (ref 0–37)
Alkaline Phosphatase: 62 U/L (ref 39–117)
Anion gap: 11 (ref 5–15)
BUN: 28 mg/dL — ABNORMAL HIGH (ref 6–23)
CHLORIDE: 106 meq/L (ref 96–112)
CO2: 26 mEq/L (ref 19–32)
CREATININE: 1.05 mg/dL (ref 0.50–1.10)
Calcium: 8.8 mg/dL (ref 8.4–10.5)
GFR calc Af Amer: 51 mL/min — ABNORMAL LOW (ref >90)
GFR calc non Af Amer: 44 mL/min — ABNORMAL LOW (ref >90)
Glucose, Bld: 111 mg/dL — ABNORMAL HIGH (ref 70–99)
Potassium: 4.1 mEq/L (ref 3.7–5.3)
Sodium: 143 mEq/L (ref 137–147)
TOTAL PROTEIN: 6.3 g/dL (ref 6.0–8.3)
Total Bilirubin: 1 mg/dL (ref 0.3–1.2)

## 2014-04-10 LAB — CBC WITH DIFFERENTIAL/PLATELET
BASOS ABS: 0 10*3/uL (ref 0.0–0.1)
BASOS PCT: 0 % (ref 0–1)
EOS ABS: 0 10*3/uL (ref 0.0–0.7)
Eosinophils Relative: 1 % (ref 0–5)
HEMATOCRIT: 36.5 % (ref 36.0–46.0)
Hemoglobin: 11.9 g/dL — ABNORMAL LOW (ref 12.0–15.0)
Lymphocytes Relative: 29 % (ref 12–46)
Lymphs Abs: 2 10*3/uL (ref 0.7–4.0)
MCH: 30.7 pg (ref 26.0–34.0)
MCHC: 32.6 g/dL (ref 30.0–36.0)
MCV: 94.1 fL (ref 78.0–100.0)
MONO ABS: 0.5 10*3/uL (ref 0.1–1.0)
Monocytes Relative: 8 % (ref 3–12)
NEUTROS ABS: 4.3 10*3/uL (ref 1.7–7.7)
Neutrophils Relative %: 62 % (ref 43–77)
Platelets: 127 10*3/uL — ABNORMAL LOW (ref 150–400)
RBC: 3.88 MIL/uL (ref 3.87–5.11)
RDW: 13.7 % (ref 11.5–15.5)
WBC: 6.9 10*3/uL (ref 4.0–10.5)

## 2014-04-10 LAB — URINALYSIS, ROUTINE W REFLEX MICROSCOPIC
Bilirubin Urine: NEGATIVE
Glucose, UA: NEGATIVE mg/dL
Hgb urine dipstick: NEGATIVE
Ketones, ur: NEGATIVE mg/dL
LEUKOCYTES UA: NEGATIVE
NITRITE: NEGATIVE
PH: 6 (ref 5.0–8.0)
Protein, ur: NEGATIVE mg/dL
Specific Gravity, Urine: 1.014 (ref 1.005–1.030)
Urobilinogen, UA: 0.2 mg/dL (ref 0.0–1.0)

## 2014-04-10 LAB — I-STAT CG4 LACTIC ACID, ED: Lactic Acid, Venous: 0.98 mmol/L (ref 0.5–2.2)

## 2014-04-10 NOTE — ED Notes (Signed)
Pt states that when she woke up this morning she felt "shaky" and "not herself". Pt states she woke up shaking, with her ears popping, and states that she couldn't think right when she woke up. EMS did a 12 lead and found a LBBB, 1st degree heart block, and a wide QRS. Pt with cardiac hx. Pt lives at home alone.

## 2014-04-10 NOTE — ED Notes (Signed)
Elenore PaddyJackie Thompson, Daughter [phone]

## 2014-04-10 NOTE — ED Notes (Signed)
Family notified of plan of care

## 2014-04-10 NOTE — ED Provider Notes (Signed)
CSN: [id number]     Arrival date & time 04/10/14  0602 History   First MD Initiated Contact with Patient 04/10/14 0703     Chief Complaint  Patient presents with  . Shaking     HPI  Patient presents after one episode of shaking. On my evaluation the patient is asymptomatic. She notes that she recently started a short course of steroids for acute on chronic low back pain. She currently denies abdominal pain, incontinence, dysuria, hematuria, chest pain, any focal complaints. Patient also notes that she recently completed a course of antibiotics for bilateral otitis. She states that she is generally well, though she endorses a history of cardiomyopathy.  No recent medication changes, diet changes, activity changes.   Past Medical History  Diagnosis Date  . Ischemic cardiomyopathy June 2009    managed medically; EF is 20 to 25%  . 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 Hypothyroidism   . Hyperlipemia   . Advanced age   . Grief     from husband's death  . Left bundle branch block   . Cardiac arrest 2007    Occurred during planned ovarian surgery that resolved spontaneously after her stomach was deflated.   . Stroke   . Left heart failure     EF is 20 to 25%; She is managed conservatively  . CVA (cerebral infarction) Oct 2010    "light stroke". Plavix started.   Past Surgical History  Procedure Laterality Date  . Dilation and curettage of uterus    . Cataract extraction  09/18/2012    right eye   Family History  Problem Relation Age of Onset  . Aneurysm Father   . Heart attack Mother    History  Substance Use Topics  . Smoking status: Former Smoker    Quit date: 07/11/1968  . Smokeless tobacco: Never Used  . Alcohol Use: No   OB History   Grav Para Term Preterm Abortions TAB SAB Ect Mult Living                 Review of Systems  Constitutional:       Per HPI, otherwise negative  HENT:       Per HPI, otherwise negative  Respiratory:       Per HPI,  otherwise negative  Cardiovascular:       Per HPI, otherwise negative  Gastrointestinal: Negative for vomiting.  Endocrine:       Negative aside from HPI  Genitourinary:       Neg aside from HPI   Musculoskeletal:       Per HPI, otherwise negative  Skin: Negative.   Neurological: Negative for syncope and weakness.      Allergies  Procaine hcl  Home Medications   Prior to Admission medications   Medication Sig Start Date End Date Taking? Authorizing Provider  aspirin EC 81 MG tablet Take 81 mg by mouth daily.   Yes Historical Provider, MD  atorvastatin (LIPITOR) 10 MG tablet Take 10 mg by mouth daily.   Yes Historical Provider, MD  digoxin (LANOXIN) 0.125 MG tablet Take by mouth daily.   Yes Historical Provider, MD  furosemide (LASIX) 20 MG tablet Take 20 mg by mouth daily.   Yes Historical Provider, MD  levothyroxine (SYNTHROID, LEVOTHROID) 75 MCG tablet Take 75 mcg by mouth daily.   Yes Historical Provider, MD  methylPREDNISolone (MEDROL) 4 MG tablet Take 4 mg by mouth 2 (two) times daily.   Yes Historical Provider, MD  nitroGLYCERIN (NITRODUR - DOSED IN MG/24 HR) 0.4 mg/hr patch Place 0.4 mg onto the skin daily.   Yes Historical Provider, MD  nitroGLYCERIN (NITROSTAT) 0.4 MG SL tablet Place 0.4 mg under the tongue every 5 (five) minutes as needed for chest pain.   Yes Historical Provider, MD  potassium chloride (K-DUR) 10 MEQ tablet Take 10 mEq by mouth daily.   Yes Historical Provider, MD   BP 144/72  Pulse 64  Temp(Src) 97.2 F (36.2 C) (Oral)  Resp 19  Ht 5\' 6"  (1.676 m)  Wt 141 lb (63.957 kg)  BMI 22.77 kg/m2  SpO2 95% Physical Exam  Nursing note and vitals reviewed. Constitutional: She is oriented to person, place, and time. She appears well-developed and well-nourished. No distress.  Elderly female who appears less than her chronologic age  HENT:  Head: Normocephalic and atraumatic.  Both ears have clear tympanic membranes, no bulging, injection, posterior  fluid  Eyes: Conjunctivae and EOM are normal.  Cardiovascular: Normal rate, regular rhythm and intact distal pulses.   Pulmonary/Chest: Effort normal and breath sounds normal. No stridor. No respiratory distress.  Abdominal: She exhibits no distension.  Musculoskeletal: She exhibits no edema.  Neurological: She is alert and oriented to person, place, and time. She displays no atrophy and no tremor. No cranial nerve deficit or sensory deficit. She exhibits normal muscle tone. She displays no seizure activity.  Skin: Skin is warm and dry.  Psychiatric: She has a normal mood and affect.    ED Course  Procedures (including critical care time) Labs Review Labs Reviewed  CBC WITH DIFFERENTIAL - Abnormal; Notable for the following:    Hemoglobin 11.9 (*)    Platelets 127 (*)    All other components within normal limits  COMPREHENSIVE METABOLIC PANEL - Abnormal; Notable for the following:    Glucose, Bld 111 (*)    BUN 28 (*)    GFR calc non Af Amer 44 (*)    GFR calc Af Amer 51 (*)    All other components within normal limits  URINE CULTURE  URINALYSIS, ROUTINE W REFLEX MICROSCOPIC  I-STAT CG4 LACTIC ACID, ED    Imaging Review Dg Chest 2 View  04/10/2014   CLINICAL DATA:  80 year old female with dizziness and weakness. Initial encounter.  EXAM: CHEST  2 VIEW  COMPARISON:  10/20/2012 and earlier.  FINDINGS: Stable cardiomegaly and mediastinal contours. Visualized tracheal air column is within normal limits. No pneumothorax or pulmonary edema. No pleural effusion or consolidation. No acute pulmonary opacity. Osteopenia. Stable visualized osseous structures. Chronic calcified atherosclerosis of the aorta.  IMPRESSION: Stable.  No acute cardiopulmonary abnormality.   Electronically Signed   By: Augusto GambleLee  Hall M.D.   On: 04/10/2014 07:34   EMS performed EKG, rate 77, left bundle branch block, T-wave abnormalities. This is unchanged from her most recent EKG with her cardiologist.   8:35 AM On  repeat exam the patient is awake, alert, with no complaints. She states that she is ready for departure.  MDM   Elderly female presents with one episode of shaking.  On exam she is awake, alert, in no distress.  She is hemodynamically stable, labs are unremarkable, there is no evidence for active infection. Symptoms maybe secondary to recent steroid use, versus occult infection, though this seems unlikely. Through a period of monitoring 4 hours in the emergency department the patient remained hemodynamically stable, with no new complaints. Patient was discharged to follow up with primary care.    Gerhard Munchobert Kaithlyn Teagle,  MD 04/10/14 [phone]

## 2014-04-10 NOTE — Discharge Instructions (Signed)
As discussed, your evaluation today has been largely reassuring.  But, it is important that you monitor your condition carefully, and do not hesitate to return to the ED if you develop new, or concerning changes in your condition. ? ?Otherwise, please follow-up with your physician for appropriate ongoing care. ? ?

## 2014-04-11 LAB — URINE CULTURE: Colony Count: 45000

## 2014-04-18 ENCOUNTER — Other Ambulatory Visit: Payer: Self-pay | Admitting: Cardiology

## 2014-05-14 ENCOUNTER — Ambulatory Visit (HOSPITAL_COMMUNITY)
Admission: RE | Admit: 2014-05-14 | Discharge: 2014-05-14 | Disposition: A | Discharge status: Home / Self Care | Payer: Medicare Other | Source: Ambulatory Visit | Attending: Internal Medicine | Admitting: Internal Medicine

## 2014-05-14 DIAGNOSIS — Z1231 Encounter for screening mammogram for malignant neoplasm of breast: Secondary | ICD-10-CM | POA: Diagnosis present

## 2014-05-19 ENCOUNTER — Other Ambulatory Visit: Payer: Self-pay | Admitting: Internal Medicine

## 2014-05-19 DIAGNOSIS — R928 Other abnormal and inconclusive findings on diagnostic imaging of breast: Secondary | ICD-10-CM

## 2014-05-26 ENCOUNTER — Ambulatory Visit
Admission: RE | Admit: 2014-05-26 | Discharge: 2014-05-26 | Disposition: A | Discharge status: Home / Self Care | Payer: Medicare Other | Source: Ambulatory Visit | Attending: Internal Medicine | Admitting: Internal Medicine

## 2014-05-26 DIAGNOSIS — R928 Other abnormal and inconclusive findings on diagnostic imaging of breast: Secondary | ICD-10-CM

## 2014-06-02 ENCOUNTER — Other Ambulatory Visit: Payer: Medicare Other

## 2014-06-19 ENCOUNTER — Other Ambulatory Visit: Payer: Self-pay | Admitting: Cardiology

## 2014-08-17 IMAGING — CR DG RIBS 2V*R*
3 series · 3 of 3 positions shown · non-contrast
Comparison: Chest x-ray of 04/19/2011

CLINICAL DATA: Fell several days ago with pain in the right lower
anterior ribs, former smoking history

RIGHT RIBS - 2 VIEW

[w ribs ap/pa lower right]
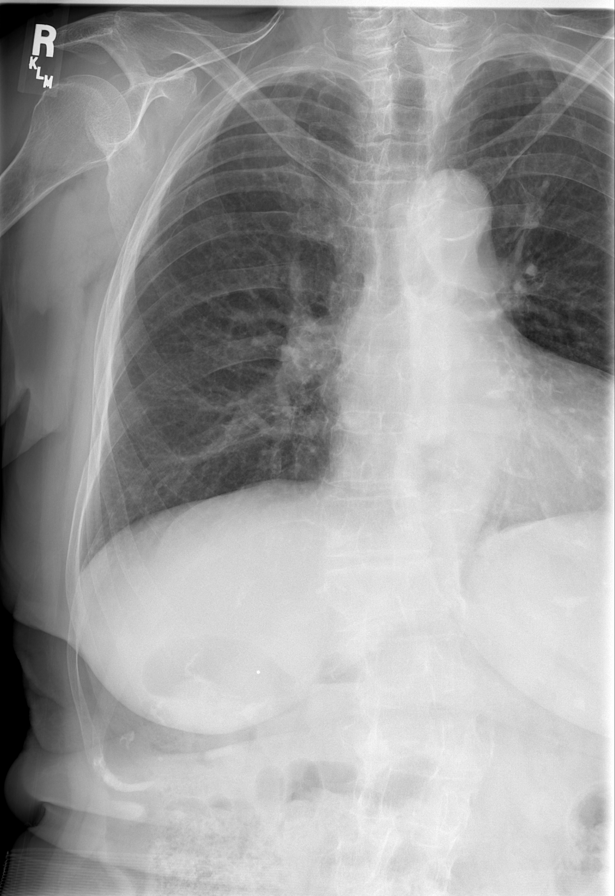

[w ribs oblique right]
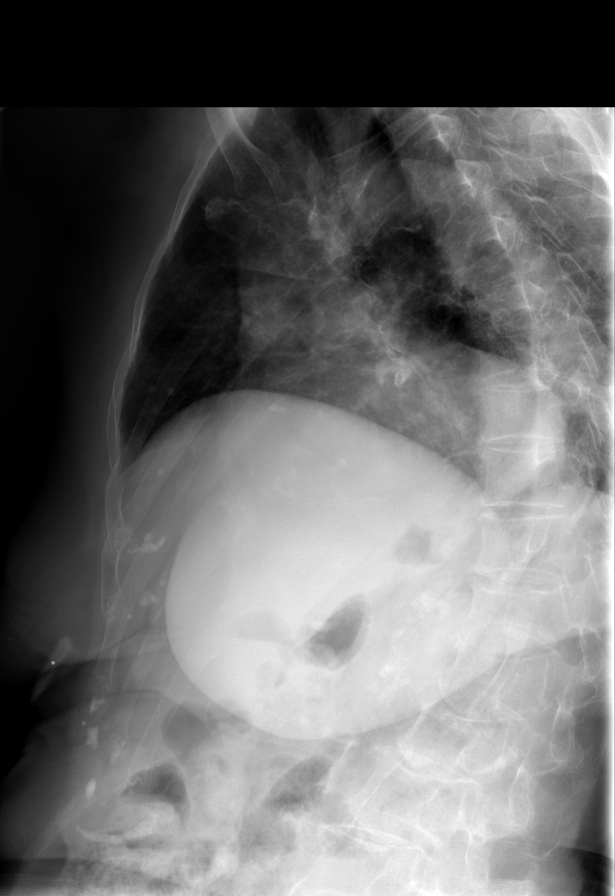

[t ribs ap/pa  lower right]
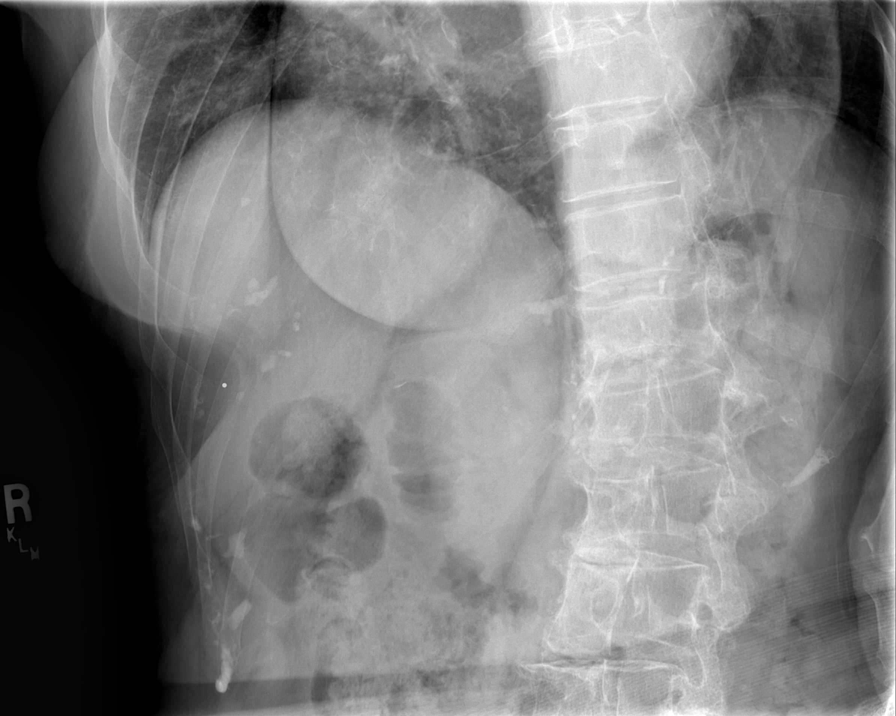

[3 of 3 positions shown; findings below may reference images not displayed]

FINDINGS: Right anterior rib films show very slight cortical
irregularity of the anterior right seventh rib, and a nondisplaced
fracture cannot be excluded.  No other right rib abnormality is
seen.
IMPRESSION: Cannot exclude nondisplaced fracture of the right anterolateral
seventh rib.

## 2014-08-19 ENCOUNTER — Encounter: Payer: Self-pay | Admitting: Nurse Practitioner

## 2014-08-19 ENCOUNTER — Ambulatory Visit (INDEPENDENT_AMBULATORY_CARE_PROVIDER_SITE_OTHER): Payer: Medicare Other | Admitting: Nurse Practitioner

## 2014-08-19 ENCOUNTER — Telehealth: Payer: Self-pay | Admitting: *Deleted

## 2014-08-19 VITALS — BP 130/66 | HR 85 | Ht 66.0 in | Wt 146.8 lb

## 2014-08-19 DIAGNOSIS — I255 Ischemic cardiomyopathy: Secondary | ICD-10-CM

## 2014-08-19 DIAGNOSIS — E038 Other specified hypothyroidism: Secondary | ICD-10-CM

## 2014-08-19 DIAGNOSIS — E785 Hyperlipidemia, unspecified: Secondary | ICD-10-CM

## 2014-08-19 LAB — HEPATIC FUNCTION PANEL
ALT: 13 U/L (ref 0–35)
AST: 19 U/L (ref 0–37)
Albumin: 4.3 g/dL (ref 3.5–5.2)
Alkaline Phosphatase: 59 U/L (ref 39–117)
Bilirubin, Direct: 0.3 mg/dL (ref 0.0–0.3)
Total Bilirubin: 1.8 mg/dL — ABNORMAL HIGH (ref 0.2–1.2)
Total Protein: 6.5 g/dL (ref 6.0–8.3)

## 2014-08-19 LAB — BASIC METABOLIC PANEL
BUN: 26 mg/dL — ABNORMAL HIGH (ref 6–23)
CO2: 29 mEq/L (ref 19–32)
Calcium: 9.4 mg/dL (ref 8.4–10.5)
Chloride: 107 mEq/L (ref 96–112)
Creatinine, Ser: 1.04 mg/dL (ref 0.40–1.20)
GFR: 52.22 mL/min — ABNORMAL LOW (ref >60.00)
Glucose, Bld: 84 mg/dL (ref 70–99)
Potassium: 3.8 mEq/L (ref 3.5–5.1)
Sodium: 142 mEq/L (ref 135–145)

## 2014-08-19 LAB — CBC
HCT: 38 % (ref 36.0–46.0)
Hemoglobin: 13 g/dL (ref 12.0–15.0)
MCHC: 34.1 g/dL (ref 30.0–36.0)
MCV: 88.8 fl (ref 78.0–100.0)
Platelets: 155 10*3/uL (ref 150.0–400.0)
RBC: 4.28 Mil/uL (ref 3.87–5.11)
RDW: 14.6 % (ref 11.5–15.5)
WBC: 6.3 10*3/uL (ref 4.0–10.5)

## 2014-08-19 LAB — LIPID PANEL
Cholesterol: 151 mg/dL (ref 0–200)
HDL: 53.8 mg/dL (ref >39.00)
LDL Cholesterol: 70 mg/dL (ref 0–99)
NonHDL: 97.2
Total CHOL/HDL Ratio: 3
Triglycerides: 134 mg/dL (ref 0.0–149.0)
VLDL: 26.8 mg/dL (ref 0.0–40.0)

## 2014-08-19 LAB — TSH: TSH: 1.97 u[IU]/mL (ref 0.35–4.50)

## 2014-08-19 NOTE — Patient Instructions (Addendum)
We will be checking the following labs today BMET, CBC, TSH, Lipids & HPF  Stay on your current medicines  Stay as active as you can  I will see you in 6 months  Call the [facility] office at [phone] if you have any questions, problems or concerns.

## 2014-08-19 NOTE — Telephone Encounter (Signed)
Pt wanted to let Norma FredricksonLori Gerhardt, NP, to know pt only took synthroid this am.

## 2014-08-19 NOTE — Progress Notes (Signed)
CARDIOLOGY OFFICE NOTE  Date:  08/19/2014    Tanya KinsWilma L Duarte Date of Birth: 1917/03/15 Medical Record #[id number]  PCP:  Lorenda PeckOBERTS, RONALD WAYNE, MD  Cardiologist:  [facility]    Chief Complaint  Patient presents with  . Cardiomyopathy    6 month check - seen for Dr. Antoine PocheHochrein     History of Present Illness: Tanya KinsWilma L Majano is a 81 y.o. female who presents today for a 6 month check. Seen for Dr. Antoine PocheHochrein. She is now 81 years of age. She has an ischemic CM and is managed conservatively. EF is 25 to 30% per echo back in March of 2012. Other issues include prior stroke in December of 2012. She was previously on chronic Plavix. She has a chronic LBBB, hypothyroidism and HLD.   Last seen here in August - was doing well. Off several of her medicines that included ACE and Plavix.  Comes in today. Here alone. She seems to be holding her own. No chest pain. Breathing is ok. She fell out of the bed about a month ago - no serious injury and now says she is sleeping in the middle of the bed. Admits that she continues to slow down. Has plans to go to [facility] this [place] again. She is tolerating her medicines. Does not feel like she has missed any - uses a pill box. Still driving short distances. Denies having any recent labs. Sees PCP in August of this year.  Past Medical History  Diagnosis Date  . Ischemic cardiomyopathy June 2009    managed medically; EF is 20 to 25%  . 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 Hypothyroidism   . Hyperlipemia   . Advanced age   . Grief     from husband's death  . Left bundle branch block   . Cardiac arrest 2007    Occurred during planned ovarian surgery that resolved spontaneously after her stomach was deflated.   . Stroke   . Left heart failure     EF is 20 to 25%; She is managed conservatively  . CVA (cerebral infarction) Oct 2010    "light stroke". Plavix started.    Past Surgical History  Procedure Laterality Date  . Dilation  and curettage of uterus    . Cataract extraction  09/18/2012    right eye     Medications: Current Outpatient Prescriptions  Medication Sig Dispense Refill  . aspirin EC 81 MG tablet Take 81 mg by mouth daily.    Marland Kitchen. atorvastatin (LIPITOR) 10 MG tablet Take 10 mg by mouth daily.    Marland Kitchen. DIGITEK 125 MCG tablet TAKE 1 TABLET (0.125 MG TOTAL) BY MOUTH DAILY. 90 tablet 2  . furosemide (LASIX) 20 MG tablet Take 20 mg by mouth daily.    Marland Kitchen. KLOR-CON M10 10 MEQ tablet TAKE 1 TABLET (10 MEQ TOTAL) BY MOUTH DAILY. 30 tablet 1  . levothyroxine (SYNTHROID, LEVOTHROID) 75 MCG tablet Take 75 mcg by mouth daily.    . nitroGLYCERIN (NITRODUR - DOSED IN MG/24 HR) 0.2 mg/hr patch Place 0.2 mg onto the skin 2 (two) times daily.     . nitroGLYCERIN (NITROSTAT) 0.4 MG SL tablet Place 0.4 mg under the tongue every 5 (five) minutes as needed for chest pain.     No current facility-administered medications for this visit.    Allergies: Allergies  Allergen Reactions  . Procaine Hcl Hypertension    PASSED OUT    Social History: The patient  reports that  she quit smoking about 46 years ago. She has never used smokeless tobacco. She reports that she does not drink alcohol or use illicit drugs.   Family History: The patient's family history includes Aneurysm in her father; Heart attack in her mother.   Review of Systems: Please see the history of present illness.      All other systems are reviewed and negative.   Physical Exam: VS:  BP 130/66 mmHg  Pulse 85  Ht  (1.676 m)  Wt 146 lb 12.8 oz (66.588 kg)  BMI 23.71 kg/m2  SpO2 96% .  BMI Body mass index is 23.71 kg/(m^2).  Wt Readings from Last 3 Encounters:  08/19/14 146 lb 12.8 oz (66.588 kg)  04/10/14 141 lb (63.957 kg)  03/03/14 146 lb 12.8 oz (66.588 kg)    General: Pleasant. Looks younger than her stated age and in no acute distress.  HEENT: Normal. Neck: Supple, no JVD, carotid bruits, or masses noted.  Cardiac: Regular rate and rhythm.  No murmurs, rubs, or gallops. No edema.  Respiratory:  Lungs are coarse but with normal work of breathing.  GI: Soft and nontender.  MS: No deformity or atrophy. Gait and ROM intact. Using a cane.  Skin: Warm and dry. Color is normal.  Neuro:  Strength and sensation are intact and no gross focal deficits noted.  Psych: Alert, appropriate and with normal affect.   LABORATORY DATA:  EKG:  EKG is not ordered today.  Lab Results  Component Value Date   WBC 6.9 04/10/2014   HGB 11.9* 04/10/2014   HCT 36.5 04/10/2014   PLT 127* 04/10/2014   GLUCOSE 111* 04/10/2014   CHOL 157 08/27/2012   TRIG 158.0* 08/27/2012   HDL 56.00 08/27/2012   LDLCALC 69 08/27/2012   ALT 12 04/10/2014   AST 21 04/10/2014   NA 143 04/10/2014   K 4.1 04/10/2014   CL 106 04/10/2014   CREATININE 1.05 04/10/2014   BUN 28* 04/10/2014   CO2 26 04/10/2014   TSH 0.70 08/27/2012   INR 1.04 04/19/2011   HGBA1C  12/20/2007    5.7 (NOTE)   The ADA recommends the following therapeutic goals for glycemic   control related to Hgb A1C measurement:   Goal of Therapy:   < 7.0% Hgb A1C   Action Suggested:  > 8.0% Hgb A1C   Ref:  Diabetes Care, 22, Suppl. 1, 1999    BNP (last 3 results) No results for input(s): BNP in the last 8760 hours.  ProBNP (last 3 results) No results for input(s): PROBNP in the last 8760 hours.   Other Studies Reviewed Today: N/A  Assessment/Plan:  1. Ischemic CM - EF is 25 to 30% - managed conservatively - she continues to do very well and is asymptomatic. I have left her on her current regimen.   2. HLD - on low dose statin therapy - she says she has had her labs checked recently with PCP  3. Advanced age - does seem to be slowing down some.  4. Chronic LBBB   5. HTN - blood pressure looks ok. No change in her current regimen.   6. Falls - no serious injury  7. Prior stroke - just on aspirin therapy   Current medicines are reviewed with the patient today.  The patient does not  have concerns regarding medicines other than what has been noted above.  The following changes have been made:  See above.  Labs/ tests ordered today include:  Orders Placed This Encounter  Procedures  . Basic metabolic panel  . CBC  . Hepatic function panel  . Lipid panel  . TSH     Disposition:   FU with me in 6 months.   Patient is agreeable to this plan and will call if any problems develop in the interim.   Signed: Rosalio Macadamia, RN, ANP-C 08/19/2014 11:23 AM  [facility] [address] [place], [place]  [address] Phone: [phone] Fax: [phone]

## 2014-08-22 ENCOUNTER — Other Ambulatory Visit: Payer: Self-pay | Admitting: Cardiology

## 2014-10-06 ENCOUNTER — Other Ambulatory Visit: Payer: Self-pay | Admitting: Cardiology

## 2014-10-06 NOTE — Telephone Encounter (Signed)
Rx has been sent to the pharmacy electronically. ° °

## 2014-10-21 ENCOUNTER — Other Ambulatory Visit: Payer: Self-pay | Admitting: Adult Health

## 2014-11-10 ENCOUNTER — Emergency Department (HOSPITAL_COMMUNITY): Payer: Medicare Other

## 2014-11-10 ENCOUNTER — Emergency Department (HOSPITAL_COMMUNITY)
Admission: EM | Admit: 2014-11-10 | Discharge: 2014-11-10 | Disposition: A | Discharge status: Home / Self Care | Payer: Medicare Other | Attending: Emergency Medicine | Admitting: Emergency Medicine

## 2014-11-10 ENCOUNTER — Encounter (HOSPITAL_COMMUNITY): Payer: Self-pay | Admitting: Emergency Medicine

## 2014-11-10 DIAGNOSIS — E785 Hyperlipidemia, unspecified: Secondary | ICD-10-CM | POA: Insufficient documentation

## 2014-11-10 DIAGNOSIS — R0602 Shortness of breath: Secondary | ICD-10-CM | POA: Diagnosis present

## 2014-11-10 DIAGNOSIS — Z8674 Personal history of sudden cardiac arrest: Secondary | ICD-10-CM | POA: Diagnosis not present

## 2014-11-10 DIAGNOSIS — Z7982 Long term (current) use of aspirin: Secondary | ICD-10-CM | POA: Insufficient documentation

## 2014-11-10 DIAGNOSIS — Z79899 Other long term (current) drug therapy: Secondary | ICD-10-CM | POA: Diagnosis not present

## 2014-11-10 DIAGNOSIS — I509 Heart failure, unspecified: Secondary | ICD-10-CM | POA: Diagnosis not present

## 2014-11-10 DIAGNOSIS — F419 Anxiety disorder, unspecified: Secondary | ICD-10-CM | POA: Diagnosis not present

## 2014-11-10 DIAGNOSIS — Z8673 Personal history of transient ischemic attack (TIA), and cerebral infarction without residual deficits: Secondary | ICD-10-CM | POA: Diagnosis not present

## 2014-11-10 DIAGNOSIS — I252 Old myocardial infarction: Secondary | ICD-10-CM | POA: Insufficient documentation

## 2014-11-10 DIAGNOSIS — E039 Hypothyroidism, unspecified: Secondary | ICD-10-CM | POA: Insufficient documentation

## 2014-11-10 DIAGNOSIS — R0609 Other forms of dyspnea: Secondary | ICD-10-CM | POA: Diagnosis not present

## 2014-11-10 DIAGNOSIS — Z87891 Personal history of nicotine dependence: Secondary | ICD-10-CM | POA: Insufficient documentation

## 2014-11-10 DIAGNOSIS — R0600 Dyspnea, unspecified: Secondary | ICD-10-CM

## 2014-11-10 LAB — PROTIME-INR
INR: 0.99 (ref 0.00–1.49)
Prothrombin Time: 13.2 seconds (ref 11.6–15.2)

## 2014-11-10 LAB — BASIC METABOLIC PANEL
Anion gap: 10 (ref 5–15)
BUN: 27 mg/dL — ABNORMAL HIGH (ref 6–20)
CALCIUM: 9.3 mg/dL (ref 8.9–10.3)
CHLORIDE: 107 mmol/L (ref 101–111)
CO2: 25 mmol/L (ref 22–32)
CREATININE: 1.23 mg/dL — AB (ref 0.44–1.00)
GFR calc Af Amer: 42 mL/min — ABNORMAL LOW (ref >60)
GFR calc non Af Amer: 36 mL/min — ABNORMAL LOW (ref >60)
GLUCOSE: 92 mg/dL (ref 70–99)
Potassium: 3.9 mmol/L (ref 3.5–5.1)
Sodium: 142 mmol/L (ref 135–145)

## 2014-11-10 LAB — BRAIN NATRIURETIC PEPTIDE: B NATRIURETIC PEPTIDE 5: 45.5 pg/mL (ref 0.0–100.0)

## 2014-11-10 LAB — CBC
HCT: 39.2 % (ref 36.0–46.0)
HEMOGLOBIN: 13 g/dL (ref 12.0–15.0)
MCH: 30.6 pg (ref 26.0–34.0)
MCHC: 33.2 g/dL (ref 30.0–36.0)
MCV: 92.2 fL (ref 78.0–100.0)
Platelets: 132 10*3/uL — ABNORMAL LOW (ref 150–400)
RBC: 4.25 MIL/uL (ref 3.87–5.11)
RDW: 13.8 % (ref 11.5–15.5)
WBC: 7 10*3/uL (ref 4.0–10.5)

## 2014-11-10 LAB — I-STAT TROPONIN, ED
TROPONIN I, POC: 0.02 ng/mL (ref 0.00–0.08)
Troponin i, poc: 0.02 ng/mL (ref 0.00–0.08)

## 2014-11-10 NOTE — ED Notes (Signed)
Per EMS: Sudden onset shortness of breath woke pt up from her sleep, states it just didn't feel right and she has never felt like that before.  Denies chest pain.  Breath sounds clear, diminished at bases.  Hx of chf and MI, 12 lead shows LBBB.  NSR rate of 80, VSS.  92% RA.  324 ASA given en route, 18 gauge left AC.

## 2014-11-10 NOTE — ED Notes (Signed)
Pt belching a lot

## 2014-11-10 NOTE — Discharge Instructions (Signed)

## 2014-11-10 NOTE — ED Provider Notes (Signed)
CSN: [id number]     Arrival date & time 11/10/14  0041 History  This chart was scribed for Loren Raceravid Neeley Sedivy, MD by Abel PrestoKara Demonbreun, ED Scribe. This patient was seen in room A05C/A05C and the patient's care was started at 1:10 AM.    Chief Complaint  Patient presents with  . Shortness of Breath     Patient is a 81 y.o. female presenting with shortness of breath. The history is provided by the patient. No language interpreter was used.  Shortness of Breath Associated symptoms: no abdominal pain, no chest pain, no cough, no fever, no headaches, no neck pain, no rash, no vomiting and no wheezing    HPI Comments: [name] is a 81 y.o. female brought in by ambulance, who presents to the Emergency Department complaining of SOB with onset 30 min PTA. She states she was sleeping at onset. Pt reports increased burping. She states she has noticed swelling in her ankles lately. Pt denies chest pain, cough, fever, and chills.   Past Medical History  Diagnosis Date  . Ischemic cardiomyopathy June 2009    managed medically; EF is 20 to 25%  . 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 Hypothyroidism   . Hyperlipemia   . Advanced age   . Grief     from husband's death  . Left bundle branch block   . Cardiac arrest 2007    Occurred during planned ovarian surgery that resolved spontaneously after her stomach was deflated.   . Stroke   . Left heart failure     EF is 20 to 25%; She is managed conservatively  . CVA (cerebral infarction) Oct 2010    "light stroke". Plavix started.   Past Surgical History  Procedure Laterality Date  . Dilation and curettage of uterus    . Cataract extraction  09/18/2012    right eye   Family History  Problem Relation Age of Onset  . Aneurysm Father   . Heart attack Mother    History  Substance Use Topics  . Smoking status: Former Smoker    Quit date: 07/11/1968  . Smokeless tobacco: Never Used  . Alcohol Use: No   OB History    No data  available     Review of Systems  Constitutional: Negative for fever and chills.  Respiratory: Positive for shortness of breath. Negative for cough and wheezing.   Cardiovascular: Negative for chest pain and leg swelling.  Gastrointestinal: Negative for nausea, vomiting and abdominal pain.  Musculoskeletal: Positive for joint swelling. Negative for back pain, neck pain and neck stiffness.  Skin: Negative for rash and wound.  Neurological: Negative for dizziness, weakness, light-headedness, numbness and headaches.  All other systems reviewed and are negative.     Allergies  Procaine hcl  Home Medications   Prior to Admission medications   Medication Sig Start Date End Date Taking? Authorizing Provider  aspirin EC 81 MG tablet Take 81 mg by mouth daily.   Yes Historical Provider, MD  atorvastatin (LIPITOR) 10 MG tablet Take 10 mg by mouth daily.   Yes Historical Provider, MD  DIGITEK 125 MCG tablet TAKE 1 TABLET (0.125 MG TOTAL) BY MOUTH DAILY. 10/06/14  Yes Rollene RotundaJames Hochrein, MD  furosemide (LASIX) 20 MG tablet Take 20 mg by mouth daily.   Yes Historical Provider, MD  levothyroxine (SYNTHROID, LEVOTHROID) 75 MCG tablet Take 75 mcg by mouth daily.   Yes Historical Provider, MD  nitroGLYCERIN (NITROSTAT) 0.4 MG SL tablet Place 0.4 mg under  the tongue every 5 (five) minutes as needed for chest pain.   Yes Historical Provider, MD  potassium chloride (K-DUR) 10 MEQ tablet TAKE 1 TABLET BY MOUTH DAILY 10/21/14  Yes Rollene Rotunda, MD  nitroGLYCERIN (NITRODUR - DOSED IN MG/24 HR) 0.2 mg/hr patch Place 0.2 mg onto the skin 2 (two) times daily.  08/11/14   Historical Provider, MD  potassium chloride (K-DUR,KLOR-CON) 10 MEQ tablet TAKE 1 TABLET (10 MEQ TOTAL) BY MOUTH DAILY. Patient not taking: Reported on 11/10/2014 08/25/14   Jodelle Gross, NP   BP 129/80 mmHg  Pulse 65  Temp(Src) 98.4 F (36.9 C) (Oral)  Resp 19  Ht  (1.676 m)  Wt 142 lb (64.411 kg)  BMI 22.93 kg/m2  SpO2  97% Physical Exam  Constitutional: She is oriented to person, place, and time. She appears well-developed and well-nourished. No distress.  Mildly anxious  HENT:  Head: Normocephalic and atraumatic.  Mouth/Throat: Oropharynx is clear and moist.  Eyes: Conjunctivae and EOM are normal. Pupils are equal, round, and reactive to light.  Neck: Normal range of motion. Neck supple.  Cardiovascular: Normal rate and regular rhythm.   Pulmonary/Chest: Effort normal and breath sounds normal. No respiratory distress. She has no wheezes. She has no rales. She exhibits no tenderness.  Abdominal: Soft. Bowel sounds are normal. She exhibits no distension and no mass. There is no tenderness. There is no rebound and no guarding.  Musculoskeletal: Normal range of motion. She exhibits no edema or tenderness.  No calf swelling or tenderness. No pitting edema. Distal pulses intact.  Neurological: She is alert and oriented to person, place, and time.  Moves all extremities without deficit. Sensation is grossly intact.  Skin: Skin is warm and dry. No rash noted. No erythema.  Psychiatric: She has a normal mood and affect. Her behavior is normal.  Nursing note and vitals reviewed.   ED Course  Procedures (including critical care time) DIAGNOSTIC STUDIES: Oxygen Saturation is 93% on room air, normal by my interpretation.    COORDINATION OF CARE: 1:12 AM Discussed treatment plan with patient at beside, the patient agrees with the plan and has no further questions at this time.   Labs Review Labs Reviewed  CBC - Abnormal; Notable for the following:    Platelets 132 (*)    All other components within normal limits  BASIC METABOLIC PANEL - Abnormal; Notable for the following:    BUN 27 (*)    Creatinine, Ser 1.23 (*)    GFR calc non Af Amer 36 (*)    GFR calc Af Amer 42 (*)    All other components within normal limits  BRAIN NATRIURETIC PEPTIDE  PROTIME-INR  I-STAT TROPOININ, ED  I-STAT TROPOININ, ED     Imaging Review Dg Chest Port 1 View  11/10/2014   CLINICAL DATA:  Dyspnea, onset tonight  EXAM: PORTABLE CHEST - 1 VIEW  COMPARISON:  04/10/2014  FINDINGS: There is moderate unchanged cardiomegaly. The lungs are clear. There are no large effusions.  IMPRESSION: Cardiomegaly.  No acute findings.   Electronically Signed   By: Ellery Plunk M.D.   On: 11/10/2014 01:33     EKG Interpretation   Date/Time:  Monday Nov 10 2014 01:09:35 EDT Ventricular Rate:  79 PR Interval:  241 QRS Duration: 172 QT Interval:  414 QTC Calculation: 475 R Axis:   -76 Text Interpretation:  Sinus rhythm Prolonged PR interval Left bundle  branch block Confirmed by Ranae Palms  MD, Rien Marland ([id number]) on  11/10/2014 5:27:32  AM      MDM   Final diagnoses:  None   I personally performed the services described in this documentation, which was scribed in my presence. The recorded information has been reviewed and is accurate.  Patient is asymptomatic in the emergency department. Vital signs remained stable. Initial troponin is normal. Low suspicion for PE. We'll get a repeat troponin but anticipate discharge for paroxysmal dyspnea  EKG with left bundle branch block was unchanged from previous. Troponin 2 is negative. Patient is asymptomatic. Understands need to follow-up with her primary physician and return precautions have been given.  Loren Racer, MD 11/10/14 [phone]

## 2014-12-23 ENCOUNTER — Encounter: Payer: Self-pay | Admitting: *Deleted

## 2014-12-24 ENCOUNTER — Other Ambulatory Visit: Payer: Self-pay | Admitting: Internal Medicine

## 2014-12-24 ENCOUNTER — Ambulatory Visit
Admission: RE | Admit: 2014-12-24 | Discharge: 2014-12-24 | Disposition: A | Discharge status: Home / Self Care | Payer: Medicare Other | Source: Ambulatory Visit | Attending: Internal Medicine | Admitting: Internal Medicine

## 2014-12-24 DIAGNOSIS — M79672 Pain in left foot: Secondary | ICD-10-CM

## 2015-01-27 ENCOUNTER — Other Ambulatory Visit: Payer: Self-pay | Admitting: Cardiology

## 2015-02-07 ENCOUNTER — Emergency Department (HOSPITAL_COMMUNITY)
Admission: EM | Admit: 2015-02-07 | Discharge: 2015-02-07 | Disposition: A | Discharge status: Home / Self Care | Payer: Medicare Other | Attending: Emergency Medicine | Admitting: Emergency Medicine

## 2015-02-07 ENCOUNTER — Emergency Department (HOSPITAL_COMMUNITY): Payer: Medicare Other

## 2015-02-07 ENCOUNTER — Encounter (HOSPITAL_COMMUNITY): Payer: Self-pay | Admitting: *Deleted

## 2015-02-07 DIAGNOSIS — S199XXA Unspecified injury of neck, initial encounter: Secondary | ICD-10-CM | POA: Diagnosis not present

## 2015-02-07 DIAGNOSIS — S0001XA Abrasion of scalp, initial encounter: Secondary | ICD-10-CM | POA: Insufficient documentation

## 2015-02-07 DIAGNOSIS — W101XXA Fall (on)(from) sidewalk curb, initial encounter: Secondary | ICD-10-CM | POA: Diagnosis not present

## 2015-02-07 DIAGNOSIS — Z7982 Long term (current) use of aspirin: Secondary | ICD-10-CM | POA: Diagnosis not present

## 2015-02-07 DIAGNOSIS — S0990XA Unspecified injury of head, initial encounter: Secondary | ICD-10-CM | POA: Diagnosis present

## 2015-02-07 DIAGNOSIS — I252 Old myocardial infarction: Secondary | ICD-10-CM | POA: Insufficient documentation

## 2015-02-07 DIAGNOSIS — E039 Hypothyroidism, unspecified: Secondary | ICD-10-CM | POA: Insufficient documentation

## 2015-02-07 DIAGNOSIS — Y9389 Activity, other specified: Secondary | ICD-10-CM | POA: Insufficient documentation

## 2015-02-07 DIAGNOSIS — E785 Hyperlipidemia, unspecified: Secondary | ICD-10-CM | POA: Diagnosis not present

## 2015-02-07 DIAGNOSIS — Y998 Other external cause status: Secondary | ICD-10-CM | POA: Diagnosis not present

## 2015-02-07 DIAGNOSIS — Z8673 Personal history of transient ischemic attack (TIA), and cerebral infarction without residual deficits: Secondary | ICD-10-CM | POA: Diagnosis not present

## 2015-02-07 DIAGNOSIS — Z87891 Personal history of nicotine dependence: Secondary | ICD-10-CM | POA: Insufficient documentation

## 2015-02-07 DIAGNOSIS — Y92480 Sidewalk as the place of occurrence of the external cause: Secondary | ICD-10-CM | POA: Diagnosis not present

## 2015-02-07 DIAGNOSIS — Z79899 Other long term (current) drug therapy: Secondary | ICD-10-CM | POA: Diagnosis not present

## 2015-02-07 DIAGNOSIS — I509 Heart failure, unspecified: Secondary | ICD-10-CM | POA: Insufficient documentation

## 2015-02-07 DIAGNOSIS — Z8659 Personal history of other mental and behavioral disorders: Secondary | ICD-10-CM | POA: Diagnosis not present

## 2015-02-07 DIAGNOSIS — W19XXXA Unspecified fall, initial encounter: Secondary | ICD-10-CM

## 2015-02-07 NOTE — ED Provider Notes (Signed)
CSN: [id number]     Arrival date & time 02/07/15  1323 History   First MD Initiated Contact with Patient 02/07/15 1325     Chief Complaint  Patient presents with  . Fall     (Consider location/radiation/quality/duration/timing/severity/associated sxs/prior Treatment) HPI Comments: Patient slipped while walking out of [place]. Slipped on the sidewalk. No preceding dizziness or chest pain. Hit her head, no loss of consciousness.  Patient is a 81 y.o. female presenting with fall. The history is provided by the patient.  Fall This is a new problem. The current episode started 1 to 2 hours ago. Episode frequency: once. The problem has not changed since onset.Pertinent negatives include no chest pain and no shortness of breath. Nothing aggravates the symptoms. Nothing relieves the symptoms.    Past Medical History  Diagnosis Date  . Ischemic cardiomyopathy June 2009    managed medically; EF is 20 to 25%  . 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 Hypothyroidism   . Hyperlipemia   . Advanced age   . Grief     from husband's death  . Left bundle branch block   . Cardiac arrest 2007    Occurred during planned ovarian surgery that resolved spontaneously after her stomach was deflated.   . Stroke   . Left heart failure     EF is 20 to 25%; She is managed conservatively  . CVA (cerebral infarction) Oct 2010    "light stroke". Plavix started.   Past Surgical History  Procedure Laterality Date  . Dilation and curettage of uterus    . Cataract extraction  09/18/2012    right eye   Family History  Problem Relation Age of Onset  . Aneurysm Father   . Heart attack Mother    History  Substance Use Topics  . Smoking status: Former Smoker    Quit date: 07/11/1968  . Smokeless tobacco: Never Used  . Alcohol Use: No   OB History    No data available     Review of Systems  Constitutional: Negative for fever.  Respiratory: Negative for cough and shortness of breath.     Cardiovascular: Negative for chest pain.  Gastrointestinal: Negative for vomiting.  All other systems reviewed and are negative.     Allergies  Procaine hcl  Home Medications   Prior to Admission medications   Medication Sig Start Date End Date Taking? Authorizing Provider  aspirin EC 81 MG tablet Take 81 mg by mouth daily.    Historical Provider, MD  atorvastatin (LIPITOR) 10 MG tablet Take 10 mg by mouth daily.    Historical Provider, MD  DIGITEK 125 MCG tablet TAKE 1 TABLET (0.125 MG TOTAL) BY MOUTH DAILY. 10/06/14   Rollene Rotunda, MD  furosemide (LASIX) 20 MG tablet Take 20 mg by mouth daily.    Historical Provider, MD  levothyroxine (SYNTHROID, LEVOTHROID) 75 MCG tablet Take 75 mcg by mouth daily.    Historical Provider, MD  nitroGLYCERIN (NITRODUR - DOSED IN MG/24 HR) 0.2 mg/hr patch PLACE ONE PATCH ONTO SKIN ONCE DAILY AT BEDTIME 01/27/15   Rollene Rotunda, MD  nitroGLYCERIN (NITROSTAT) 0.4 MG SL tablet Place 0.4 mg under the tongue every 5 (five) minutes as needed for chest pain.    Historical Provider, MD  potassium chloride (K-DUR) 10 MEQ tablet TAKE 1 TABLET BY MOUTH DAILY 10/21/14   Rollene Rotunda, MD  potassium chloride (K-DUR,KLOR-CON) 10 MEQ tablet TAKE 1 TABLET (10 MEQ TOTAL) BY MOUTH DAILY. Patient not taking: Reported on  11/10/2014 08/25/14   Jodelle Gross, NP   BP 136/69 mmHg  Pulse 73  Temp(Src) 97.8 F (36.6 C) (Oral)  Resp 18  SpO2 95% Physical Exam  Constitutional: She is oriented to person, place, and time. She appears well-developed and well-nourished. No distress.  HENT:  Head: Normocephalic.    Mouth/Throat: Oropharynx is clear and moist.  Eyes: EOM are normal. Pupils are equal, round, and reactive to light.  Neck: Normal range of motion. Neck supple.  Cardiovascular: Normal rate and regular rhythm.  Exam reveals no friction rub.   No murmur heard. Pulmonary/Chest: Effort normal and breath sounds normal. No respiratory distress. She has no  wheezes. She has no rales.  Abdominal: Soft. She exhibits no distension. There is no tenderness. There is no rebound.  Musculoskeletal: Normal range of motion. She exhibits no edema.       Cervical back: She exhibits bony tenderness (upper c-spine).  Neurological: She is alert and oriented to person, place, and time.  Skin: No rash noted. She is not diaphoretic.  Nursing note and vitals reviewed.   ED Course  Procedures (including critical care time) Labs Review Labs Reviewed - No data to display  Imaging Review No results found.   EKG Interpretation None      MDM   Final diagnoses:  Fall, initial encounter    58F here with a head injury after a mechanical fall outside of [place]. No preceding symptoms. Small abrasion on the back of her head and mild neck tenderness. Will CT. Neurologically intact.  Imaging normal. Stable for discharge.  Elwin Mocha, MD 02/07/15 [phone]

## 2015-02-07 NOTE — Discharge Instructions (Signed)

## 2015-02-07 NOTE — ED Notes (Signed)
Pt in from [facility] via [facility], per report pt tripped fell backwards onto pavement today, pt reports hitting head, denies LOC, L elbow abrasions, moves all extremities,no obvious deformity, ambulatory on scene, A&O x4, pt reported to take Plavix

## 2015-02-07 NOTE — ED Notes (Signed)
Pt. Is going to CT. 

## 2015-02-07 NOTE — ED Notes (Signed)
Pt placed in gown and in bed. Monitored by pulse ox, bp cuff, and 5-lead.

## 2015-02-24 ENCOUNTER — Ambulatory Visit: Payer: Medicare Other | Admitting: Nurse Practitioner

## 2015-03-03 ENCOUNTER — Ambulatory Visit: Payer: Medicare Other | Admitting: Nurse Practitioner

## 2015-03-04 ENCOUNTER — Other Ambulatory Visit: Payer: Self-pay

## 2015-03-04 MED ORDER — POTASSIUM CHLORIDE ER 10 MEQ PO TBCR: 10.0000 meq | EXTENDED_RELEASE_TABLET | Freq: Every day | ORAL | Status: DC

## 2015-03-06 ENCOUNTER — Other Ambulatory Visit: Payer: Self-pay

## 2015-03-06 DIAGNOSIS — Z1231 Encounter for screening mammogram for malignant neoplasm of breast: Secondary | ICD-10-CM

## 2015-03-18 ENCOUNTER — Encounter: Payer: Self-pay | Admitting: *Deleted

## 2015-03-20 ENCOUNTER — Other Ambulatory Visit: Payer: Self-pay

## 2015-03-20 ENCOUNTER — Encounter: Payer: Self-pay | Admitting: Nurse Practitioner

## 2015-03-20 ENCOUNTER — Other Ambulatory Visit: Payer: Self-pay | Admitting: *Deleted

## 2015-03-20 ENCOUNTER — Ambulatory Visit (INDEPENDENT_AMBULATORY_CARE_PROVIDER_SITE_OTHER): Payer: Medicare Other | Admitting: Nurse Practitioner

## 2015-03-20 VITALS — BP 110/66 | HR 83 | Ht 66.0 in | Wt 144.4 lb

## 2015-03-20 DIAGNOSIS — I255 Ischemic cardiomyopathy: Secondary | ICD-10-CM | POA: Diagnosis not present

## 2015-03-20 DIAGNOSIS — E785 Hyperlipidemia, unspecified: Secondary | ICD-10-CM

## 2015-03-20 MED ORDER — ATORVASTATIN CALCIUM 10 MG PO TABS: 10.0000 mg | ORAL_TABLET | Freq: Every day | ORAL | Status: DC

## 2015-03-20 NOTE — Patient Instructions (Addendum)
We will be checking the following labs today - NONE   Medication Instructions:    Continue with your current medicines.     Testing/Procedures To Be Arranged:  N/A  Follow-Up:   See me in 6 months    Other Special Instructions:   N/A  Call the [facility] office at [phone] if you have any questions, problems or concerns.      

## 2015-03-20 NOTE — Progress Notes (Signed)
CARDIOLOGY OFFICE NOTE  Date:  03/20/2015    [name] Date of Birth: Sep 20, 1916 Medical Record #[id number]  PCP:  Tanya Peck, MD  Cardiologist:  [name]    Chief Complaint  Patient presents with  . Cardiomyopathy    Seen for Dr. Antoine Duarte    History of Present Illness: [name] is a 81 y.o. female who presents today for a follow up visit. Seen for Dr. Antoine Duarte.  She has an ischemic CM and is managed conservatively. EF is 25 to 30% per echo back in March of 2012. Other issues include prior stroke in December of 2012. She was previously on chronic Plavix. She has a chronic LBBB, hypothyroidism and HLD.   Last seen here in February - was doing well. Had already been taken off several of her medicines that included ACE and Plavix.  Comes in today. Here alone. Says she is doing well. No shortness of breath. No chest pain. Using a cane - she did fall while in [employer] back in July. Gets pretty testy when talking about this - she did not get hurt. Still driving. She is pretty happy with how she is doing - more upset about family issues. She had her physical back last month with labs and says they were ok.  Past Medical History  Diagnosis Date  . Ischemic cardiomyopathy June 2009    managed medically; EF is 20 to 25%  . 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 Hypothyroidism   . Hyperlipemia   . Advanced age   . Grief     from husband's death  . Left bundle branch block   . Cardiac arrest 2007    Occurred during planned ovarian surgery that resolved spontaneously after her stomach was deflated.   . Stroke   . Left heart failure     EF is 20 to 25%; She is managed conservatively  . CVA (cerebral infarction) Oct 2010    "light stroke". Plavix started.  . Hypertension     Past Surgical History  Procedure Laterality Date  . Dilation and curettage of uterus    . Cataract extraction  09/18/2012    right eye  . Ovarian cyst removal     2007-2008     Medications: Current Outpatient Prescriptions  Medication Sig Dispense Refill  . aspirin EC 81 MG tablet Take 81 mg by mouth daily.    Marland Kitchen atorvastatin (LIPITOR) 10 MG tablet Take 10 mg by mouth daily.    Marland Kitchen DIGITEK 125 MCG tablet TAKE 1 TABLET (0.125 MG TOTAL) BY MOUTH DAILY. 90 tablet 2  . furosemide (LASIX) 20 MG tablet Take 20 mg by mouth daily.    Marland Kitchen levothyroxine (SYNTHROID, LEVOTHROID) 75 MCG tablet Take 75 mcg by mouth daily.    . nitroGLYCERIN (NITRODUR - DOSED IN MG/24 HR) 0.2 mg/hr patch PLACE ONE PATCH ONTO SKIN ONCE DAILY AT BEDTIME 30 patch 4  . nitroGLYCERIN (NITROSTAT) 0.4 MG SL tablet Place 0.4 mg under the tongue every 5 (five) minutes as needed for chest pain.    . potassium chloride (K-DUR) 10 MEQ tablet Take 1 tablet (10 mEq total) by mouth daily. 30 tablet 3   No current facility-administered medications for this visit.    Allergies: Allergies  Allergen Reactions  . Procaine Hcl Hypertension    PASSED OUT    Social History: The patient  reports that she quit smoking about 46 years ago. She has never used smokeless tobacco. She reports  that she does not drink alcohol or use illicit drugs.   Family History: The patient's family history includes Aneurysm in her father; Heart attack in her mother; Heart defect in her mother.   Review of Systems: Please see the history of present illness.   Otherwise, the review of systems is positive for hearing loss and easy bruising.   All other systems are reviewed and negative.   Physical Exam: VS:  BP 110/66 mmHg  Pulse 83  Ht  (1.676 m)  Wt 144 lb 6.4 oz (65.499 kg)  BMI 23.32 kg/m2  SpO2 95% .  BMI Body mass index is 23.32 kg/(m^2).  Wt Readings from Last 3 Encounters:  03/20/15 144 lb 6.4 oz (65.499 kg)  11/10/14 142 lb (64.411 kg)  08/19/14 146 lb 12.8 oz (66.588 kg)    General: Pleasant. Well developed, well nourished and in no acute distress.  HEENT: Normal. Neck: Supple, no JVD, carotid  bruits, or masses noted.  Cardiac: Regular rate and rhythm. She has an S3 today. No edema.  Respiratory:  Lungs are clear to auscultation bilaterally with normal work of breathing.  GI: Soft and nontender.  MS: No deformity or atrophy. Gait and ROM intact. She is using a cane.  Skin: Warm and dry. Color is normal.  Neuro:  Strength and sensation are intact and no gross focal deficits noted.  Psych: Alert, appropriate and with normal affect.   LABORATORY DATA:  EKG:  EKG is not ordered today.   Lab Results  Component Value Date   WBC 7.0 11/10/2014   HGB 13.0 11/10/2014   HCT 39.2 11/10/2014   PLT 132* 11/10/2014   GLUCOSE 92 11/10/2014   CHOL 151 08/19/2014   TRIG 134.0 08/19/2014   HDL 53.80 08/19/2014   LDLCALC 70 08/19/2014   ALT 13 08/19/2014   AST 19 08/19/2014   NA 142 11/10/2014   K 3.9 11/10/2014   CL 107 11/10/2014   CREATININE 1.23* 11/10/2014   BUN 27* 11/10/2014   CO2 25 11/10/2014   TSH 1.97 08/19/2014   INR 0.99 11/10/2014   HGBA1C  12/20/2007    5.7 (NOTE)   The ADA recommends the following therapeutic goals for glycemic   control related to Hgb A1C measurement:   Goal of Therapy:   < 7.0% Hgb A1C   Action Suggested:  > 8.0% Hgb A1C   Ref:  Diabetes Care, 22, Suppl. 1, 1999    BNP (last 3 results)  Recent Labs  11/10/14 0120  BNP 45.5    ProBNP (last 3 results) No results for input(s): PROBNP in the last 8760 hours.   Other Studies Reviewed Today:  Echo Study Conclusions from 2012  - Left ventricle: The cavity size was normal. Wall thickness was normal. Systolic function was severely reduced. The estimated ejection fraction was in the range of 25% to 30%. There is akinesis of the anteroseptal myocardium. There is akinesis of the apical myocardium. There is akinesis of the mid-distal inferior myocardium. Doppler parameters are consistent with abnormal left ventricular relaxation (grade 1 diastolic dysfunction). - Ventricular  septum: Septal motion showed abnormal function and dyssynergy. - Aortic valve: Trivial regurgitation. - Mitral valve: Mild regurgitation. - Left atrium: The atrium was mildly dilated.  Assessment/Plan: 1. Ischemic CM - EF is 25 to 30% - managed conservatively - she continues to do very well and is asymptomatic. She does have an S3 today but no signs of volume overload - I would consider conservative management. I  have left her on her current regimen.   2. HLD - on low dose statin therapy - she says she has had her labs checked recently with PCP  3. Advanced age  19. Chronic LBBB   5. HTN - blood pressure looks ok. No change in her current regimen.   6. Falls - no serious injury  7. Prior stroke - just on aspirin therapy   Current medicines are reviewed with the patient today.  The patient does not have concerns regarding medicines other than what has been noted above.  The following changes have been made:  See above.  Labs/ tests ordered today include:   No orders of the defined types were placed in this encounter.     Disposition:   FU with me in 6 months.   Patient is agreeable to this plan and will call if any problems develop in the interim.   Signed: Rosalio Macadamia, RN, ANP-C 03/20/2015 8:11 AM  [facility] [address] [place], [place]  [address] Phone: [phone] Fax: [phone]

## 2015-03-20 NOTE — Telephone Encounter (Signed)
Rosalio Macadamia, NP at 03/20/2015 7:56 AM  Assessment/Plan: 2. HLD - on low dose statin therapy - she says she has had her labs checked recently with PCP

## 2015-05-18 ENCOUNTER — Ambulatory Visit
Admission: RE | Admit: 2015-05-18 | Discharge: 2015-05-18 | Disposition: A | Discharge status: Home / Self Care | Payer: Medicare Other | Source: Ambulatory Visit

## 2015-05-18 DIAGNOSIS — Z1231 Encounter for screening mammogram for malignant neoplasm of breast: Secondary | ICD-10-CM

## 2015-05-25 ENCOUNTER — Other Ambulatory Visit: Payer: Self-pay | Admitting: Nurse Practitioner

## 2015-06-19 ENCOUNTER — Other Ambulatory Visit: Payer: Self-pay | Admitting: Cardiology

## 2015-07-01 ENCOUNTER — Other Ambulatory Visit: Payer: Self-pay | Admitting: Nurse Practitioner

## 2015-09-16 ENCOUNTER — Ambulatory Visit (INDEPENDENT_AMBULATORY_CARE_PROVIDER_SITE_OTHER): Payer: Medicare Other | Admitting: Nurse Practitioner

## 2015-09-16 ENCOUNTER — Encounter: Payer: Self-pay | Admitting: Nurse Practitioner

## 2015-09-16 VITALS — BP 122/80 | HR 76 | Ht 66.0 in | Wt 140.8 lb

## 2015-09-16 DIAGNOSIS — I255 Ischemic cardiomyopathy: Secondary | ICD-10-CM

## 2015-09-16 DIAGNOSIS — I10 Essential (primary) hypertension: Secondary | ICD-10-CM

## 2015-09-16 DIAGNOSIS — E785 Hyperlipidemia, unspecified: Secondary | ICD-10-CM

## 2015-09-16 DIAGNOSIS — R0600 Dyspnea, unspecified: Secondary | ICD-10-CM | POA: Diagnosis not present

## 2015-09-16 LAB — HEPATIC FUNCTION PANEL
ALT: 14 U/L (ref 6–29)
AST: 21 U/L (ref 10–35)
Albumin: 4.4 g/dL (ref 3.6–5.1)
Alkaline Phosphatase: 59 U/L (ref 33–130)
Bilirubin, Direct: 0.4 mg/dL — ABNORMAL HIGH (ref <=0.2)
Indirect Bilirubin: 2 mg/dL — ABNORMAL HIGH (ref 0.2–1.2)
Total Bilirubin: 2.4 mg/dL — ABNORMAL HIGH (ref 0.2–1.2)
Total Protein: 6.4 g/dL (ref 6.1–8.1)

## 2015-09-16 LAB — BASIC METABOLIC PANEL
BUN: 19 mg/dL (ref 7–25)
CO2: 28 mmol/L (ref 20–31)
Calcium: 9.4 mg/dL (ref 8.6–10.4)
Chloride: 102 mmol/L (ref 98–110)
Creat: 1.09 mg/dL — ABNORMAL HIGH (ref 0.60–0.88)
Glucose, Bld: 95 mg/dL (ref 65–99)
Potassium: 4 mmol/L (ref 3.5–5.3)
Sodium: 140 mmol/L (ref 135–146)

## 2015-09-16 LAB — TSH: TSH: 1.29 mIU/L

## 2015-09-16 LAB — LIPID PANEL
Cholesterol: 171 mg/dL (ref 125–200)
HDL: 56 mg/dL (ref >=46)
LDL Cholesterol: 83 mg/dL (ref <130)
Total CHOL/HDL Ratio: 3.1 Ratio (ref <=5.0)
Triglycerides: 160 mg/dL — ABNORMAL HIGH (ref <150)
VLDL: 32 mg/dL — ABNORMAL HIGH (ref <30)

## 2015-09-16 MED ORDER — FUROSEMIDE 20 MG PO TABS
20.0000 mg | ORAL_TABLET | Freq: Every day | ORAL | Status: DC
Start: 2015-09-16 — End: 2017-05-25

## 2015-09-16 NOTE — Patient Instructions (Addendum)
We will be checking the following labs today - BMET, BNP, dig level, HPF, lipids and TSH   Medication Instructions:    Continue with your current medicines.     Testing/Procedures To Be Arranged:  N/A  Follow-Up:   See me in 6 months.     Other Special Instructions:   N/A    If you need a refill on your cardiac medications before your next appointment, please call your pharmacy.   Call the [facility] office at [phone] if you have any questions, problems or concerns.

## 2015-09-16 NOTE — Progress Notes (Signed)
CARDIOLOGY OFFICE NOTE  Date:  09/16/2015    [name] Date of Birth: 1916-10-30 Medical Record #[id number]  PCP:  Lorenda PeckOBERTS, RONALD WAYNE, MD  Cardiologist:  Hochrein/Ariona Deschene    Chief Complaint  Patient presents with  . Cardiomyopathy  . Congestive Heart Failure    Follow up visit - seen for Dr. Antoine PocheHochrein - former patient of Dr. Ronnald Nianennant's    History of Present Illness: Tanya KinsWilma L Rochelle is a 82 y.o. female who presents today for a follow up visit. Seen for Dr. Antoine PocheHochrein.Former patient of Dr. Ronnald Nianennant's and typically follows with me.  She has an ischemic CM and is managed conservatively. EF is 25 to 30% per echo back in March of 2012. Other issues include prior stroke in December of 2012. She was previously on chronic Plavix. She has a chronic LBBB, hypothyroidism and HLD.   Last seen here in September and was doing ok. [facility] but otherwise doing ok.   Comes in today. Here alone. She says she is doing ok. Breathing is stable. No increase in her swelling but has had some in her hands and actually had to have a ring cut off. Her weight is down 4 pounds. Not dizzy or lightheaded. No chest pain. Tolerating her medicines. Was told by PCP to increase her diuretics - but she did not. She says she is "fine".   Past Medical History  Diagnosis Date  . Ischemic cardiomyopathy June 2009    managed medically; EF is 20 to 25%  . Subendocardial myocardial infarction [facility](HCC) June 2009  . Hypothyroidism   . Hyperlipemia   . Advanced age   . Grief     from husband's death  . Left bundle branch block   . Cardiac arrest [facility](HCC) 2007    Occurred during planned ovarian surgery that resolved spontaneously after her stomach was deflated.   . Stroke (HCC)   . Left heart failure (HCC)     EF is 20 to 25%; She is managed conservatively  . CVA (cerebral infarction) Oct 2010    "light stroke". Plavix started.  . Hypertension     Past Surgical History    Procedure Laterality Date  . Dilation and curettage of uterus    . Cataract extraction  09/18/2012    right eye  . Ovarian cyst removal      2007-2008     Medications: Current Outpatient Prescriptions  Medication Sig Dispense Refill  . aspirin EC 81 MG tablet Take 81 mg by mouth daily.    Marland Kitchen. atorvastatin (LIPITOR) 10 MG tablet TAKE 1 TABLET BY MOUTH DAILY 90 tablet 0  . DIGITEK 125 MCG tablet TAKE 1 TABLET (0.125 MG TOTAL) BY MOUTH DAILY. 90 tablet 2  . levothyroxine (SYNTHROID, LEVOTHROID) 75 MCG tablet Take 75 mcg by mouth daily.    . nitroGLYCERIN (NITRODUR - DOSED IN MG/24 HR) 0.2 mg/hr patch PLACE ONE PATCH ONTO SKIN ONCE DAILY AT BEDTIME 30 patch 4  . nitroGLYCERIN (NITROSTAT) 0.4 MG SL tablet Place 0.4 mg under the tongue every 5 (five) minutes as needed for chest pain.    . potassium chloride (K-DUR) 10 MEQ tablet TAKE 1 TABLET (10 MEQ TOTAL) BY MOUTH DAILY. 30 tablet 9  . furosemide (LASIX) 20 MG tablet Take 1 tablet (20 mg total) by mouth daily. 90 tablet 3   No current facility-administered medications for this visit.    Allergies: Allergies  Allergen Reactions  . Procaine Hcl  Hypertension    PASSED OUT    Social History: The patient  reports that she quit smoking about 47 years ago. She has never used smokeless tobacco. She reports that she does not drink alcohol or use illicit drugs.   Family History: The patient's family history includes Aneurysm in her father; Heart attack in her mother; Heart defect in her mother.   Review of Systems: Please see the history of present illness.   Otherwise, the review of systems is positive for none.   All other systems are reviewed and negative.   Physical Exam: VS:  BP 122/80 mmHg  Pulse 76  Ht  (1.676 m)  Wt 140 lb 12.8 oz (63.866 kg)  BMI 22.74 kg/m2 .  BMI Body mass index is 22.74 kg/(m^2).  Wt Readings from Last 3 Encounters:  09/16/15 140 lb 12.8 oz (63.866 kg)  03/20/15 144 lb 6.4 oz (65.499 kg)  11/10/14  142 lb (64.411 kg)    General: Pleasant. Elderly but alert and in no acute distress. Her weight is down 4 pounds since last visit.  HEENT: Normal. Neck: Supple, no JVD, carotid bruits, or masses noted.  Cardiac: Regular rate and rhythm. +S3 gallop. No edema.  Respiratory:  Lungs are clear to auscultation bilaterally with normal work of breathing.  GI: Soft and nontender.  MS: No deformity or atrophy. Gait and ROM intact. She is using a cane.  Skin: Warm and dry. Color is normal.  Neuro:  Strength and sensation are intact and no gross focal deficits noted.  Psych: Alert, appropriate and with normal affect.   LABORATORY DATA:  EKG:  EKG is not ordered today.  Lab Results  Component Value Date   WBC 7.0 11/10/2014   HGB 13.0 11/10/2014   HCT 39.2 11/10/2014   PLT 132* 11/10/2014   GLUCOSE 92 11/10/2014   CHOL 151 08/19/2014   TRIG 134.0 08/19/2014   HDL 53.80 08/19/2014   LDLCALC 70 08/19/2014   ALT 13 08/19/2014   AST 19 08/19/2014   NA 142 11/10/2014   K 3.9 11/10/2014   CL 107 11/10/2014   CREATININE 1.23* 11/10/2014   BUN 27* 11/10/2014   CO2 25 11/10/2014   TSH 1.97 08/19/2014   INR 0.99 11/10/2014   HGBA1C  12/20/2007    5.7 (NOTE)   The ADA recommends the following therapeutic goals for glycemic   control related to Hgb A1C measurement:   Goal of Therapy:   < 7.0% Hgb A1C   Action Suggested:  > 8.0% Hgb A1C   Ref:  Diabetes Care, 22, Suppl. 1, 1999    BNP (last 3 results)  Recent Labs  11/10/14 0120  BNP 45.5    ProBNP (last 3 results) No results for input(s): PROBNP in the last 8760 hours.   Other Studies Reviewed Today:  Echo Study Conclusions from 2012  - Left ventricle: The cavity size was normal. Wall thickness was normal. Systolic function was severely reduced. The estimated ejection fraction was in the range of 25% to 30%. There is akinesis of the anteroseptal myocardium. There is akinesis of the apical myocardium. There is akinesis  of the mid-distal inferior myocardium. Doppler parameters are consistent with abnormal left ventricular relaxation (grade 1 diastolic dysfunction). - Ventricular septum: Septal motion showed abnormal function and dyssynergy. - Aortic valve: Trivial regurgitation. - Mitral valve: Mild regurgitation. - Left atrium: The atrium was mildly dilated.  Assessment/Plan: 1. Ischemic CM - EF is 25 to 30% - managed conservatively -  she continues to do very well and is asymptomatic. She has a chronic S3 on exam  but no signs of volume overload - I would consider conservative management. I have left her on her current regimen. Labs today.   2. HLD - on low dose statin therapy - checking labs today  3. Advanced age  31. Chronic LBBB   5. HTN - blood pressure looks fine. No change in her current regimen.   6. Falls - no serious injury  7. Prior stroke - just on aspirin therapy   8. Hypothyroid - recheck TSH  Current medicines are reviewed with the patient today.  The patient does not have concerns regarding medicines other than what has been noted above.  The following changes have been made:  See above.  Labs/ tests ordered today include:    Orders Placed This Encounter  Procedures  . Brain natriuretic peptide  . Basic metabolic panel  . Digoxin level  . TSH  . Lipid panel  . Hepatic function panel     Disposition:   FU with me in 6 months.   Patient is agreeable to this plan and will call if any problems develop in the interim.   Signed: Rosalio Macadamia, RN, ANP-C 09/16/2015 11:01 AM  [facility] [address] [place], [place]  [address] Phone: [phone] Fax: [phone]

## 2015-09-17 LAB — BRAIN NATRIURETIC PEPTIDE: Brain Natriuretic Peptide: 33.5 pg/mL (ref <100)

## 2015-09-17 LAB — DIGOXIN LEVEL: Digoxin Level: 0.5 ug/L — ABNORMAL LOW (ref 0.8–2.0)

## 2015-10-29 ENCOUNTER — Other Ambulatory Visit: Payer: Self-pay | Admitting: *Deleted

## 2015-10-29 MED ORDER — DIGOXIN 125 MCG PO TABS: ORAL_TABLET | ORAL | Status: DC

## 2016-01-29 ENCOUNTER — Other Ambulatory Visit: Payer: Self-pay | Admitting: *Deleted

## 2016-01-29 MED ORDER — NITROGLYCERIN 0.4 MG SL SUBL: 0.4000 mg | SUBLINGUAL_TABLET | SUBLINGUAL | Status: DC | PRN

## 2016-03-07 ENCOUNTER — Encounter: Payer: Self-pay | Admitting: Nurse Practitioner

## 2016-03-22 ENCOUNTER — Encounter (INDEPENDENT_AMBULATORY_CARE_PROVIDER_SITE_OTHER): Payer: Self-pay

## 2016-03-22 ENCOUNTER — Encounter: Payer: Self-pay | Admitting: Nurse Practitioner

## 2016-03-22 ENCOUNTER — Ambulatory Visit (INDEPENDENT_AMBULATORY_CARE_PROVIDER_SITE_OTHER): Payer: Medicare Other | Admitting: Nurse Practitioner

## 2016-03-22 VITALS — BP 120/60 | HR 88 | Ht 66.0 in | Wt 133.0 lb

## 2016-03-22 DIAGNOSIS — I10 Essential (primary) hypertension: Secondary | ICD-10-CM

## 2016-03-22 DIAGNOSIS — I255 Ischemic cardiomyopathy: Secondary | ICD-10-CM

## 2016-03-22 DIAGNOSIS — E785 Hyperlipidemia, unspecified: Secondary | ICD-10-CM

## 2016-03-22 MED ORDER — ATORVASTATIN CALCIUM 10 MG PO TABS: 10.0000 mg | ORAL_TABLET | Freq: Every day | ORAL | 3 refills | Status: DC

## 2016-03-22 NOTE — Progress Notes (Signed)
CARDIOLOGY OFFICE NOTE  Date:  03/22/2016    [name] Date of Birth: March 25, 1917 Medical Record #[id number]  PCP:  Lorenda PeckOBERTS, RONALD WAYNE, MD  Cardiologist:  [facility]ochrein   Chief Complaint  Patient presents with  . Cardiomyopathy    6 month check - seen for Dr. Antoine PocheHochrein    History of Present Illness: [name] is a 82 y.o. female who presents today for a follow up visit.  Seen for Dr. Antoine PocheHochrein.Former patient of Dr. Ronnald Nianennant's and typically follows with me.  She has an ischemic CM and has been managed conservatively. EF is 25 to 30% per echo back in March of 2012. Other issues include prior stroke in December of 2012. She was previously on chronic Plavix. She has a chronic LBBB, hypothyroidism and HLD.   Last seen here in March and was doing ok.   Comes in today. Here alone. Very talkative today. Did not want to come out in the bad weather. She says she is doing alright but her memory is going. Notes that she forgets easily and is "definitely" aware of it. Still driving. Has not gotten lost. Only drives to church and grocery store - about 10 minutes from her house. She says she saw Dr. Su Hiltoberts about 4 weeks ago and she feels like he did her labs. She rambles a lot about various people that have been in her life - she has always had that tendency for as long as I have known her. Breathing is stable. No chest pain. No recent falls. Has already had her flu shot.   Past Medical History:  Diagnosis Date  . Advanced age   . Cardiac arrest [facility](HCC) 2007   Occurred during planned ovarian surgery that resolved spontaneously after her stomach was deflated.   . CVA (cerebral infarction) Oct 2010   "light stroke". Plavix started.  . Grief    from husband's death  . Hyperlipemia   . Hypertension   . Hypothyroidism   . Ischemic cardiomyopathy June 2009   managed medically; EF is 20 to 25%  . Left bundle branch block   . Left heart failure (HCC)    EF is 20 to 25%;  She is managed conservatively  . Stroke (HCC)   . Subendocardial myocardial infarction [facility](HCC) June 2009    Past Surgical History:  Procedure Laterality Date  . CATARACT EXTRACTION  09/18/2012   right eye  . DILATION AND CURETTAGE OF UTERUS    . OVARIAN CYST REMOVAL     2007-2008     Medications: Current Outpatient Prescriptions  Medication Sig Dispense Refill  . aspirin EC 81 MG tablet Take 81 mg by mouth daily.    Marland Kitchen. atorvastatin (LIPITOR) 10 MG tablet TAKE 1 TABLET BY MOUTH DAILY 90 tablet 0  . digoxin (DIGITEK) 0.125 MG tablet TAKE 1 TABLET (0.125 MG TOTAL) BY MOUTH DAILY. 90 tablet 3  . furosemide (LASIX) 20 MG tablet Take 1 tablet (20 mg total) by mouth daily. 90 tablet 3  . levothyroxine (SYNTHROID, LEVOTHROID) 75 MCG tablet Take 75 mcg by mouth daily.    . nitroGLYCERIN (NITRODUR - DOSED IN MG/24 HR) 0.2 mg/hr patch PLACE ONE PATCH ONTO SKIN ONCE DAILY AT BEDTIME 30 patch 4  . nitroGLYCERIN (NITROSTAT) 0.4 MG SL tablet Place 1 tablet (0.4 mg total) under the tongue every 5 (five) minutes as needed for chest pain. 25 tablet 5  . potassium chloride (K-DUR) 10 MEQ tablet TAKE 1 TABLET (10  MEQ TOTAL) BY MOUTH DAILY. 30 tablet 9   No current facility-administered medications for this visit.     Allergies: Allergies  Allergen Reactions  . Procaine Hcl Hypertension    PASSED OUT    Social History: The patient  reports that she quit smoking about 47 years ago. She has never used smokeless tobacco. She reports that she does not drink alcohol or use drugs.   Family History: The patient's family history includes Aneurysm in her father; Heart attack in her mother; Heart defect in her mother.   Review of Systems: Please see the history of present illness.   Otherwise, the review of systems is positive for none.   All other systems are reviewed and negative.   Physical Exam: VS:  BP 120/60   Pulse 88   Ht 5\' 6"  (1.676 m)   Wt 133 lb (60.3 kg)   SpO2 97%   BMI 21.47 kg/m  .   BMI Body mass index is 21.47 kg/m.  Wt Readings from Last 3 Encounters:  03/22/16 133 lb (60.3 kg)  09/16/15 140 lb 12.8 oz (63.9 kg)  03/20/15 144 lb 6.4 oz (65.5 kg)    General: Pleasant. Elderly female who is alert and in no acute distress.  She has lost 7 pounds. She is very talkative.  HEENT: Normal.  Neck: Supple, no JVD, carotid bruits, or masses noted.  Cardiac: Regular rate and rhythm. No murmurs, rubs, or gallops. No edema.  Respiratory:  Lungs are clear to auscultation bilaterally with normal work of breathing.  GI: Soft and nontender.  MS: No deformity or atrophy. Gait and ROM intact. She is using her cane.  Skin: Warm and dry. Color is normal.  Neuro:  Strength and sensation are intact and no gross focal deficits noted.  Psych: Alert, appropriate and with normal affect.   LABORATORY DATA:  EKG:  EKG is not ordered today.  Lab Results  Component Value Date   WBC 7.0 11/10/2014   HGB 13.0 11/10/2014   HCT 39.2 11/10/2014   PLT 132 (L) 11/10/2014   GLUCOSE 95 09/16/2015   CHOL 171 09/16/2015   TRIG 160 (H) 09/16/2015   HDL 56 09/16/2015   LDLCALC 83 09/16/2015   ALT 14 09/16/2015   AST 21 09/16/2015   NA 140 09/16/2015   K 4.0 09/16/2015   CL 102 09/16/2015   CREATININE 1.09 (H) 09/16/2015   BUN 19 09/16/2015   CO2 28 09/16/2015   TSH 1.29 09/16/2015   INR 0.99 11/10/2014   HGBA1C  12/20/2007    5.7 (NOTE)   The ADA recommends the following therapeutic goals for glycemic   control related to Hgb A1C measurement:   Goal of Therapy:   < 7.0% Hgb A1C   Action Suggested:  > 8.0% Hgb A1C   Ref:  Diabetes Care, 22, Suppl. 1, 1999    BNP (last 3 results)  Recent Labs  09/16/15 1139  BNP 33.5    ProBNP (last 3 results) No results for input(s): PROBNP in the last 8760 hours.   Other Studies Reviewed Today:  Echo Study Conclusions from 2012  - Left ventricle: The cavity size was normal. Wall thickness was normal. Systolic function was severely  reduced. The estimated ejection fraction was in the range of 25% to 30%. There is akinesis of the anteroseptal myocardium. There is akinesis of the apical myocardium. There is akinesis of the mid-distal inferior myocardium. Doppler parameters are consistent with abnormal left ventricular relaxation (grade 1  diastolic dysfunction). - Ventricular septum: Septal motion showed abnormal function and dyssynergy. - Aortic valve: Trivial regurgitation. - Mitral valve: Mild regurgitation. - Left atrium: The atrium was mildly dilated.  Assessment/Plan: 1. Ischemic CM - EF is 25 to 30% - managed conservatively - she continues to do very well in spite of her age and is asymptomatic. She has a chronic S3 on exam but no signs of volume overload - I would consider conservative management. I have left her on her current regimen.   2. HLD - on low dose statin therapy   3. Advanced age  46. Chronic LBBB   5. HTN - blood pressure looks fine. No change in her current regimen.   6. Prior stroke - just on aspirin therapy - no recurrence  7. Hypothyroid - on replacement  Current medicines are reviewed with the patient today.  The patient does not have concerns regarding medicines other than what has been noted above.  The following changes have been made:  See above.  Labs/ tests ordered today include:    No orders of the defined types were placed in this encounter.    Disposition:   FU with me in 6 months.  Patient is agreeable to this plan and will call if any problems develop in the interim.   Signed: Rosalio Macadamia, RN, ANP-C 03/22/2016 11:27 AM  [facility] [address] [place], [place]  [address] Phone: [phone] Fax: [phone]

## 2016-03-22 NOTE — Patient Instructions (Addendum)
We will be checking the following labs today - NONE   Medication Instructions:    Continue with your current medicines.   I did refill your Lipitor today - this is your cholesterol medicine    Testing/Procedures To Be Arranged:  N/A  Follow-Up:   See me in 6 months    Other Special Instructions:   N/A    If you need a refill on your cardiac medications before your next appointment, please call your pharmacy.   Call the [facility] office at [phone] if you have any questions, problems or concerns.

## 2016-03-29 ENCOUNTER — Other Ambulatory Visit: Payer: Self-pay | Admitting: Internal Medicine

## 2016-03-29 DIAGNOSIS — Z1231 Encounter for screening mammogram for malignant neoplasm of breast: Secondary | ICD-10-CM

## 2016-05-04 ENCOUNTER — Other Ambulatory Visit: Payer: Self-pay | Admitting: Internal Medicine

## 2016-05-04 ENCOUNTER — Ambulatory Visit
Admission: RE | Admit: 2016-05-04 | Discharge: 2016-05-04 | Disposition: A | Discharge status: Home / Self Care | Payer: Medicare Other | Source: Ambulatory Visit | Attending: Internal Medicine | Admitting: Internal Medicine

## 2016-05-04 DIAGNOSIS — S0990XA Unspecified injury of head, initial encounter: Secondary | ICD-10-CM

## 2016-05-19 ENCOUNTER — Ambulatory Visit
Admission: RE | Admit: 2016-05-19 | Discharge: 2016-05-19 | Disposition: A | Discharge status: Home / Self Care | Payer: Medicare Other | Source: Ambulatory Visit | Attending: Internal Medicine | Admitting: Internal Medicine

## 2016-05-19 DIAGNOSIS — Z1231 Encounter for screening mammogram for malignant neoplasm of breast: Secondary | ICD-10-CM

## 2016-09-20 NOTE — Progress Notes (Deleted)
CARDIOLOGY OFFICE NOTE  Date:  09/21/2016    [name] Date of Birth: Sep 15, 1916 Medical Record #[id number]  PCP:  Lorenda PeckOBERTS, RONALD WAYNE, MD  Cardiologist:  [facility]age[facility]ochrein   No chief complaint on file.   History of Present Illness: Tanya KinsWilma L Duarte is a 83 y.o. female who presents today for a 6 month check. Seen for Dr. Antoine PocheHochrein.Former patient of Dr. Ronnald Nianennant's and typically follows with me.  She has an ischemic CM and has been managed conservatively. EF is 25 to 30% per echo back in March of 2012. Other issues include prior stroke in December of 2012. She was previously on chronic Plavix. She has a chronic LBBB, hypothyroidism and HLD.   Last seen here in September and was doing ok.   Comes in today. Here alone.   Past Medical History:  Diagnosis Date  . Advanced age   . Cardiac arrest [facility](HCC) 2007   Occurred during planned ovarian surgery that resolved spontaneously after her stomach was deflated.   . CVA (cerebral infarction) Oct 2010   "light stroke". Plavix started.  . Grief    from husband's death  . Hyperlipemia   . Hypertension   . Hypothyroidism   . Ischemic cardiomyopathy June 2009   managed medically; EF is 20 to 25%  . Left bundle branch block   . Left heart failure (HCC)    EF is 20 to 25%; She is managed conservatively  . Stroke (HCC)   . Subendocardial myocardial infarction [facility](HCC) June 2009    Past Surgical History:  Procedure Laterality Date  . CATARACT EXTRACTION  09/18/2012   right eye  . DILATION AND CURETTAGE OF UTERUS    . OVARIAN CYST REMOVAL     2007-2008     Medications: Current Outpatient Prescriptions  Medication Sig Dispense Refill  . aspirin EC 81 MG tablet Take 81 mg by mouth daily.    Marland Kitchen. atorvastatin (LIPITOR) 10 MG tablet Take 1 tablet (10 mg total) by mouth daily. 90 tablet 3  . digoxin (DIGITEK) 0.125 MG tablet TAKE 1 TABLET (0.125 MG TOTAL) BY MOUTH DAILY. 90 tablet 3  . furosemide (LASIX) 20 MG tablet Take  1 tablet (20 mg total) by mouth daily. 90 tablet 3  . levothyroxine (SYNTHROID, LEVOTHROID) 75 MCG tablet Take 75 mcg by mouth daily.    . nitroGLYCERIN (NITRODUR - DOSED IN MG/24 HR) 0.2 mg/hr patch PLACE ONE PATCH ONTO SKIN ONCE DAILY AT BEDTIME 30 patch 4  . nitroGLYCERIN (NITROSTAT) 0.4 MG SL tablet Place 1 tablet (0.4 mg total) under the tongue every 5 (five) minutes as needed for chest pain. 25 tablet 5  . potassium chloride (K-DUR) 10 MEQ tablet TAKE 1 TABLET (10 MEQ TOTAL) BY MOUTH DAILY. 30 tablet 9   No current facility-administered medications for this visit.     Allergies: Allergies  Allergen Reactions  . Procaine Hcl Hypertension    PASSED OUT    Social History: The patient  reports that she quit smoking about 48 years ago. She has never used smokeless tobacco. She reports that she does not drink alcohol or use drugs.   Family History: The patient's family history includes Aneurysm in her father; Heart attack in her mother; Heart defect in her mother.   Review of Systems: Please see the history of present illness.   Otherwise, the review of systems is positive for none.   All other systems are reviewed and negative.   Physical Exam: VS:  There were no vitals taken for this visit. Marland Kitchen  BMI There is no height or weight on file to calculate BMI.  Wt Readings from Last 3 Encounters:  03/22/16 133 lb (60.3 kg)  09/16/15 140 lb 12.8 oz (63.9 kg)  03/20/15 144 lb 6.4 oz (65.5 kg)    General: Pleasant. Well developed, well nourished and in no acute distress.   HEENT: Normal.  Neck: Supple, no JVD, carotid bruits, or masses noted.  Cardiac: Regular rate and rhythm. No murmurs, rubs, or gallops. No edema.  Respiratory:  Lungs are clear to auscultation bilaterally with normal work of breathing.  GI: Soft and nontender.  MS: No deformity or atrophy. Gait and ROM intact.  Skin: Warm and dry. Color is normal.  Neuro:  Strength and sensation are intact and no gross focal  deficits noted.  Psych: Alert, appropriate and with normal affect.   LABORATORY DATA:  EKG:  EKG is not ordered today.  Lab Results  Component Value Date   WBC 7.0 11/10/2014   HGB 13.0 11/10/2014   HCT 39.2 11/10/2014   PLT 132 (L) 11/10/2014   GLUCOSE 95 09/16/2015   CHOL 171 09/16/2015   TRIG 160 (H) 09/16/2015   HDL 56 09/16/2015   LDLCALC 83 09/16/2015   ALT 14 09/16/2015   AST 21 09/16/2015   NA 140 09/16/2015   K 4.0 09/16/2015   CL 102 09/16/2015   CREATININE 1.09 (H) 09/16/2015   BUN 19 09/16/2015   CO2 28 09/16/2015   TSH 1.29 09/16/2015   INR 0.99 11/10/2014   HGBA1C  12/20/2007    5.7 (NOTE)   The ADA recommends the following therapeutic goals for glycemic   control related to Hgb A1C measurement:   Goal of Therapy:   < 7.0% Hgb A1C   Action Suggested:  > 8.0% Hgb A1C   Ref:  Diabetes Care, 22, Suppl. 1, 1999    BNP (last 3 results) No results for input(s): BNP in the last 8760 hours.  ProBNP (last 3 results) No results for input(s): PROBNP in the last 8760 hours.   Other Studies Reviewed Today:  Echo Study Conclusions from 2012  - Left ventricle: The cavity size was normal. Wall thickness was normal. Systolic function was severely reduced. The estimated ejection fraction was in the range of 25% to 30%. There is akinesis of the anteroseptal myocardium. There is akinesis of the apical myocardium. There is akinesis of the mid-distal inferior myocardium. Doppler parameters are consistent with abnormal left ventricular relaxation (grade 1 diastolic dysfunction). - Ventricular septum: Septal motion showed abnormal function and dyssynergy. - Aortic valve: Trivial regurgitation. - Mitral valve: Mild regurgitation. - Left atrium: The atrium was mildly dilated.  Assessment/Plan:  1. Ischemic CM - EF is 25 to 30% - managed conservatively - she continues to do very well in spite of her age and is asymptomatic. She has a chronic S3 on exam  but no signs of volume overload - I would consider conservative management. I have left her on her current regimen.   2. HLD - on low dose statin therapy   3. Advanced age  40. Chronic LBBB   5. HTN - blood pressure looks fine. No change in her current regimen.   6. Prior stroke - just on aspirin therapy - no recurrence  7. Hypothyroid - on replacement   Current medicines are reviewed with the patient today.  The patient does not have concerns regarding medicines other than what has been  noted above.  The following changes have been made:  See above.  Labs/ tests ordered today include:   No orders of the defined types were placed in this encounter.    Disposition:   FU with *** in {gen number 1-61:096045} {Days to years:10300}.   Patient is agreeable to this plan and will call if any problems develop in the interim.   SignedNorma Fredrickson, NP  09/21/2016 11:01 AM  [facility] [address] [place], [place]  [address] Phone: [phone] Fax: [phone]

## 2016-09-21 ENCOUNTER — Ambulatory Visit: Payer: Medicare Other | Admitting: Nurse Practitioner

## 2016-10-03 ENCOUNTER — Encounter: Payer: Self-pay | Admitting: Nurse Practitioner

## 2016-10-03 ENCOUNTER — Ambulatory Visit (INDEPENDENT_AMBULATORY_CARE_PROVIDER_SITE_OTHER): Payer: Medicare Other | Admitting: Nurse Practitioner

## 2016-10-03 ENCOUNTER — Telehealth: Payer: Self-pay | Admitting: Nurse Practitioner

## 2016-10-03 VITALS — BP 110/70 | HR 80 | Ht 66.0 in | Wt 132.0 lb

## 2016-10-03 DIAGNOSIS — Z79899 Other long term (current) drug therapy: Secondary | ICD-10-CM

## 2016-10-03 DIAGNOSIS — E039 Hypothyroidism, unspecified: Secondary | ICD-10-CM | POA: Diagnosis not present

## 2016-10-03 DIAGNOSIS — I255 Ischemic cardiomyopathy: Secondary | ICD-10-CM | POA: Diagnosis not present

## 2016-10-03 DIAGNOSIS — E7800 Pure hypercholesterolemia, unspecified: Secondary | ICD-10-CM

## 2016-10-03 DIAGNOSIS — I2589 Other forms of chronic ischemic heart disease: Secondary | ICD-10-CM

## 2016-10-03 NOTE — Patient Instructions (Addendum)
We will be checking the following labs today - BMET, CBC,, TSH and dig level   Medication Instructions:    Continue with your current medicines. BUT  STOP LIPITOR - I have put an X on this bottle.     Testing/Procedures To Be Arranged:  N/A  Follow-Up:   See me in 6 months.    Other Special Instructions:   Would favor using a walker.     If you need a refill on your cardiac medications before your next appointment, please call your pharmacy.   Call the [facility] office at [phone] if you have any questions, problems or concerns.
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
## 2016-10-03 NOTE — Telephone Encounter (Signed)
s/w pt's daughter is going to try miralax for constipation.

## 2016-10-03 NOTE — Telephone Encounter (Signed)
New message  Pt daughter call requesting a call when pt is ready for pick up. Please call back

## 2016-10-03 NOTE — Telephone Encounter (Signed)
[name]( Daughter) is calling because Mrs. Tanya DupreCrawford says Lawson FiscalLori was was going to give her something for constipatation and she didn't give it to her . Please call back on [phone]

## 2016-10-04 LAB — DIGOXIN LEVEL: Digoxin, Serum: 0.7 ng/mL (ref 0.5–0.9)

## 2016-10-04 LAB — CBC
Hematocrit: 37.5 % (ref 34.0–46.6)
Hemoglobin: 12.3 g/dL (ref 11.1–15.9)
MCH: 29.4 pg (ref 26.6–33.0)
MCHC: 32.8 g/dL (ref 31.5–35.7)
MCV: 90 fL (ref 79–97)
Platelets: 149 10*3/uL — ABNORMAL LOW (ref 150–379)
RBC: 4.19 x10E6/uL (ref 3.77–5.28)
RDW: 14.5 % (ref 12.3–15.4)
WBC: 6.2 10*3/uL (ref 3.4–10.8)

## 2016-10-04 LAB — TSH: TSH: 2.72 u[IU]/mL (ref 0.450–4.500)

## 2016-10-04 LAB — BASIC METABOLIC PANEL
BUN/Creatinine Ratio: 21 (ref 12–28)
BUN: 24 mg/dL (ref 10–36)
CO2: 27 mmol/L (ref 18–29)
Calcium: 9.1 mg/dL (ref 8.7–10.3)
Chloride: 102 mmol/L (ref 96–106)
Creatinine, Ser: 1.12 mg/dL — ABNORMAL HIGH (ref 0.57–1.00)
GFR calc Af Amer: 47 mL/min/{1.73_m2} — ABNORMAL LOW (ref >59)
GFR calc non Af Amer: 41 mL/min/{1.73_m2} — ABNORMAL LOW (ref >59)
Glucose: 91 mg/dL (ref 65–99)
Potassium: 4.3 mmol/L (ref 3.5–5.2)
Sodium: 143 mmol/L (ref 134–144)

## 2017-03-28 ENCOUNTER — Ambulatory Visit: Payer: Medicare Other | Admitting: Nurse Practitioner

## 2017-04-05 ENCOUNTER — Emergency Department (HOSPITAL_COMMUNITY)
Admission: EM | Admit: 2017-04-05 | Discharge: 2017-04-06 | Disposition: A | Discharge status: Home / Self Care | Payer: Medicare Other | Attending: Emergency Medicine | Admitting: Emergency Medicine

## 2017-04-05 ENCOUNTER — Encounter (HOSPITAL_COMMUNITY): Payer: Self-pay | Admitting: *Deleted

## 2017-04-05 ENCOUNTER — Emergency Department (HOSPITAL_COMMUNITY): Payer: Medicare Other

## 2017-04-05 DIAGNOSIS — Z87891 Personal history of nicotine dependence: Secondary | ICD-10-CM | POA: Insufficient documentation

## 2017-04-05 DIAGNOSIS — S52502B Unspecified fracture of the lower end of left radius, initial encounter for open fracture type I or II: Secondary | ICD-10-CM | POA: Diagnosis not present

## 2017-04-05 DIAGNOSIS — Y939 Activity, unspecified: Secondary | ICD-10-CM | POA: Insufficient documentation

## 2017-04-05 DIAGNOSIS — I509 Heart failure, unspecified: Secondary | ICD-10-CM | POA: Insufficient documentation

## 2017-04-05 DIAGNOSIS — Y929 Unspecified place or not applicable: Secondary | ICD-10-CM | POA: Diagnosis not present

## 2017-04-05 DIAGNOSIS — W19XXXA Unspecified fall, initial encounter: Secondary | ICD-10-CM | POA: Diagnosis not present

## 2017-04-05 DIAGNOSIS — Y999 Unspecified external cause status: Secondary | ICD-10-CM | POA: Diagnosis not present

## 2017-04-05 DIAGNOSIS — Z79899 Other long term (current) drug therapy: Secondary | ICD-10-CM | POA: Insufficient documentation

## 2017-04-05 DIAGNOSIS — I252 Old myocardial infarction: Secondary | ICD-10-CM | POA: Insufficient documentation

## 2017-04-05 DIAGNOSIS — Z7982 Long term (current) use of aspirin: Secondary | ICD-10-CM | POA: Insufficient documentation

## 2017-04-05 DIAGNOSIS — S52612B Displaced fracture of left ulna styloid process, initial encounter for open fracture type I or II: Secondary | ICD-10-CM | POA: Diagnosis not present

## 2017-04-05 DIAGNOSIS — Z8674 Personal history of sudden cardiac arrest: Secondary | ICD-10-CM | POA: Diagnosis not present

## 2017-04-05 DIAGNOSIS — S62102B Fracture of unspecified carpal bone, left wrist, initial encounter for open fracture: Secondary | ICD-10-CM

## 2017-04-05 DIAGNOSIS — I110 Hypertensive heart disease with heart failure: Secondary | ICD-10-CM | POA: Insufficient documentation

## 2017-04-05 DIAGNOSIS — Z8673 Personal history of transient ischemic attack (TIA), and cerebral infarction without residual deficits: Secondary | ICD-10-CM | POA: Insufficient documentation

## 2017-04-05 DIAGNOSIS — S6992XA Unspecified injury of left wrist, hand and finger(s), initial encounter: Secondary | ICD-10-CM | POA: Diagnosis present

## 2017-04-05 MED ORDER — CEPHALEXIN 500 MG PO CAPS: 500.0000 mg | ORAL_CAPSULE | Freq: Three times a day (TID) | ORAL | 0 refills | Status: DC

## 2017-04-05 MED ORDER — CEFAZOLIN SODIUM-DEXTROSE 2-4 GM/100ML-% IV SOLN
2.0000 g | Freq: Once | INTRAVENOUS | Status: AC
  Administered 2017-04-06: 2 g via INTRAVENOUS
  Filled 2017-04-05: qty 100

## 2017-04-05 NOTE — Discharge Instructions (Signed)
Go to Dr. Debby Bud office at 9 AM in the morning. Take the antibiotics.

## 2017-04-05 NOTE — ED Provider Notes (Signed)
[facility]-EMERGENCY DEPT Provider Note   CSN: [id number] Arrival date & time: 04/05/17  1900     History   Chief Complaint Chief Complaint  Patient presents with  . Fall  . Wrist Pain    HPI [name] is a 83 y.o. female.  HPI Patient presents after mechanical fall. Lost her balance tripped forward and landed on her left wrist. Has large laceration on the dorsal aspect. Also has abrasions to her knees but states her knees do not really hurt. Did not hit her head. She has been ambulatory. Patient's daughter states the patient cannot have surgery cruciate cardiac arrest last time they tried to do one. i   Past Medical History:  Diagnosis Date  . Advanced age   . Cardiac arrest [facility]) 2007   Occurred during planned ovarian surgery that resolved spontaneously after her stomach was deflated.   . CVA (cerebral infarction) Oct 2010   "light stroke". Plavix started.  . Grief    from husband's death  . Hyperlipemia   . Hypertension   . Hypothyroidism   . Ischemic cardiomyopathy June 2009   managed medically; EF is 20 to 25%  . Left bundle branch block   . Left heart failure (HCC)    EF is 20 to 25%; She is managed conservatively  . Stroke (HCC)   . Subendocardial myocardial infarction [facility]) June 2009    Patient Active Problem List   Diagnosis Date Noted  . CVA (cerebral infarction) 08/04/2011  . Ischemic cardiomyopathy   . Hypothyroidism   . Hyperlipemia     Past Surgical History:  Procedure Laterality Date  . CATARACT EXTRACTION  09/18/2012   right eye  . DILATION AND CURETTAGE OF UTERUS    . OVARIAN CYST REMOVAL     2007-2008    OB History    No data available       Home Medications    Prior to Admission medications   Medication Sig Start Date End Date Taking? Authorizing Provider  aspirin EC 81 MG tablet Take 81 mg by mouth daily.    [provider]  cholecalciferol (VITAMIN D) 1000 units tablet Take 1,000 Units by mouth daily.    [provider]  digoxin (DIGITEK) 0.125 MG tablet TAKE 1 TABLET (0.125 MG TOTAL) BY MOUTH DAILY. 10/29/15   Rollene Rotunda, MD  furosemide (LASIX) 20 MG tablet Take 1 tablet (20 mg total) by mouth daily. 09/16/15   Rosalio Macadamia, NP  levothyroxine (SYNTHROID, LEVOTHROID) 75 MCG tablet Take 75 mcg by mouth daily.    [provider]  magnesium gluconate (MAGONATE) 500 MG tablet Take 500 mg by mouth daily.    [provider]  Multiple Vitamins-Minerals (CENTRUM SILVER PO) Take 1 tablet by mouth daily.    [provider]  nitroGLYCERIN (NITRODUR - DOSED IN MG/24 HR) 0.2 mg/hr patch PLACE ONE PATCH ONTO SKIN ONCE DAILY AT BEDTIME 01/27/15   Rollene Rotunda, MD  nitroGLYCERIN (NITROSTAT) 0.4 MG SL tablet Place 1 tablet (0.4 mg total) under the tongue every 5 (five) minutes as needed for chest pain. 01/29/16   Rosalio Macadamia, NP  potassium chloride (K-DUR) 10 MEQ tablet TAKE 1 TABLET (10 MEQ TOTAL) BY MOUTH DAILY. 07/01/15   Rosalio Macadamia, NP    Family History Family History  Problem Relation Age of Onset  . Aneurysm Father        ruptured  . Heart attack Mother   . Heart defect Mother  enlarged heart    Social History Social History  Substance Use Topics  . Smoking status: Former Smoker    Quit date: 07/11/1968  . Smokeless tobacco: Never Used  . Alcohol use No     Allergies   Procaine hcl   Review of Systems Review of Systems  Constitutional: Negative for fever.  HENT: Negative for congestion.   Cardiovascular: Negative for chest pain.  Gastrointestinal: Negative for abdominal pain.  Musculoskeletal: Negative for back pain.  Skin: Positive for wound.  Neurological: Negative for weakness and numbness.  Hematological: Bruises/bleeds easily.     Physical Exam Updated Vital Signs BP (!) 134/91   Pulse 88   Temp 98 F (36.7 C) (Oral)   Resp 16   SpO2 98%   Physical Exam  Constitutional: She appears well-developed.  HENT:  Head:  Atraumatic.  Eyes: EOM are normal.  Neck: Neck supple.  Cardiovascular: Normal rate.   Pulmonary/Chest: Effort normal.  Abdominal: There is no tenderness.  Musculoskeletal: She exhibits deformity.  Some deformity of left wrist. Mild lateral angulation of left wrist. On the dorsal aspect of the wrist over the ulnar aspect there is a large laceration. Skin is not approximate all that well. It appears deep. Neurovascular intact over hand. Good capillary refill. No tenderness over her elbow or shoulder. Mild abrasion over left knee without underlying bony tenderness.  Neurological: She is alert.  Skin: Capillary refill takes less than 2 seconds.     ED Treatments / Results  Labs (all labs ordered are listed, but only abnormal results are displayed) Labs Reviewed - No data to display  EKG  EKG Interpretation None       Radiology Dg Wrist Complete Left  Result Date: 04/05/2017 CLINICAL DATA:  Patient fell after tripping on sidewalk. EXAM: LEFT WRIST - COMPLETE 3+ VIEW COMPARISON:  None. FINDINGS: There is an acute, closed, comminuted fracture of the distal radial metaphysis and epiphysis extending into the radiocarpal joint at the level of the lunate and also with extension into the distal radioulnar joint. Slight radial displacement of the main fracture fragment with slight dorsal angulation is noted. Fracture of the ulnar styloid is also seen. Osteoarthritic joint space narrowing of the triscaphe and first CMC joints. No fracture of the carpal bones. Bandaging limits fine bony detail about the wrist and distal forearm. IMPRESSION: 1. Acute, closed, comminuted fracture of the distal radial metaphysis and epiphysis with extension of fracture into the radiocarpal and distal radioulnar joints as above described. Slight radial and dorsal angulation of the main distal radius fracture fragment. 2. Acute ulnar styloid fracture. 3. First CMC and triscaphe joint osteoarthritis. Electronically Signed    By: Tollie Eth M.D.   On: 04/05/2017 20:26    Procedures Procedures (including critical care time)  Medications Ordered in ED Medications  ceFAZolin (ANCEF) IVPB 2g/100 mL premix (not administered)     Initial Impression / Assessment and Plan / ED Course  I have reviewed the triage vital signs and the nursing notes.  Pertinent labs & imaging results that were available during my care of the patient were reviewed by me and considered in my medical decision making (see chart for details).     Patient with likely open fracture of left wrist. Has distal radius fracture and ulnar styloid fracture. There is a moderate laceration of the ulnar styloid. Discussed with Dr. Izora Ribas, the hand surgeon. Recommends antibiotics and he'll see her in the office tomorrow. Wound copiously irrigated with a liter of  normal saline. Sugar tong placed above non-care dressings. IV Ancef were given. Will follow-up in the morning.  Final Clinical Impressions(s) / ED Diagnoses   Final diagnoses:  Open fracture of left wrist, initial encounter    New Prescriptions New Prescriptions   No medications on file     Benjiman Core, MD 04/05/17 2336

## 2017-04-05 NOTE — ED Notes (Signed)
Bed: [facility] Expected date:  Expected time:  Means of arrival:  Comments: 

## 2017-04-05 NOTE — ED Notes (Signed)
ED Provider at bedside. 

## 2017-04-06 DIAGNOSIS — S52502B Unspecified fracture of the lower end of left radius, initial encounter for open fracture type I or II: Secondary | ICD-10-CM | POA: Diagnosis not present

## 2017-04-06 MED ORDER — ACETAMINOPHEN 325 MG PO TABS
650.0000 mg | ORAL_TABLET | Freq: Once | ORAL | Status: DC
  Filled 2017-04-06: qty 2

## 2017-04-06 NOTE — ED Notes (Signed)
Patients family requested TYLENOL. Received an order. When I went back into the room to give medication and review the discharge papers, patient had left with family member and discharge instructions.

## 2017-04-06 NOTE — ED Notes (Signed)
Bed: [facility]03 Expected date:  Expected time:  Means of arrival:  Comments: 

## 2017-04-17 ENCOUNTER — Emergency Department (HOSPITAL_COMMUNITY): Payer: Medicare Other

## 2017-04-17 ENCOUNTER — Encounter (HOSPITAL_COMMUNITY): Payer: Self-pay

## 2017-04-17 ENCOUNTER — Emergency Department (HOSPITAL_COMMUNITY)
Admission: EM | Admit: 2017-04-17 | Discharge: 2017-04-18 | Disposition: A | Discharge status: Home / Self Care | Payer: Medicare Other | Source: Home / Self Care | Attending: Emergency Medicine | Admitting: Emergency Medicine

## 2017-04-17 DIAGNOSIS — E039 Hypothyroidism, unspecified: Secondary | ICD-10-CM

## 2017-04-17 DIAGNOSIS — M545 Low back pain, unspecified: Secondary | ICD-10-CM

## 2017-04-17 DIAGNOSIS — R531 Weakness: Secondary | ICD-10-CM | POA: Diagnosis not present

## 2017-04-17 DIAGNOSIS — Z79899 Other long term (current) drug therapy: Secondary | ICD-10-CM | POA: Insufficient documentation

## 2017-04-17 DIAGNOSIS — I509 Heart failure, unspecified: Secondary | ICD-10-CM

## 2017-04-17 DIAGNOSIS — R197 Diarrhea, unspecified: Secondary | ICD-10-CM | POA: Insufficient documentation

## 2017-04-17 DIAGNOSIS — Z87891 Personal history of nicotine dependence: Secondary | ICD-10-CM

## 2017-04-17 DIAGNOSIS — A0472 Enterocolitis due to Clostridium difficile, not specified as recurrent: Secondary | ICD-10-CM | POA: Diagnosis not present

## 2017-04-17 DIAGNOSIS — M62830 Muscle spasm of back: Secondary | ICD-10-CM

## 2017-04-17 DIAGNOSIS — N3000 Acute cystitis without hematuria: Secondary | ICD-10-CM | POA: Insufficient documentation

## 2017-04-17 DIAGNOSIS — I110 Hypertensive heart disease with heart failure: Secondary | ICD-10-CM

## 2017-04-17 DIAGNOSIS — A09 Infectious gastroenteritis and colitis, unspecified: Secondary | ICD-10-CM

## 2017-04-17 LAB — URINALYSIS, ROUTINE W REFLEX MICROSCOPIC
BILIRUBIN URINE: NEGATIVE
Glucose, UA: NEGATIVE mg/dL
KETONES UR: 20 mg/dL — AB
Nitrite: NEGATIVE
PROTEIN: 30 mg/dL — AB
Specific Gravity, Urine: 1.021 (ref 1.005–1.030)
pH: 5 (ref 5.0–8.0)

## 2017-04-17 LAB — COMPREHENSIVE METABOLIC PANEL
ALK PHOS: 59 U/L (ref 38–126)
ALT: 13 U/L — AB (ref 14–54)
AST: 21 U/L (ref 15–41)
Albumin: 3.4 g/dL — ABNORMAL LOW (ref 3.5–5.0)
Anion gap: 9 (ref 5–15)
BUN: 28 mg/dL — AB (ref 6–20)
CALCIUM: 8.6 mg/dL — AB (ref 8.9–10.3)
CHLORIDE: 103 mmol/L (ref 101–111)
CO2: 26 mmol/L (ref 22–32)
CREATININE: 1.06 mg/dL — AB (ref 0.44–1.00)
GFR calc Af Amer: 49 mL/min — ABNORMAL LOW (ref >60)
GFR calc non Af Amer: 42 mL/min — ABNORMAL LOW (ref >60)
Glucose, Bld: 92 mg/dL (ref 65–99)
Potassium: 3.5 mmol/L (ref 3.5–5.1)
SODIUM: 138 mmol/L (ref 135–145)
Total Bilirubin: 2.1 mg/dL — ABNORMAL HIGH (ref 0.3–1.2)
Total Protein: 6 g/dL — ABNORMAL LOW (ref 6.5–8.1)

## 2017-04-17 LAB — CBC WITH DIFFERENTIAL/PLATELET
BASOS ABS: 0 10*3/uL (ref 0.0–0.1)
Basophils Relative: 0 %
EOS PCT: 1 %
Eosinophils Absolute: 0.1 10*3/uL (ref 0.0–0.7)
HCT: 36.3 % (ref 36.0–46.0)
HEMOGLOBIN: 11.9 g/dL — AB (ref 12.0–15.0)
LYMPHS ABS: 1.4 10*3/uL (ref 0.7–4.0)
LYMPHS PCT: 11 %
MCH: 30.4 pg (ref 26.0–34.0)
MCHC: 32.8 g/dL (ref 30.0–36.0)
MCV: 92.6 fL (ref 78.0–100.0)
Monocytes Absolute: 1.7 10*3/uL — ABNORMAL HIGH (ref 0.1–1.0)
Monocytes Relative: 13 %
NEUTROS PCT: 75 %
Neutro Abs: 9.7 10*3/uL — ABNORMAL HIGH (ref 1.7–7.7)
PLATELETS: 156 10*3/uL (ref 150–400)
RBC: 3.92 MIL/uL (ref 3.87–5.11)
RDW: 14.2 % (ref 11.5–15.5)
WBC: 12.9 10*3/uL — AB (ref 4.0–10.5)

## 2017-04-17 LAB — LIPASE, BLOOD: Lipase: 29 U/L (ref 11–51)

## 2017-04-17 MED ORDER — CYCLOBENZAPRINE HCL 5 MG PO TABS
2.5000 mg | ORAL_TABLET | Freq: Once | ORAL | Status: AC
  Administered 2017-04-17: 2.5 mg via ORAL
  Filled 2017-04-17: qty 0.5

## 2017-04-17 MED ORDER — SODIUM CHLORIDE 0.9 % IV BOLUS (SEPSIS)
1000.0000 mL | Freq: Once | INTRAVENOUS | Status: AC
  Administered 2017-04-17: 1000 mL via INTRAVENOUS

## 2017-04-17 MED ORDER — FOSFOMYCIN TROMETHAMINE 3 G PO PACK
3.0000 g | PACK | Freq: Once | ORAL | Status: AC
  Administered 2017-04-17: 3 g via ORAL
  Filled 2017-04-17: qty 3

## 2017-04-17 MED ORDER — CEPHALEXIN 500 MG PO CAPS: 500.0000 mg | ORAL_CAPSULE | Freq: Once | ORAL | Status: DC

## 2017-04-17 MED ORDER — CYCLOBENZAPRINE HCL 10 MG PO TABS: 5.0000 mg | ORAL_TABLET | Freq: Once | ORAL | Status: DC

## 2017-04-17 NOTE — ED Provider Notes (Signed)
[facility]-EMERGENCY DEPT Provider Note   CSN: [id number] Arrival date & time: 04/17/17  1322     History   Chief Complaint Chief Complaint  Patient presents with  . Back Pain    HPI [name] is a 83 y.o. female.  The history is provided by the patient, a relative and medical records.  Back Pain   This is a new problem. The current episode started more than 1 week ago. The problem occurs constantly. The problem has been gradually worsening. The pain is associated with falling (2 weeks ago). The pain is present in the lumbar spine. The quality of the pain is described as stabbing and shooting. The pain does not radiate. The pain is at a severity of 10/10. The pain is severe. The symptoms are aggravated by bending and certain positions. Pertinent negatives include no chest pain, no fever, no numbness, no headaches, no abdominal pain, no abdominal swelling, no bowel incontinence, no perianal numbness, no bladder incontinence, no dysuria, no pelvic pain, no leg pain, no paresthesias, no paresis, no tingling and no weakness. She has tried nothing for the symptoms. The treatment provided no relief.    Past Medical History:  Diagnosis Date  . Advanced age   . Cardiac arrest [facility]) 2007   Occurred during planned ovarian surgery that resolved spontaneously after her stomach was deflated.   . CVA (cerebral infarction) Oct 2010   "light stroke". Plavix started.  . Grief    from husband's death  . Hyperlipemia   . Hypertension   . Hypothyroidism   . Ischemic cardiomyopathy June 2009   managed medically; EF is 20 to 25%  . Left bundle branch block   . Left heart failure (HCC)    EF is 20 to 25%; She is managed conservatively  . Stroke (HCC)   . Subendocardial myocardial infarction [facility]) June 2009    Patient Active Problem List   Diagnosis Date Noted  . CVA (cerebral infarction) 08/04/2011  . Ischemic cardiomyopathy   . Hypothyroidism   . Hyperlipemia     Past Surgical History:   Procedure Laterality Date  . CATARACT EXTRACTION  09/18/2012   right eye  . DILATION AND CURETTAGE OF UTERUS    . OVARIAN CYST REMOVAL     2007-2008    OB History    No data available       Home Medications    Prior to Admission medications   Medication Sig Start Date End Date Taking? Authorizing Provider  atorvastatin (LIPITOR) 10 MG tablet Take 10 mg by mouth daily. 02/06/17  Yes [provider]  cholecalciferol (VITAMIN D) 1000 units tablet Take 1,000 Units by mouth daily.   Yes [provider]  digoxin (DIGITEK) 0.125 MG tablet TAKE 1 TABLET (0.125 MG TOTAL) BY MOUTH DAILY. 10/29/15  Yes Rollene Rotunda, MD  diphenoxylate-atropine (LOMOTIL) 2.5-0.025 MG tablet Take 1 tablet by mouth 2 (two) times daily. 04/10/17  Yes [provider]  furosemide (LASIX) 20 MG tablet Take 1 tablet (20 mg total) by mouth daily. 09/16/15  Yes Rosalio Macadamia, NP  Multiple Vitamins-Minerals (CENTRUM SILVER PO) Take 1 tablet by mouth daily.   Yes [provider]  nitroGLYCERIN (NITRODUR - DOSED IN MG/24 HR) 0.2 mg/hr patch PLACE ONE PATCH ONTO SKIN ONCE DAILY AT BEDTIME 01/27/15  Yes Hochrein, Fayrene Fearing, MD  nitroGLYCERIN (NITROSTAT) 0.4 MG SL tablet Place 1 tablet (0.4 mg total) under the tongue every 5 (five) minutes as needed for chest pain. 01/29/16  Yes Rosalio Macadamia, NP  potassium chloride (K-DUR) 10 MEQ tablet TAKE 1 TABLET (10 MEQ TOTAL) BY MOUTH DAILY. 07/01/15  Yes Rosalio Macadamia, NP  SYNTHROID 50 MCG tablet Take 50 mcg by mouth daily. 03/21/17  Yes [provider]  cephALEXin (KEFLEX) 500 MG capsule Take 1 capsule (500 mg total) by mouth 3 (three) times daily. Patient not taking: Reported on 04/17/2017 04/05/17   Benjiman Core, MD    Family History Family History  Problem Relation Age of Onset  . Aneurysm Father        ruptured  . Heart attack Mother   . Heart defect Mother        enlarged heart    Social History Social History  Substance  Use Topics  . Smoking status: Former Smoker    Quit date: 07/11/1968  . Smokeless tobacco: Never Used  . Alcohol use No     Allergies   Procaine hcl   Review of Systems Review of Systems  Constitutional: Negative for chills, diaphoresis, fatigue and fever.  HENT: Negative for congestion.   Eyes: Negative for visual disturbance.  Respiratory: Negative for chest tightness, shortness of breath and wheezing.   Cardiovascular: Negative for chest pain.  Gastrointestinal: Positive for diarrhea. Negative for abdominal pain, bowel incontinence, constipation and vomiting.  Genitourinary: Positive for frequency. Negative for bladder incontinence, difficulty urinating, dysuria, flank pain and pelvic pain.  Musculoskeletal: Positive for back pain. Negative for neck pain and neck stiffness.  Skin: Negative for rash and wound.  Neurological: Negative for tingling, weakness, light-headedness, numbness, headaches and paresthesias.  Psychiatric/Behavioral: Negative for agitation.  All other systems reviewed and are negative.    Physical Exam Updated Vital Signs BP 136/64 (BP Location: Right Arm)   Pulse 82   Temp 98 F (36.7 C) (Axillary)   Resp 18   Ht  (1.676 m)   Wt 59 kg (130 lb)   SpO2 96%   BMI 20.98 kg/m   Physical Exam  Constitutional: She is oriented to person, place, and time. She appears well-developed and well-nourished. No distress.  HENT:  Head: Normocephalic.  Mouth/Throat: Oropharynx is clear and moist. No oropharyngeal exudate.  Eyes: Pupils are equal, round, and reactive to light. EOM are normal.  Neck: Normal range of motion.  Cardiovascular: Normal rate.   No murmur heard. Pulmonary/Chest: Effort normal and breath sounds normal. No stridor. No respiratory distress. She has no wheezes. She has no rales. She exhibits no tenderness.  Abdominal: Soft. Bowel sounds are normal. There is no guarding.  Musculoskeletal: She exhibits tenderness. She exhibits no edema.         Lumbar back: She exhibits tenderness, pain and spasm.       Back:  In L arm splint. Normal cap refill in L hand. Normal L hand sensation.   Neurological: She is alert and oriented to person, place, and time. She is not disoriented. No cranial nerve deficit or sensory deficit. She exhibits normal muscle tone. Coordination normal. GCS eye subscore is 4. GCS verbal subscore is 5. GCS motor subscore is 6.  No numbness, weakness, or tingling in legs.   Skin: Capillary refill takes less than 2 seconds. No rash noted. She is not diaphoretic. No erythema.  Psychiatric: She has a normal mood and affect.  Nursing note and vitals reviewed.    ED Treatments / Results  Labs (all labs ordered are listed, but only abnormal results are displayed) Labs Reviewed  CBC WITH DIFFERENTIAL/PLATELET -  Abnormal; Notable for the following:       Result Value   WBC 12.9 (*)    Hemoglobin 11.9 (*)    Neutro Abs 9.7 (*)    Monocytes Absolute 1.7 (*)    All other components within normal limits  COMPREHENSIVE METABOLIC PANEL - Abnormal; Notable for the following:    BUN 28 (*)    Creatinine, Ser 1.06 (*)    Calcium 8.6 (*)    Total Protein 6.0 (*)    Albumin 3.4 (*)    ALT 13 (*)    Total Bilirubin 2.1 (*)    GFR calc non Af Amer 42 (*)    GFR calc Af Amer 49 (*)    All other components within normal limits  URINALYSIS, ROUTINE W REFLEX MICROSCOPIC - Abnormal; Notable for the following:    APPearance HAZY (*)    Hgb urine dipstick SMALL (*)    Ketones, ur 20 (*)    Protein, ur 30 (*)    Leukocytes, UA MODERATE (*)    Bacteria, UA RARE (*)    Squamous Epithelial / LPF 6-30 (*)    Non Squamous Epithelial 0-5 (*)    All other components within normal limits  URINE CULTURE  LIPASE, BLOOD    EKG  EKG Interpretation None       Radiology Ct Lumbar Spine Wo Contrast  Result Date: 04/17/2017 CLINICAL DATA:  Severe back pain. Fall 2 weeks ago with left wrist fracture. Back pain is getting  worse for 4 days. EXAM: CT LUMBAR SPINE WITHOUT CONTRAST TECHNIQUE: Multidetector CT imaging of the lumbar spine was performed without intravenous contrast administration. Multiplanar CT image reconstructions were also generated. COMPARISON:  None. FINDINGS: Segmentation: Standard Alignment: Moderate levoscoliosis. Mild L1-2 and L2-3 retrolisthesis. Vertebrae: Negative for acute fracture. There is a chronic appearing T12 superior endplate fracture. Paraspinal and other soft tissues: Bilateral renal hilar calcifications that are primarily elongated and appear vascular. Partly seen right upper pole renal cyst. Atherosclerotic calcification of the aorta. Minimally seen sigmoid colon but still showing multiple diverticula. Disc levels: T12- L1: Disc degeneration with vacuum phenomenon. No herniation or impingement L1-L2: Advanced asymmetric rightward disc narrowing with far-lateral predominant endplate spurring. Vacuum phenomenon. Negative facets. Bilateral foraminal stenosis with high-grade impingement on the right. Patent spinal canal L2-L3: Advanced asymmetric rightward disc narrowing with bulky far-lateral spurring. High-grade right foraminal stenosis. Patent spinal canal. L3-L4: Disc degeneration with vacuum phenomenon. Interspinous degeneration with spinous process spurring. The disc is bulging. No impingement noted. L4-L5: Disc narrowing and circumferential bulging. Asymmetric left facet spurring. Ligamentum flavum thickening and interspinous degeneration. High-grade left foraminal impingement. Moderate triangular narrowing of the thecal sac. L5-S1:Advanced asymmetric left facet arthropathy. The disc is narrowed and bulging. Asymmetric left foraminal impingement due to disc bulge. IMPRESSION: 1. No acute finding. 2. Scoliosis with multilevel advanced disc degeneration and facet arthropathy. 3. L1-2 and L2-3 right foraminal impingement. 4. L4-5 moderate spinal stenosis. 5. L4-5 and L5-S1 left foraminal  impingement. Electronically Signed   By: Marnee Spring M.D.   On: 04/17/2017 16:44    Procedures Procedures (including critical care time)  Medications Ordered in ED Medications  sodium chloride 0.9 % bolus 1,000 mL (0 mLs Intravenous Stopped 04/17/17 1709)  fosfomycin (MONUROL) packet 3 g (3 g Oral Given 04/17/17 2328)  cyclobenzaprine (FLEXERIL) tablet 2.5 mg (2.5 mg Oral Given 04/17/17 2328)     Initial Impression / Assessment and Plan / ED Course  I have reviewed the triage vital signs  and the nursing notes.  Pertinent labs & imaging results that were available during my care of the patient were reviewed by me and considered in my medical decision making (see chart for details).     WILLAMINA GRIESHOP is a 83 y.o. female with a past medical history significant for hypertension, strokes, CAD with MI, thyroid disease, hyperlipidemia, and recent fall 2 weeks ago with left wrist fracture who presents with diarrhea and low back pain. Patient reports that since her fall, she was taking antibiotics for the open fracture of her wrist. Patient is currently in a splint. Patient reports having severe diarrhea ever since starting the antibiotic. Patient says she is feeling dehydrated and her mouth is dry. She reports some fatigue but denies any syncopal episodes. She says that over the last several she has developed severe low back pain. She describes the pain as sharp and "spasming". She describes the pain as 10 out of 10 and all across her low back. She reports that she did not have significant pain during the fall and she denies any new traumas. She denies numbness, tingling, weakness of legs. She denies loss of bowel or bladder function, just the diarrhea. She reports some urinary frequency.She denies any chest pain or abdominal pain or any upper back pain. She denies other components.  On exam, patient had very mild pain and tenderness in her low back. Patient had normal leg strength and sensation  are normal pulses in lower extremity. No CVA tenderness. Lungs clear. Chest nontender. No focal neurologic deficits seen.  Patient will have CT imaging of the low back as she may have had distracting injury from her wrist fracture. Suspect the diarrhea is secondary to the antibiotic use for the open fracture however, will have laboratory testing to look for electrolyte imbalances or dehydration. Patient will be given fluids during initial workup.Anticipate reassessment following workup.  Patient's diagnostic workup results are seen above. Mild leukocytosis and anemia. Metabolic panel showed slight elevation in creatinine of 1.06. Lipase not elevated. Urinalysis showed leukocytes and bacteria and white blood cells. Patient also found to have ketones and protein in the urine. Given the patient's urinary frequency and the findings, patient will be treated for UTI. As patient was just on Keflex, she'll be given a dose of fosfomycin here. Do not feel patient has pyelonephritis. Patient's back pain was in the very low back and not in the CVA areas. Patient had no nausea or vomiting.  Patient and family were asking for a muscle spasm medicine. Patient advised that given her age and the falls, it was not advised to take her normal doses however, a very small dose of Flexeril will be provided. If patient is able to ambulate safely, we'll consider providing her  A prescription for several doses and instructions to follow-up with her PCP.Patient's CT imaging did not show evidence of acute injury or fracture. Patient has degenerative disease.  Patient will be treated for both UTI and the muscle spasms of the back. Anticipate discharge after meds.     Final Clinical Impressions(s) / ED Diagnoses   Final diagnoses:  Acute bilateral low back pain without sciatica  Muscle spasm of back  Acute cystitis without hematuria  Diarrhea of infectious origin    New Prescriptions New Prescriptions   CYCLOBENZAPRINE  (FLEXERIL) 5 MG TABLET    Take 0.5 tablets (2.5 mg total) by mouth 2 (two) times daily as needed for muscle spasms.    Clinical Impression: 1. Acute bilateral low  back pain without sciatica   2. Muscle spasm of back   3. Acute cystitis without hematuria   4. Diarrhea of infectious origin     Disposition: Discharge  Condition: Good  I have discussed the results, Dx and Tx plan with the pt(& family if present). He/she/they expressed understanding and agree(s) with the plan. Discharge instructions discussed at great length. Strict return precautions discussed and pt &/or family have verbalized understanding of the instructions. No further questions at time of discharge.    New Prescriptions   CYCLOBENZAPRINE (FLEXERIL) 5 MG TABLET    Take 0.5 tablets (2.5 mg total) by mouth 2 (two) times daily as needed for muscle spasms.    Follow Up: Burton Apley, MD [address] [place] [address] [place] [address] [phone]  Schedule an appointment as soon as possible for a visit    [facility] [address] [id number] mc [place] [address] [phone]  If symptoms worsen     Valisha Heslin, Canary Brim, MD 04/18/17 [phone]

## 2017-04-17 NOTE — ED Notes (Signed)
Noted cast on left arm

## 2017-04-17 NOTE — ED Triage Notes (Signed)
Per EMS, patient comes from home. She lives alone, but has home health come every day. The neighbor called EMS as patient was c/o severe back pain. PT fell 2 weeks ago and broke L wrist (in splint). Back pain started getting worse 4 days ago, worse with movement. No deformity or brusing noted. Also c/o diarrhea. Patient is alert and at her baseline per neighbor. EMS called patients daughter but they do not have a good relationship, patient states she makes her own decisions.

## 2017-04-18 MED ORDER — CYCLOBENZAPRINE HCL 5 MG PO TABS: 2.5000 mg | ORAL_TABLET | Freq: Two times a day (BID) | ORAL | 0 refills | Status: DC | PRN

## 2017-04-18 NOTE — ED Notes (Signed)
[facility] called for transport.  

## 2017-04-18 NOTE — Discharge Instructions (Signed)
Your workup today showed evidence of urinary tract infection. We gave you a 1 time antibiotic to treat the urinary tract infection. We suspect your diarrhea is due to the antibiotic she took for your hand injury over the last 2 weeks. Your CT images of your back did not show acute fractures. We found degenerative changes.We suspect your pain are due to muscle spasms after your fall. He received a very small dose of muscle relaxant and will be given a prescription for this. We do not want you to fall again but wanted to help provide a medicine with your spasms. Please be careful and follow-up with your primary care physician in the next several days. If any symptoms change or worsen, please return to the nearest emergency department.

## 2017-04-18 NOTE — ED Notes (Signed)
Bed: [facility] Expected date:  Expected time:  Means of arrival:  Comments: 

## 2017-04-19 ENCOUNTER — Ambulatory Visit: Payer: Medicare Other | Admitting: Nurse Practitioner

## 2017-04-19 LAB — URINE CULTURE
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
## 2017-04-20 ENCOUNTER — Inpatient Hospital Stay (HOSPITAL_COMMUNITY)
Admission: EM | Admit: 2017-04-20 | Discharge: 2017-04-24 | DRG: 372 | Disposition: A | Discharge status: Skilled Nursing Facility | Payer: Medicare Other | Attending: Internal Medicine | Admitting: Internal Medicine

## 2017-04-20 ENCOUNTER — Observation Stay (HOSPITAL_COMMUNITY): Payer: Medicare Other

## 2017-04-20 ENCOUNTER — Encounter (HOSPITAL_COMMUNITY): Payer: Self-pay

## 2017-04-20 DIAGNOSIS — R17 Unspecified jaundice: Secondary | ICD-10-CM

## 2017-04-20 DIAGNOSIS — E785 Hyperlipidemia, unspecified: Secondary | ICD-10-CM | POA: Diagnosis present

## 2017-04-20 DIAGNOSIS — A0472 Enterocolitis due to Clostridium difficile, not specified as recurrent: Principal | ICD-10-CM | POA: Diagnosis present

## 2017-04-20 DIAGNOSIS — S52572D Other intraarticular fracture of lower end of left radius, subsequent encounter for closed fracture with routine healing: Secondary | ICD-10-CM

## 2017-04-20 DIAGNOSIS — R197 Diarrhea, unspecified: Secondary | ICD-10-CM | POA: Diagnosis not present

## 2017-04-20 DIAGNOSIS — I110 Hypertensive heart disease with heart failure: Secondary | ICD-10-CM | POA: Diagnosis present

## 2017-04-20 DIAGNOSIS — K8071 Calculus of gallbladder and bile duct without cholecystitis with obstruction: Secondary | ICD-10-CM

## 2017-04-20 DIAGNOSIS — E876 Hypokalemia: Secondary | ICD-10-CM

## 2017-04-20 DIAGNOSIS — I255 Ischemic cardiomyopathy: Secondary | ICD-10-CM | POA: Diagnosis present

## 2017-04-20 DIAGNOSIS — I5022 Chronic systolic (congestive) heart failure: Secondary | ICD-10-CM | POA: Diagnosis not present

## 2017-04-20 DIAGNOSIS — I5020 Unspecified systolic (congestive) heart failure: Secondary | ICD-10-CM

## 2017-04-20 DIAGNOSIS — I4891 Unspecified atrial fibrillation: Secondary | ICD-10-CM | POA: Diagnosis present

## 2017-04-20 DIAGNOSIS — Z66 Do not resuscitate: Secondary | ICD-10-CM | POA: Diagnosis present

## 2017-04-20 DIAGNOSIS — N390 Urinary tract infection, site not specified: Secondary | ICD-10-CM | POA: Diagnosis present

## 2017-04-20 DIAGNOSIS — K529 Noninfective gastroenteritis and colitis, unspecified: Secondary | ICD-10-CM | POA: Diagnosis not present

## 2017-04-20 DIAGNOSIS — Z8674 Personal history of sudden cardiac arrest: Secondary | ICD-10-CM

## 2017-04-20 DIAGNOSIS — W19XXXD Unspecified fall, subsequent encounter: Secondary | ICD-10-CM | POA: Diagnosis present

## 2017-04-20 DIAGNOSIS — R531 Weakness: Secondary | ICD-10-CM

## 2017-04-20 DIAGNOSIS — E039 Hypothyroidism, unspecified: Secondary | ICD-10-CM | POA: Diagnosis not present

## 2017-04-20 DIAGNOSIS — I252 Old myocardial infarction: Secondary | ICD-10-CM

## 2017-04-20 DIAGNOSIS — Z87891 Personal history of nicotine dependence: Secondary | ICD-10-CM

## 2017-04-20 DIAGNOSIS — Z8249 Family history of ischemic heart disease and other diseases of the circulatory system: Secondary | ICD-10-CM

## 2017-04-20 DIAGNOSIS — Z8673 Personal history of transient ischemic attack (TIA), and cerebral infarction without residual deficits: Secondary | ICD-10-CM

## 2017-04-20 DIAGNOSIS — R410 Disorientation, unspecified: Secondary | ICD-10-CM | POA: Diagnosis present

## 2017-04-20 LAB — URINALYSIS, ROUTINE W REFLEX MICROSCOPIC
Bilirubin Urine: NEGATIVE
GLUCOSE, UA: NEGATIVE mg/dL
Hgb urine dipstick: NEGATIVE
KETONES UR: 5 mg/dL — AB
Nitrite: NEGATIVE
PROTEIN: 30 mg/dL — AB
Specific Gravity, Urine: 1.025 (ref 1.005–1.030)
pH: 5 (ref 5.0–8.0)

## 2017-04-20 LAB — CBC WITH DIFFERENTIAL/PLATELET
Basophils Absolute: 0 10*3/uL (ref 0.0–0.1)
Basophils Relative: 0 %
Eosinophils Absolute: 0.1 10*3/uL (ref 0.0–0.7)
Eosinophils Relative: 1 %
HEMATOCRIT: 40.3 % (ref 36.0–46.0)
HEMOGLOBIN: 13.4 g/dL (ref 12.0–15.0)
LYMPHS ABS: 1.7 10*3/uL (ref 0.7–4.0)
LYMPHS PCT: 11 %
MCH: 30.2 pg (ref 26.0–34.0)
MCHC: 33.3 g/dL (ref 30.0–36.0)
MCV: 90.8 fL (ref 78.0–100.0)
MONOS PCT: 13 %
Monocytes Absolute: 2 10*3/uL — ABNORMAL HIGH (ref 0.1–1.0)
NEUTROS ABS: 11.1 10*3/uL — AB (ref 1.7–7.7)
NEUTROS PCT: 75 %
Platelets: 183 10*3/uL (ref 150–400)
RBC: 4.44 MIL/uL (ref 3.87–5.11)
RDW: 14 % (ref 11.5–15.5)
WBC: 14.9 10*3/uL — ABNORMAL HIGH (ref 4.0–10.5)

## 2017-04-20 LAB — COMPREHENSIVE METABOLIC PANEL
ALK PHOS: 97 U/L (ref 38–126)
ALT: 25 U/L (ref 14–54)
ANION GAP: 11 (ref 5–15)
AST: 32 U/L (ref 15–41)
Albumin: 3.4 g/dL — ABNORMAL LOW (ref 3.5–5.0)
BILIRUBIN TOTAL: 1.8 mg/dL — AB (ref 0.3–1.2)
BUN: 37 mg/dL — ABNORMAL HIGH (ref 6–20)
CALCIUM: 8.9 mg/dL (ref 8.9–10.3)
CO2: 23 mmol/L (ref 22–32)
CREATININE: 1.12 mg/dL — AB (ref 0.44–1.00)
Chloride: 105 mmol/L (ref 101–111)
GFR calc non Af Amer: 40 mL/min — ABNORMAL LOW (ref >60)
GFR, EST AFRICAN AMERICAN: 46 mL/min — AB (ref >60)
GLUCOSE: 101 mg/dL — AB (ref 65–99)
Potassium: 3.3 mmol/L — ABNORMAL LOW (ref 3.5–5.1)
Sodium: 139 mmol/L (ref 135–145)
TOTAL PROTEIN: 6.1 g/dL — AB (ref 6.5–8.1)

## 2017-04-20 LAB — LACTIC ACID, PLASMA
LACTIC ACID, VENOUS: 1.4 mmol/L (ref 0.5–1.9)
LACTIC ACID, VENOUS: 2 mmol/L — AB (ref 0.5–1.9)
LACTIC ACID, VENOUS: 1.7 mmol/L (ref 0.5–1.9)
Lactic Acid, Venous: 1.1 mmol/L (ref 0.5–1.9)

## 2017-04-20 LAB — DIGOXIN LEVEL: DIGOXIN LVL: 0.8 ng/mL (ref 0.8–2.0)

## 2017-04-20 MED ORDER — ATORVASTATIN CALCIUM 10 MG PO TABS
10.0000 mg | ORAL_TABLET | Freq: Every day | ORAL | Status: DC
  Administered 2017-04-21 – 2017-04-24 (×4): 10 mg via ORAL
  Filled 2017-04-20 (×4): qty 1

## 2017-04-20 MED ORDER — LEVOTHYROXINE SODIUM 50 MCG PO TABS
50.0000 ug | ORAL_TABLET | Freq: Every day | ORAL | Status: DC
  Administered 2017-04-21 – 2017-04-24 (×4): 50 ug via ORAL
  Filled 2017-04-20 (×4): qty 1

## 2017-04-20 MED ORDER — SODIUM CHLORIDE 0.9 % IV SOLN: INTRAVENOUS | Status: AC

## 2017-04-20 MED ORDER — SODIUM CHLORIDE 0.9 % IV SOLN
Freq: Once | INTRAVENOUS | Status: AC
  Administered 2017-04-20: 10:00:00 via INTRAVENOUS

## 2017-04-20 MED ORDER — DIGOXIN 125 MCG PO TABS
0.1250 mg | ORAL_TABLET | Freq: Every day | ORAL | Status: DC
  Administered 2017-04-20 – 2017-04-24 (×5): 0.125 mg via ORAL
  Filled 2017-04-20 (×5): qty 1

## 2017-04-20 MED ORDER — ACETAMINOPHEN 500 MG PO TABS
500.0000 mg | ORAL_TABLET | Freq: Four times a day (QID) | ORAL | Status: DC | PRN
  Administered 2017-04-20: 500 mg via ORAL
  Filled 2017-04-20: qty 1

## 2017-04-20 MED ORDER — ENOXAPARIN SODIUM 30 MG/0.3ML ~~LOC~~ SOLN
30.0000 mg | SUBCUTANEOUS | Status: DC
  Administered 2017-04-20 – 2017-04-23 (×4): 30 mg via SUBCUTANEOUS
  Filled 2017-04-20 (×4): qty 0.3

## 2017-04-20 MED ORDER — IOPAMIDOL (ISOVUE-300) INJECTION 61%
80.0000 mL | Freq: Once | INTRAVENOUS | Status: AC | PRN
  Administered 2017-04-20: 80 mL via INTRAVENOUS

## 2017-04-20 MED ORDER — NITROGLYCERIN 0.2 MG/HR TD PT24
0.2000 mg | MEDICATED_PATCH | TRANSDERMAL | Status: DC
  Filled 2017-04-20: qty 1

## 2017-04-20 MED ORDER — POTASSIUM CHLORIDE CRYS ER 20 MEQ PO TBCR
40.0000 meq | EXTENDED_RELEASE_TABLET | Freq: Once | ORAL | Status: AC
  Administered 2017-04-20: 40 meq via ORAL
  Filled 2017-04-20: qty 2

## 2017-04-20 MED ORDER — DEXTROSE 5 % IV SOLN
2.0000 g | INTRAVENOUS | Status: DC
  Administered 2017-04-20 – 2017-04-23 (×4): 2 g via INTRAVENOUS
  Filled 2017-04-20 (×5): qty 2

## 2017-04-20 MED ORDER — POTASSIUM CHLORIDE CRYS ER 10 MEQ PO TBCR
10.0000 meq | EXTENDED_RELEASE_TABLET | Freq: Every day | ORAL | Status: DC
  Administered 2017-04-20 – 2017-04-24 (×5): 10 meq via ORAL
  Filled 2017-04-20 (×5): qty 1

## 2017-04-20 MED ORDER — BOOST / RESOURCE BREEZE PO LIQD
1.0000 | Freq: Three times a day (TID) | ORAL | Status: DC
  Administered 2017-04-20 – 2017-04-24 (×10): 1 via ORAL

## 2017-04-20 MED ORDER — METRONIDAZOLE IN NACL 5-0.79 MG/ML-% IV SOLN
500.0000 mg | Freq: Three times a day (TID) | INTRAVENOUS | Status: DC
  Administered 2017-04-20 – 2017-04-24 (×12): 500 mg via INTRAVENOUS
  Filled 2017-04-20 (×13): qty 100

## 2017-04-20 MED ORDER — IOPAMIDOL (ISOVUE-300) INJECTION 61%
INTRAVENOUS | Status: AC
  Filled 2017-04-20: qty 100

## 2017-04-20 MED ORDER — SODIUM CHLORIDE 0.9 % IV SOLN
INTRAVENOUS | Status: DC
  Administered 2017-04-20: 13:00:00 via INTRAVENOUS

## 2017-04-20 NOTE — ED Notes (Signed)
ED TO INPATIENT HANDOFF REPORT  Name/Age/Gender [name] 83 y.o. female  Code Status Code Status History    This patient does not have a recorded code status. Please follow your organizational policy for patients in this situation.    Advance Directive Documentation     Most Recent Value  Type of Advance Directive  Living will  Pre-existing out of facility DNR order (yellow form or pink MOST form)  -  "MOST" Form in Place?  -      Home/SNF/Other Home  Chief Complaint Diarrhea  Level of Care/Admitting Diagnosis ED Disposition    ED Disposition Condition [place]: [place] [100102]  Level of Care: Telemetry [5]  Admit to tele based on following criteria: Other see comments  Comments: history of HF with EF 25%, LBBB  Diagnosis: Diarrhea [787.91.ICD-9-CM]  Admitting Physician: Elodia Florence [phone]  Attending Physician: Cephus Slater, A CALDWELL [phone]  PT Class (Do Not Modify): Observation [104]  PT Acc Code (Do Not Modify): Observation [10022]       Medical History Past Medical History:  Diagnosis Date  . Advanced age   . Cardiac arrest [facility]) 2007   Occurred during planned ovarian surgery that resolved spontaneously after her stomach was deflated.   . CVA (cerebral infarction) Oct 2010   "light stroke". Plavix started.  . Grief    from husband's death  . Hyperlipemia   . Hypertension   . Hypothyroidism   . Ischemic cardiomyopathy June 2009   managed medically; EF is 20 to 25%  . Left bundle branch block   . Left heart failure (HCC)    EF is 20 to 25%; She is managed conservatively  . Stroke ([place])   . Subendocardial myocardial infarction [facility]) June 2009    Allergies Allergies  Allergen Reactions  . Procaine Hcl Hypertension    PASSED OUT    IV Location/Drains/Wounds Patient Lines/Drains/Airways Status   Active Line/Drains/Airways    Name:   Placement date:   Placement time:   Site:   Days:    Peripheral IV 04/20/17 Right Forearm  04/20/17    0934    Forearm    less than 1          Labs/Imaging Results for orders placed or performed during the hospital encounter of 04/20/17 (from the past 48 hour(s))  CBC with Differential/Platelet     Status: Abnormal   Collection Time: 04/20/17  9:28 AM  Result Value Ref Range   WBC 14.9 (H) 4.0 - 10.5 K/uL   RBC 4.44 3.87 - 5.11 MIL/uL   Hemoglobin 13.4 12.0 - 15.0 g/dL   HCT 40.3 36.0 - 46.0 %   MCV 90.8 78.0 - 100.0 fL   MCH 30.2 26.0 - 34.0 pg   MCHC 33.3 30.0 - 36.0 g/dL   RDW 14.0 11.5 - 15.5 %   Platelets 183 150 - 400 K/uL   Neutrophils Relative % 75 %   Neutro Abs 11.1 (H) 1.7 - 7.7 K/uL   Lymphocytes Relative 11 %   Lymphs Abs 1.7 0.7 - 4.0 K/uL   Monocytes Relative 13 %   Monocytes Absolute 2.0 (H) 0.1 - 1.0 K/uL   Eosinophils Relative 1 %   Eosinophils Absolute 0.1 0.0 - 0.7 K/uL   Basophils Relative 0 %   Basophils Absolute 0.0 0.0 - 0.1 K/uL  Comprehensive metabolic panel     Status: Abnormal   Collection Time: 04/20/17  9:28 AM  Result Value Ref Range   Sodium 139 135 - 145 mmol/L   Potassium 3.3 (L) 3.5 - 5.1 mmol/L   Chloride 105 101 - 111 mmol/L   CO2 23 22 - 32 mmol/L   Glucose, Bld 101 (H) 65 - 99 mg/dL   BUN 37 (H) 6 - 20 mg/dL   Creatinine, Ser 1.12 (H) 0.44 - 1.00 mg/dL   Calcium 8.9 8.9 - 10.3 mg/dL   Total Protein 6.1 (L) 6.5 - 8.1 g/dL   Albumin 3.4 (L) 3.5 - 5.0 g/dL   AST 32 15 - 41 U/L   ALT 25 14 - 54 U/L   Alkaline Phosphatase 97 38 - 126 U/L   Total Bilirubin 1.8 (H) 0.3 - 1.2 mg/dL   GFR calc non Af Amer 40 (L) >60 mL/min   GFR calc Af Amer 46 (L) >60 mL/min    Comment: (NOTE) The eGFR has been calculated using the CKD EPI equation. This calculation has not been validated in all clinical situations. eGFR's persistently <60 mL/min signify possible Chronic Kidney Disease.    Anion gap 11 5 - 15  Lactic acid, plasma     Status: None   Collection Time: 04/20/17  9:28 AM  Result  Value Ref Range   Lactic Acid, Venous 1.4 0.5 - 1.9 mmol/L  Urinalysis, Routine w reflex microscopic     Status: Abnormal   Collection Time: 04/20/17 11:20 AM  Result Value Ref Range   Color, Urine AMBER (A) YELLOW    Comment: BIOCHEMICALS MAY BE AFFECTED BY COLOR   APPearance HAZY (A) CLEAR   Specific Gravity, Urine 1.025 1.005 - 1.030   pH 5.0 5.0 - 8.0   Glucose, UA NEGATIVE NEGATIVE mg/dL   Hgb urine dipstick NEGATIVE NEGATIVE   Bilirubin Urine NEGATIVE NEGATIVE   Ketones, ur 5 (A) NEGATIVE mg/dL   Protein, ur 30 (A) NEGATIVE mg/dL   Nitrite NEGATIVE NEGATIVE   Leukocytes, UA SMALL (A) NEGATIVE   RBC / HPF 0-5 0 - 5 RBC/hpf   WBC, UA 6-30 0 - 5 WBC/hpf   Bacteria, UA FEW (A) NONE SEEN   Squamous Epithelial / LPF 6-30 (A) NONE SEEN   Mucus PRESENT    Hyaline Casts, UA PRESENT    Non Squamous Epithelial 0-5 (A) NONE SEEN  Digoxin level     Status: None   Collection Time: 04/20/17 12:31 PM  Result Value Ref Range   Digoxin Level 0.8 0.8 - 2.0 ng/mL   No results found.  Pending Labs [facility]     Ordered   04/20/17 1427  Culture, Urine  Add-on,   R     04/20/17 1426   04/20/17 0919  C difficile D'Arcy Abraha scan w PCR reflex  (C Difficile Zineb Glade screen w PCR reflex panel)  Once, for 48 hours,   R    Comments:  Laxatives (last 72 hours)   None     Question Answer Comment  Is your patient experiencing loose or watery stools (3 or more in 24 hours)? Yes   Has the patient received laxatives in the last 24 hours? No   Has a negative Cdiff test resulted in the last 7 days? No      04/20/17 0919   04/20/17 0918  Lactic acid, plasma  STAT Now then every 3 hours,   STAT     04/20/17 0919   Signed and Held  CBC  (enoxaparin (LOVENOX)  CrCl < 30 ml/min)  Once,   R    Comments:  Baseline for enoxaparin therapy IF NOT ALREADY DRAWN.  Notify MD if PLT < 100 K.    Signed and Held   Signed and Held  Creatinine, serum  (enoxaparin (LOVENOX)    CrCl < 30 ml/min)  Once,   R     Comments:  Baseline for enoxaparin therapy IF NOT ALREADY DRAWN.    Signed and Held   Signed and Held  Creatinine, serum  (enoxaparin (LOVENOX)    CrCl < 30 ml/min)  Weekly,   R    Comments:  while on enoxaparin therapy.    Signed and Held   Signed and Held  Comprehensive metabolic panel  Tomorrow morning,   R     Signed and Held   Signed and Held  CBC  Tomorrow morning,   R     Signed and Held      Vitals/Pain Today's Vitals   04/20/17 1200 04/20/17 1245 04/20/17 1315 04/20/17 1515  BP: (!) 115/51 131/63 132/63 (!) 136/59  Pulse: (!) 36 80 78 78  Resp:  (!) _0 Temp:      TempSrc:      SpO2: 97% 97% 96% 97%    Isolation Precautions Enteric precautions (UV disinfection)  Medications Medications  0.9 %  sodium chloride infusion ( Intravenous New Bag/Given 04/20/17 1230)  iopamidol (ISOVUE-300) 61 % injection (not administered)  0.9 %  sodium chloride infusion ( Intravenous Stopped 04/20/17 1229)    Mobility walks with person assist

## 2017-04-20 NOTE — ED Notes (Signed)
Bed: WA16 Expected date:  Expected time:  Means of arrival:  Comments: ems 

## 2017-04-20 NOTE — H&P (Addendum)
History and Physical    [name]:[id number] DOB: 1917/04/03 DOA: 04/20/2017  PCP: Burton Apley, MD   Patient coming from: home  I have personally briefly reviewed patient's old medical records in [facility]  Chief Complaint: diarrhea  HPI: [name] is [name] 83 y.o. female with medical history significant of HF (EF 20-25%), HTN, Hypothyroidism, recent L wrist fracture, presenting with diarrhea.    Patient notes that she's had diarrhea off and on for about Jansen Goodpasture month. She describes it as brown, no blood. Usually more than twice [name] day. She denies any abdominal pain, but sometimes has some soreness to her right lower quadrant that she describes bothersome. She denies any nausea or vomiting. She denies any chest pain or shortness of breath. She denies any fevers or chills. She denies any dysuria frequency or urgency. She generally feels weaker than normal over the past month.   Her daughter notes that she is recently looked "washed out." she notes that she's had diarrhea for [name] long time.  She's had decreased appetite for the past week per her daughter. She also notes that she's been more confused lately. Yesterday she saw her PCP who recommended going to the hospital.  She lives at home alone and her neighbors help her out. Her daughter also helps at home. She uses [name] cane and walker. She's fallen [name] few times in the past month. She'll drive [name] couple blocks.   ED Course:  Labs, urine, EKG, call for an for diarrhea and difficulty getting to bedside commode.  Review of Systems: As per HPI otherwise 10 point review of systems negative.   Past Medical History:  Diagnosis Date  . Advanced age   . Cardiac arrest [facility]) 2007   Occurred during planned ovarian surgery that resolved spontaneously after her stomach was deflated.   . CVA (cerebral infarction) Oct 2010   "light stroke". Plavix started.  . Grief    from husband's death  . Hyperlipemia   . Hypertension   .  Hypothyroidism   . Ischemic cardiomyopathy June 2009   managed medically; EF is 20 to 25%  . Left bundle branch block   . Left heart failure (HCC)    EF is 20 to 25%; She is managed conservatively  . Stroke (HCC)   . Subendocardial myocardial infarction [facility]) June 2009    Past Surgical History:  Procedure Laterality Date  . CATARACT EXTRACTION  09/18/2012   right eye  . DILATION AND CURETTAGE OF UTERUS    . OVARIAN CYST REMOVAL     2007-2008     reports that she quit smoking about 48 years ago. She has never used smokeless tobacco. She reports that she does not drink alcohol or use drugs.  Allergies  Allergen Reactions  . Procaine Hcl Hypertension    PASSED OUT    Family History  Problem Relation Age of Onset  . Aneurysm Father        ruptured  . Heart attack Mother   . Heart defect Mother        enlarged heart   Prior to Admission medications   Medication Sig Start Date End Date Taking? Authorizing Provider  acetaminophen (TYLENOL) 500 MG tablet Take 500 mg by mouth every 6 (six) hours as needed for mild pain, moderate pain, fever or headache.   Yes [provider]  atorvastatin (LIPITOR) 10 MG tablet Take 10 mg by mouth daily. 02/06/17  Yes [provider]  cholecalciferol (VITAMIN  D) 1000 units tablet Take 1,000 Units by mouth daily.   Yes [provider]  digoxin (DIGITEK) 0.125 MG tablet TAKE 1 TABLET (0.125 MG TOTAL) BY MOUTH DAILY. 10/29/15  Yes Rollene Rotunda, MD  diphenoxylate-atropine (LOMOTIL) 2.5-0.025 MG tablet Take 1 tablet by mouth 2 (two) times daily. 04/10/17  Yes [provider]  furosemide (LASIX) 20 MG tablet Take 1 tablet (20 mg total) by mouth daily. 09/16/15  Yes Rosalio Macadamia, NP  Multiple Vitamins-Minerals (CENTRUM SILVER PO) Take 1 tablet by mouth daily.   Yes [provider]  nitroGLYCERIN (NITRODUR - DOSED IN MG/24 HR) 0.2 mg/hr patch PLACE ONE PATCH ONTO SKIN ONCE DAILY AT BEDTIME 01/27/15  Yes  Hochrein, Fayrene Fearing, MD  nitroGLYCERIN (NITROSTAT) 0.4 MG SL tablet Place 1 tablet (0.4 mg total) under the tongue every 5 (five) minutes as needed for chest pain. 01/29/16  Yes Rosalio Macadamia, NP  potassium chloride (K-DUR) 10 MEQ tablet TAKE 1 TABLET (10 MEQ TOTAL) BY MOUTH DAILY. 07/01/15  Yes Rosalio Macadamia, NP  SYNTHROID 50 MCG tablet Take 50 mcg by mouth daily. 03/21/17  Yes [provider]  cephALEXin (KEFLEX) 500 MG capsule Take 1 capsule (500 mg total) by mouth 3 (three) times daily. Patient not taking: Reported on 04/17/2017 04/05/17   Benjiman Core, MD  cyclobenzaprine (FLEXERIL) 5 MG tablet Take 0.5 tablets (2.5 mg total) by mouth 2 (two) times daily as needed for muscle spasms. Patient not taking: Reported on 04/20/2017 04/18/17   Tegeler, Canary Brim, MD    Physical Exam: Vitals:   04/20/17 1245 04/20/17 1315 04/20/17 1515 04/20/17 1630  BP: 131/63 132/63 (!) 136/59 133/64  Pulse: 80 78 78 78  Resp: (!) (!) 22  Temp:    (!) 97.5 F (36.4 C)  TempSrc:    Oral  SpO2: 97% 96% 97% 97%    Constitutional: NAD, calm, comfortable Vitals:   04/20/17 1245 04/20/17 1315 04/20/17 1515 04/20/17 1630  BP: 131/63 132/63 (!) 136/59 133/64  Pulse: 80 78 78 78  Resp: (!) (!) 22  Temp:    (!) 97.5 F (36.4 C)  TempSrc:    Oral  SpO2: 97% 96% 97% 97%   NAD Eyes: PERRL, lids and conjunctivae normal ENMT: Mucous membranes are moist. Posterior pharynx clear of any exudate or lesions. Poor dentition.  Neck: normal, supple, no masses, no thyromegaly Respiratory: clear to auscultation bilaterally, no wheezing, no crackles. Normal respiratory effort. No accessory muscle use.  Cardiovascular: Regular rate and rhythm, no murmurs / rubs / gallops. No extremity edema. 2+ pedal pulses.  Abdomen: TTP most in RLQ, no masses palpated. No hepatosplenomegaly. Bowel sounds positive.  Musculoskeletal: no clubbing / cyanosis. No joint deformity upper and lower extremities.  Good ROM, no contractures. Normal muscle tone.  L arm wrapped.   Skin: L wrist wound healing well (some yellowish discharge that I suspect is due to topical bacitracin).   Neurologic: CN 2-12 grossly intact. Sensation intact. Moving all extremities. Psychiatric: Normal judgment and insight. Alert and oriented x 2 (did not know month). Normal mood.   Labs on Admission: I have personally reviewed following labs and imaging studies  CBC:  Recent Labs Lab 04/17/17 1558 04/20/17 0928  WBC 12.9* 14.9*  NEUTROABS 9.7* 11.1*  HGB 11.9* 13.4  HCT 36.3 40.3  MCV 92.6 90.8  PLT 156 183   Basic Metabolic Panel:  Recent Labs Lab 04/17/17 1558 04/20/17 0928  NA 138 139  K 3.5 3.3*  CL 103 105  CO2 26 23  GLUCOSE 92 101*  BUN 28* 37*  CREATININE 1.06* 1.12*  CALCIUM 8.6* 8.9   GFR: Estimated Creatinine Clearance: 26.1 mL/min (Gaynell Eggleton) (by C-G formula based on SCr of 1.12 mg/dL (H)). Liver Function Tests:  Recent Labs Lab 04/17/17 1558 04/20/17 0928  AST 21 32  ALT 13* 25  ALKPHOS 59 97  BILITOT 2.1* 1.8*  PROT 6.0* 6.1*  ALBUMIN 3.4* 3.4*    Recent Labs Lab 04/17/17 1558  LIPASE 29   No results for input(s): AMMONIA in the last 168 hours. Coagulation Profile: No results for input(s): INR, PROTIME in the last 168 hours. Cardiac Enzymes: No results for input(s): CKTOTAL, CKMB, CKMBINDEX, TROPONINI in the last 168 hours. BNP (last 3 results) No results for input(s): PROBNP in the last 8760 hours. HbA1C: No results for input(s): HGBA1C in the last 72 hours. CBG: No results for input(s): GLUCAP in the last 168 hours. Lipid Profile: No results for input(s): CHOL, HDL, LDLCALC, TRIG, CHOLHDL, LDLDIRECT in the last 72 hours. Thyroid Function Tests: No results for input(s): TSH, T4TOTAL, FREET4, T3FREE, THYROIDAB in the last 72 hours. Anemia Panel: No results for input(s): VITAMINB12, FOLATE, FERRITIN, TIBC, IRON, RETICCTPCT in the last 72 hours. Urine analysis:      Component Value Date/Time   COLORURINE AMBER (Logyn Dedominicis) 04/20/2017 1120   APPEARANCEUR HAZY (Zuleika Gallus) 04/20/2017 1120   LABSPEC 1.025 04/20/2017 1120   PHURINE 5.0 04/20/2017 1120   GLUCOSEU NEGATIVE 04/20/2017 1120   HGBUR NEGATIVE 04/20/2017 1120   BILIRUBINUR NEGATIVE 04/20/2017 1120   KETONESUR 5 (Briyanna Billingham) 04/20/2017 1120   PROTEINUR 30 (Farrel Guimond) 04/20/2017 1120   UROBILINOGEN 0.2 04/10/2014 0745   NITRITE NEGATIVE 04/20/2017 1120   LEUKOCYTESUR SMALL (Ashrita Chrismer) 04/20/2017 1120    Radiological Exams on Admission: Ct Abdomen Pelvis W Contrast  Result Date: 04/20/2017 CLINICAL DATA:  Diarrhea. EXAM: CT ABDOMEN AND PELVIS WITH CONTRAST TECHNIQUE: Multidetector CT imaging of the abdomen and pelvis was performed using the standard protocol following bolus administration of intravenous contrast. CONTRAST:  80mL ISOVUE-300 IOPAMIDOL (ISOVUE-300) INJECTION 61% COMPARISON:  None. FINDINGS: Lower chest:  Basilar atelectasis and/or scarring evident. Hepatobiliary: No focal abnormality within the liver parenchyma. Mild intrahepatic biliary duct dilatation is associated with dilatation of the extrahepatic common bile duct measuring up to 14 mm diameter in the head of pancreas. No ductal stone is evident by CT. Pancreas: Diffuse parenchymal atrophy without dilatation of the main duct. Spleen: No splenomegaly. No focal mass lesion. Adrenals/Urinary Tract: No adrenal nodule or mass. Vascular calcification noted in the hilum of each kidney. 3.7 cm cyst identified upper pole right kidney. Small cysts in the left kidney measure up to about 14 mm. No evidence for hydroureter. The urinary bladder appears normal for the degree of distention. Stomach/Bowel: Moderate hiatal hernia with about 50% of the stomach in the chest. Duodenum is normally positioned as is the ligament of Treitz. No small bowel wall thickening. No small bowel dilatation. The terminal ileum is normal. The appendix is not visualized. There is diffuse edema and wall thickening  in the colon, most prominent in the ascending and left segments. Associated diverticulosis noted in the sigmoid colon. Areas of pericolonic edema/inflammation associated. Vascular/Lymphatic: There is abdominal aortic atherosclerosis without aneurysm. There is no gastrohepatic or hepatoduodenal ligament lymphadenopathy. No intraperitoneal or retroperitoneal lymphadenopathy. No pelvic sidewall lymphadenopathy. Reproductive: The uterus has normal CT imaging appearance. There is no adnexal mass. Other: Small volume intraperitoneal free fluid noted  in the para colic gutters and pelvis. Musculoskeletal: Khair Chasteen small right groin hernia contains only fat. Bone windows reveal no worrisome lytic or sclerotic osseous lesions. Degenerative disc disease is most notable at T12-L1, L1-2, and L2-3. IMPRESSION: 1. Colonic wall edema and thickening diffusely but most evident in the right and left segments. Imaging features are compatible with an infectious/inflammatory colitis. 2. Fairly dramatic distention of the gallbladder with intra and extrahepatic biliary duct dilatation. The common bile duct remains dilated to the level of the ampulla. No evidence for common bile duct stone by CT, but MRCP or ERCP may prove helpful to further evaluate, specially in the setting of abnormal LFTs. 3.  Aortic Atherosclerois (ICD10-170.0) 4. Small right groin hernia contains only fat. Electronically Signed   By: Kennith Center M.D.   On: 04/20/2017 16:22    EKG: Independently reviewed. Compared with priors, LBBB and 1 degree AV block  Assessment/Plan Principal Problem:   Colitis Active Problems:   Hypothyroidism   Diarrhea   HFrEF (heart failure with reduced ejection fraction) (HCC)   Hypokalemia   Elevated bilirubin  Colitis  Abdominal Pain  Diarrhea:  CT notable for colonic wall edema and diffuse thickening. Findings compatible with infectious versus inflammatory colitis. Started ceftriaxone and Flagyl. She has pending C. Difficile (at  risk with recent abx for L wrist fx) and GI pathogen panel studies.   Clear liquid diet for now APAP for pain (not in significant pain other than with exam) Ceftriaxone/flagyl F/u stool studies prior to restart lomotil  Leukocytosis  Elevated lactate:  Sec. to above. Lactate initially normal, but now slightly elevated. She is receiving gentle hydration with 100 mL of normal saline an hour given her history of heart failure. At this time will continue in follow-up repeat lactate.  HR and BP are stable.   blood cx, urine cx  lactate  Delirium: daughter notes mental status waxing/waning recently.  Karmine Kauer&Ox2 for me today.  Likely due to above.  Delirium prec, ctm.   HFrEF: echo 2012 with EF 25-30%, diastolic dysfunction.  Euvolemic on presentation.  Continue to monitor.  Careful with IVF. Digoxin Holding lasix Nitroglycerin patch  Pyuria: recently treated with fosfomycin at previous ED visit.  Also on was on keflex for wrist fracture.  Asx.      f/u cx  Elevated bilirubin  Gallbladder distention: Elevated bili looks to be chronic.  Mildly elevated to 1.8.  CT with "dramatic" distention.   MRCP  L wrist fracture:  Suspected open at presentation on 9/26.  Followed up with Dr. Izora Ribas.  She was on keflex for this.  Wound currently looks pretty good.  Did have some discharge that was yellowish in color, but I think this is probably from the bacitracin and less likely infected wound.  Daughter was changing dressing daily with saline/hibiclens/bacitracin.  Will get rec from wound care.   Hypokalemia: replete  Hypothyroidism: home synthroid  Continue home atorva  PT/OT for deconditioning and d/c planning  DVT prophylaxis: lovenox  Code Status: DNR  Family Communication: discussed with daughter  Disposition Plan: d/c pending improvement, possibly home vs SNF Consults called: none  Admission status: tele    Lacretia Nicks MD [facility] [phone]   If 7PM-7AM,  please contact night-coverage [email or web address] Password [facility]  04/20/2017, 6:47 PM

## 2017-04-20 NOTE — ED Notes (Signed)
Commode bedside.

## 2017-04-20 NOTE — ED Notes (Signed)
Pt unable to provide stool sample at this time. Pt's brief is clean.

## 2017-04-20 NOTE — ED Notes (Signed)
Call report to [facility] [phone] 

## 2017-04-20 NOTE — ED Provider Notes (Signed)
[facility]-EMERGENCY DEPT Provider Note   CSN: [id number] Arrival date & time: 04/20/17  0848     History   Chief Complaint Chief Complaint  Patient presents with  . Diarrhea    HPI [name] is a 83 y.o. female. Chief complaint is diarrhea, profound weakness.  HPI:  83 year old female. She lives independently in her own home. She has 2 sisters that are local to check on her frequently. She was here 2-3 days ago with some back pain. Assuring exam. She was able to be discharged home. She reports intermittent diarrhea for "a few weeks". About 2 weeks ago she had a fracture of her wrist with a large skin tear was placed on Keflex for this for 7 days. Urine showed signs of infection 2 days ago and treated with single dose fosfomycin.  Urine culture of that urine shows multiple species. States he's had diarrhea increasing over the last 24 hours and several episodes during the night. She was weak at home this morning. She did not syncope. She has not seen blood in her stool. Has not had a stool since arriving here. Did take Lomotil this morning.  Patient has history of ischemic cardiomyopathy and an EF of 20-25. Does not take diuretics. Has nitroglycerin patch. Takes digoxin.  Past Medical History:  Diagnosis Date  . Advanced age   . Cardiac arrest [facility]) 2007   Occurred during planned ovarian surgery that resolved spontaneously after her stomach was deflated.   . CVA (cerebral infarction) Oct 2010   "light stroke". Plavix started.  . Grief    from husband's death  . Hyperlipemia   . Hypertension   . Hypothyroidism   . Ischemic cardiomyopathy June 2009   managed medically; EF is 20 to 25%  . Left bundle branch block   . Left heart failure (HCC)    EF is 20 to 25%; She is managed conservatively  . Stroke (HCC)   . 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 CVA (cerebral infarction) 08/04/2011  . Ischemic  cardiomyopathy   . Hypothyroidism   . Hyperlipemia     Past Surgical History:  Procedure Laterality Date  . CATARACT EXTRACTION  09/18/2012   right eye  . DILATION AND CURETTAGE OF UTERUS    . OVARIAN CYST REMOVAL     2007-2008    OB History    No data available       Home Medications    Prior to Admission medications   Medication Sig Start Date End Date Taking? Authorizing Provider  acetaminophen (TYLENOL) 500 MG tablet Take 500 mg by mouth every 6 (six) hours as needed for mild pain, moderate pain, fever or headache.   Yes [provider]  atorvastatin (LIPITOR) 10 MG tablet Take 10 mg by mouth daily. 02/06/17  Yes [provider]  cholecalciferol (VITAMIN D) 1000 units tablet Take 1,000 Units by mouth daily.   Yes [provider]  digoxin (DIGITEK) 0.125 MG tablet TAKE 1 TABLET (0.125 MG TOTAL) BY MOUTH DAILY. 10/29/15  Yes Rollene Rotunda, MD  diphenoxylate-atropine (LOMOTIL) 2.5-0.025 MG tablet Take 1 tablet by mouth 2 (two) times daily. 04/10/17  Yes [provider]  furosemide (LASIX) 20 MG tablet Take 1 tablet (20 mg total) by mouth daily. 09/16/15  Yes Rosalio Macadamia, NP  Multiple Vitamins-Minerals (CENTRUM SILVER PO) Take 1 tablet by mouth daily.   Yes [provider]  nitroGLYCERIN (  NITRODUR - DOSED IN MG/24 HR) 0.2 mg/hr patch PLACE ONE PATCH ONTO SKIN ONCE DAILY AT BEDTIME 01/27/15  Yes Hochrein, Fayrene Fearing, MD  nitroGLYCERIN (NITROSTAT) 0.4 MG SL tablet Place 1 tablet (0.4 mg total) under the tongue every 5 (five) minutes as needed for chest pain. 01/29/16  Yes Rosalio Macadamia, NP  potassium chloride (K-DUR) 10 MEQ tablet TAKE 1 TABLET (10 MEQ TOTAL) BY MOUTH DAILY. 07/01/15  Yes Rosalio Macadamia, NP  SYNTHROID 50 MCG tablet Take 50 mcg by mouth daily. 03/21/17  Yes [provider]  cephALEXin (KEFLEX) 500 MG capsule Take 1 capsule (500 mg total) by mouth 3 (three) times daily. Patient not taking: Reported on 04/17/2017  04/05/17   Benjiman Core, MD  cyclobenzaprine (FLEXERIL) 5 MG tablet Take 0.5 tablets (2.5 mg total) by mouth 2 (two) times daily as needed for muscle spasms. Patient not taking: Reported on 04/20/2017 04/18/17   Tegeler, Canary Brim, MD    Family History Family History  Problem Relation Age of Onset  . Aneurysm Father        ruptured  . Heart attack Mother   . Heart defect Mother        enlarged heart    Social History Social History  Substance Use Topics  . Smoking status: Former Smoker    Quit date: 07/11/1968  . Smokeless tobacco: Never Used  . Alcohol use No     Allergies   Procaine hcl   Review of Systems Review of Systems  Constitutional: Negative for appetite change, chills, diaphoresis, fatigue and fever.  HENT: Negative for mouth sores, sore throat and trouble swallowing.   Eyes: Negative for visual disturbance.  Respiratory: Negative for cough, chest tightness, shortness of breath and wheezing.   Cardiovascular: Negative for chest pain.  Gastrointestinal: Positive for diarrhea. Negative for abdominal distention, abdominal pain, blood in stool, nausea and vomiting.  Endocrine: Negative for polydipsia, polyphagia and polyuria.  Genitourinary: Negative for dysuria, frequency and hematuria.  Musculoskeletal: Negative for gait problem.  Skin: Negative for color change, pallor and rash.  Neurological: Positive for weakness. Negative for dizziness, syncope, light-headedness and headaches.  Hematological: Does not bruise/bleed easily.  Psychiatric/Behavioral: Negative for behavioral problems and confusion.     Physical Exam Updated Vital Signs BP 112/62   Pulse 74   Temp 97.9 F (36.6 C) (Oral)   Resp 15   SpO2 98%   Physical Exam  Constitutional: She is oriented to person, place, and time. She appears well-developed and well-nourished. No distress.  Eyes closed. Answers questions appropriately. Weak voice. Oriented and lucid.  HENT:  Head:  Normocephalic.  Dried mucous membranes and lips and mouth.  Eyes: Pupils are equal, round, and reactive to light. Conjunctivae are normal. No scleral icterus.  Neck: Normal range of motion. Neck supple. No thyromegaly present.  Cardiovascular: Normal rate and regular rhythm.  Exam reveals no gallop and no friction rub.   No murmur heard. Pulmonary/Chest: Effort normal and breath sounds normal. No respiratory distress. She has no wheezes. She has no rales.  Abdominal: Soft. Bowel sounds are normal. She exhibits no distension. There is no tenderness. There is no rebound.  Musculoskeletal: Normal range of motion.  Neurological: She is alert and oriented to person, place, and time.  Skin: Skin is warm and dry. No rash noted.  Psychiatric: She has a normal mood and affect. Her behavior is normal.     ED Treatments / Results  Labs (all labs ordered are listed, but  only abnormal results are displayed) Labs Reviewed  CBC WITH DIFFERENTIAL/PLATELET - Abnormal; Notable for the following:       Result Value   WBC 14.9 (*)    Neutro Abs 11.1 (*)    Monocytes Absolute 2.0 (*)    All other components within normal limits  COMPREHENSIVE METABOLIC PANEL - Abnormal; Notable for the following:    Potassium 3.3 (*)    Glucose, Bld 101 (*)    BUN 37 (*)    Creatinine, Ser 1.12 (*)    Total Protein 6.1 (*)    Albumin 3.4 (*)    Total Bilirubin 1.8 (*)    GFR calc non Af Amer 40 (*)    GFR calc Af Amer 46 (*)    All other components within normal limits  URINALYSIS, ROUTINE W REFLEX MICROSCOPIC - Abnormal; Notable for the following:    Color, Urine AMBER (*)    APPearance HAZY (*)    Ketones, ur 5 (*)    Protein, ur 30 (*)    Leukocytes, UA SMALL (*)    Bacteria, UA FEW (*)    Squamous Epithelial / LPF 6-30 (*)    Non Squamous Epithelial 0-5 (*)    All other components within normal limits  C DIFFICILE QUICK SCREEN W PCR REFLEX  LACTIC ACID, PLASMA  LACTIC ACID, PLASMA  DIGOXIN LEVEL     EKG  EKG Interpretation None       Radiology No results found.  Procedures Procedures (including critical care time)  Medications Ordered in ED Medications  0.9 %  sodium chloride infusion ( Intravenous New Bag/Given 04/20/17 1230)  0.9 %  sodium chloride infusion ( Intravenous Stopped 04/20/17 1229)     Initial Impression / Assessment and Plan / ED Course  I have reviewed the triage vital signs and the nursing notes.  Pertinent labs & imaging results that were available during my care of the patient were reviewed by me and considered in my medical decision making (see chart for details).   EKG shows sinus rhythm. Unchanged left bundle branch block. First-degree AV block.  Urine still shows 5 ketones. Concentrated 1.025. Hazy with 0-5 rbc's 6-30 wbc's on cath urine. Still positive for  squamous cells. Repeat check culture pending. No acute kidney injury. Patient is profoundly weak requiring assistance to get from the bed to bedside commode. She does live independently. It was discussed with hospitalist regarding options bed for gentle hydration. She does not produce stool here. She is at risk for C. difficile. Will obtain specimen when able and plan treatment based on results.    Final Clinical Impressions(s) / ED Diagnoses   Final diagnoses:  Diarrhea, unspecified type  Weakness    New Prescriptions New Prescriptions   No medications on file     Rolland Porter, MD 04/20/17 1312

## 2017-04-20 NOTE — ED Triage Notes (Signed)
83 y.o female brought in per EMS from home. C/o diarrhea.  Denies n/v. No weakness. B/p 110/56.

## 2017-04-21 ENCOUNTER — Inpatient Hospital Stay (HOSPITAL_COMMUNITY): Payer: Medicare Other

## 2017-04-21 DIAGNOSIS — I255 Ischemic cardiomyopathy: Secondary | ICD-10-CM | POA: Diagnosis present

## 2017-04-21 DIAGNOSIS — E039 Hypothyroidism, unspecified: Secondary | ICD-10-CM | POA: Diagnosis present

## 2017-04-21 DIAGNOSIS — Z66 Do not resuscitate: Secondary | ICD-10-CM | POA: Diagnosis present

## 2017-04-21 DIAGNOSIS — E876 Hypokalemia: Secondary | ICD-10-CM | POA: Diagnosis present

## 2017-04-21 DIAGNOSIS — I252 Old myocardial infarction: Secondary | ICD-10-CM | POA: Diagnosis not present

## 2017-04-21 DIAGNOSIS — K529 Noninfective gastroenteritis and colitis, unspecified: Secondary | ICD-10-CM | POA: Diagnosis not present

## 2017-04-21 DIAGNOSIS — I5022 Chronic systolic (congestive) heart failure: Secondary | ICD-10-CM | POA: Diagnosis present

## 2017-04-21 DIAGNOSIS — E785 Hyperlipidemia, unspecified: Secondary | ICD-10-CM | POA: Diagnosis present

## 2017-04-21 DIAGNOSIS — I110 Hypertensive heart disease with heart failure: Secondary | ICD-10-CM | POA: Diagnosis present

## 2017-04-21 DIAGNOSIS — S52572D Other intraarticular fracture of lower end of left radius, subsequent encounter for closed fracture with routine healing: Secondary | ICD-10-CM | POA: Diagnosis not present

## 2017-04-21 DIAGNOSIS — R531 Weakness: Secondary | ICD-10-CM | POA: Diagnosis present

## 2017-04-21 DIAGNOSIS — I4891 Unspecified atrial fibrillation: Secondary | ICD-10-CM | POA: Diagnosis present

## 2017-04-21 DIAGNOSIS — W19XXXD Unspecified fall, subsequent encounter: Secondary | ICD-10-CM | POA: Diagnosis present

## 2017-04-21 DIAGNOSIS — N390 Urinary tract infection, site not specified: Secondary | ICD-10-CM | POA: Diagnosis present

## 2017-04-21 DIAGNOSIS — A0472 Enterocolitis due to Clostridium difficile, not specified as recurrent: Secondary | ICD-10-CM | POA: Diagnosis present

## 2017-04-21 DIAGNOSIS — Z8673 Personal history of transient ischemic attack (TIA), and cerebral infarction without residual deficits: Secondary | ICD-10-CM | POA: Diagnosis not present

## 2017-04-21 DIAGNOSIS — R17 Unspecified jaundice: Secondary | ICD-10-CM | POA: Diagnosis present

## 2017-04-21 DIAGNOSIS — Z87891 Personal history of nicotine dependence: Secondary | ICD-10-CM | POA: Diagnosis not present

## 2017-04-21 DIAGNOSIS — Z8674 Personal history of sudden cardiac arrest: Secondary | ICD-10-CM | POA: Diagnosis not present

## 2017-04-21 DIAGNOSIS — Z8249 Family history of ischemic heart disease and other diseases of the circulatory system: Secondary | ICD-10-CM | POA: Diagnosis not present

## 2017-04-21 DIAGNOSIS — R410 Disorientation, unspecified: Secondary | ICD-10-CM | POA: Diagnosis present

## 2017-04-21 LAB — CBC
HEMATOCRIT: 35.2 % — AB (ref 36.0–46.0)
HEMOGLOBIN: 11.5 g/dL — AB (ref 12.0–15.0)
MCH: 29.9 pg (ref 26.0–34.0)
MCHC: 32.7 g/dL (ref 30.0–36.0)
MCV: 91.7 fL (ref 78.0–100.0)
Platelets: 156 10*3/uL (ref 150–400)
RBC: 3.84 MIL/uL — AB (ref 3.87–5.11)
RDW: 14.1 % (ref 11.5–15.5)
WBC: 12.1 10*3/uL — AB (ref 4.0–10.5)

## 2017-04-21 LAB — GASTROINTESTINAL PANEL BY PCR, STOOL (REPLACES STOOL CULTURE)

## 2017-04-21 LAB — COMPREHENSIVE METABOLIC PANEL
ALBUMIN: 2.6 g/dL — AB (ref 3.5–5.0)
ALT: 30 U/L (ref 14–54)
ANION GAP: 11 (ref 5–15)
AST: 43 U/L — AB (ref 15–41)
Alkaline Phosphatase: 80 U/L (ref 38–126)
BUN: 33 mg/dL — ABNORMAL HIGH (ref 6–20)
CO2: 20 mmol/L — ABNORMAL LOW (ref 22–32)
Calcium: 8.1 mg/dL — ABNORMAL LOW (ref 8.9–10.3)
Chloride: 107 mmol/L (ref 101–111)
Creatinine, Ser: 0.98 mg/dL (ref 0.44–1.00)
GFR, EST AFRICAN AMERICAN: 54 mL/min — AB (ref >60)
GFR, EST NON AFRICAN AMERICAN: 46 mL/min — AB (ref >60)
Glucose, Bld: 102 mg/dL — ABNORMAL HIGH (ref 65–99)
POTASSIUM: 3.9 mmol/L (ref 3.5–5.1)
SODIUM: 138 mmol/L (ref 135–145)
TOTAL PROTEIN: 5 g/dL — AB (ref 6.5–8.1)
Total Bilirubin: 1.1 mg/dL (ref 0.3–1.2)

## 2017-04-21 LAB — C DIFFICILE QUICK SCREEN W PCR REFLEX
C DIFFICLE (CDIFF) ANTIGEN: POSITIVE — AB
C Diff toxin: NEGATIVE

## 2017-04-21 LAB — CLOSTRIDIUM DIFFICILE BY PCR: Toxigenic C. Difficile by PCR: POSITIVE — AB

## 2017-04-21 MED ORDER — NITROGLYCERIN 0.1 MG/HR TD PT24
0.1000 mg | MEDICATED_PATCH | TRANSDERMAL | Status: DC
  Administered 2017-04-21: 0.1 mg via TRANSDERMAL
  Filled 2017-04-21 (×2): qty 1

## 2017-04-21 MED ORDER — GADOBENATE DIMEGLUMINE 529 MG/ML IV SOLN
15.0000 mL | Freq: Once | INTRAVENOUS | Status: AC | PRN
  Administered 2017-04-21: 12 mL via INTRAVENOUS

## 2017-04-21 NOTE — Progress Notes (Signed)
Pt had brown liquid stool sent down for C-diff. SRP, RN

## 2017-04-21 NOTE — Progress Notes (Signed)
PROGRESS NOTE    [name]  BJY:[id number] DOB: Oct 26, 1916 DOA: 04/20/2017 PCP: Burton Apley, MD    Brief Narrative: 83 y.o. female with medical history significant of HF (EF 20-25%), HTN, Hypothyroidism, recent L wrist fracture, presenting with diarrhea.    Patient notes that she's had diarrhea off and on for about a month. She describes it as brown, no blood. Usually more than twice a day. She denies any abdominal pain, but sometimes has some soreness to her right lower quadrant that she describes bothersome. She denies any nausea or vomiting. She denies any chest pain or shortness of breath. She denies any fevers or chills. She denies any dysuria frequency or urgency. She generally feels weaker than normal over the past month.   Her daughter notes that she is recently looked "washed out." she notes that she's had diarrhea for a long time.  She's had decreased appetite for the past week per her daughter. She also notes that she's been more confused lately. Yesterday she saw her PCP who recommended going to the hospital.  She lives at home alone and her neighbors help her out. Her daughter also helps at home. She uses a cane and walker. She's fallen a few times in the past month. She still  drives a couple blocks.   Stool cdiif is not sent out yet it will be collected today.   Assessment & Plan:   Principal Problem:   Colitis Active Problems:   Hypothyroidism   Diarrhea   HFrEF (heart failure with reduced ejection fraction) (HCC)   Hypokalemia   Elevated bilirubin Colitis  Abdominal Pain  Diarrhea:  CT notable for colonic wall edema and diffuse thickening. Findings compatible with infectious versus inflammatory colitis. Started ceftriaxone and Flagyl. She has pending C. Difficile (at risk with recent abx for L wrist fx) and GI pathogen panel studies.   Clear liquid diet for now APAP for pain (not in significant pain other than with exam) Ceftriaxone/flagyl F/u stool  studies prior to restart lomotil  Leukocytosis  Elevated lactate:  Sec. to above. Lactate initially normal, but now slightly elevated. She is receiving gentle hydration with 100 mL of normal saline an hour given her history of heart failure. At this time will continue in follow-up repeat lactate.  HR and BP are stable.   blood cx, urine cx  lactate  Delirium: daughter notes mental status waxing/waning recently.  A&Ox2 for me today.  Likely due to above.  Delirium prec, ctm.   HFrEF: echo 2012 with EF 25-30%, diastolic dysfunction.  Euvolemic on presentation.  Continue to monitor.  Careful with IVF. Digoxin Holding lasix Nitroglycerin patch decreased the dose to 0.1mg .  Atrial fibrillation rate controlled on digoxin. Check digoxin level.  Hypertension blood pressure soft decrease the dose of nitroglycerin. On digoxin.  Pyuria: recently treated with fosfomycin at previous ED visit.  Also on was on keflex for wrist fracture.  Asx.      f/u cx   DVT prophylaxis lovenox Code Status: dnr Family Communication: none Disposition Plan: tbd   Consultants: none    Procedures:   Antimicrobials: Rocephin and Flagyl   Subjective: Resting in bed denies any diarrhea at this time Objective: Vitals:   04/20/17 2023 04/21/17 0514 04/21/17 0905 04/21/17 1345  BP: 108/61 (!) 123/59  121/62  Pulse: 85 82  78  Resp: Temp: 98 F (36.7 C)   (!) 97.3 F (36.3 C)  TempSrc: Oral Oral  Oral  SpO2: 94% 94%  99%  Weight:   59 kg (130 lb)   Height:    (1.676 m)     Intake/Output Summary (Last 24 hours) at 04/21/17 1413 Last data filed at 04/21/17 1345  Gross per 24 hour  Intake              910 ml  Output                0 ml  Net              910 ml   Filed Weights   04/21/17 0905  Weight: 59 kg (130 lb)    Examination:  General exam: Appears calm and comfortable  Respiratory system: Clear to auscultation. Respiratory effort normal. Cardiovascular  system: S1 & S2 heard, RRR. No JVD, murmurs, rubs, gallops or clicks. No pedal edema. Gastrointestinal system: Abdomen is nondistended, soft and nontender. No organomegaly or masses felt. Normal bowel sounds heard. Central nervous system: Alert and oriented. No focal neurological deficits. Extremities: Symmetric 5 x 5 power. Skin: No rashes, lesions or ulcers Psychiatry: Judgement and insight appear normal. Mood & affect appropriate.     Data Reviewed: I have personally reviewed following labs and imaging studies  CBC:  Recent Labs Lab 04/17/17 1558 04/20/17 0928 04/21/17 0413  WBC 12.9* 14.9* 12.1*  NEUTROABS 9.7* 11.1*  --   HGB 11.9* 13.4 11.5*  HCT 36.3 40.3 35.2*  MCV 92.6 90.8 91.7  PLT 156 183 156   Basic Metabolic Panel:  Recent Labs Lab 04/17/17 1558 04/20/17 0928 04/21/17 0413  NA 138 139 138  K 3.5 3.3* 3.9  CL 103 105 107  CO2 26 23 20*  GLUCOSE 92 101* 102*  BUN 28* 37* 33*  CREATININE 1.06* 1.12* 0.98  CALCIUM 8.6* 8.9 8.1*   GFR: Estimated Creatinine Clearance: 29.9 mL/min (by C-G formula based on SCr of 0.98 mg/dL). Liver Function Tests:  Recent Labs Lab 04/17/17 1558 04/20/17 0928 04/21/17 0413  AST 21 32 43*  ALT 13* 25 30  ALKPHOS 59 97 80  BILITOT 2.1* 1.8* 1.1  PROT 6.0* 6.1* 5.0*  ALBUMIN 3.4* 3.4* 2.6*    Recent Labs Lab 04/17/17 1558  LIPASE 29   No results for input(s): AMMONIA in the last 168 hours. Coagulation Profile: No results for input(s): INR, PROTIME in the last 168 hours. Cardiac Enzymes: No results for input(s): CKTOTAL, CKMB, CKMBINDEX, TROPONINI in the last 168 hours. BNP (last 3 results) No results for input(s): PROBNP in the last 8760 hours. HbA1C: No results for input(s): HGBA1C in the last 72 hours. CBG: No results for input(s): GLUCAP in the last 168 hours. Lipid Profile: No results for input(s): CHOL, HDL, LDLCALC, TRIG, CHOLHDL, LDLDIRECT in the last 72 hours. Thyroid Function Tests: No results  for input(s): TSH, T4TOTAL, FREET4, T3FREE, THYROIDAB in the last 72 hours. Anemia Panel: No results for input(s): VITAMINB12, FOLATE, FERRITIN, TIBC, IRON, RETICCTPCT in the last 72 hours. Sepsis Labs:  Recent Labs Lab 04/20/17 1191 04/20/17 1708 04/20/17 2034 04/20/17 2311  LATICACIDVEN 1.4 2.0* 1.1 1.7    Recent Results (from the past 240 hour(s))  Urine culture     Status: Abnormal   Collection Time: 04/17/17  3:50 PM  Result Value Ref Range Status   Specimen Description URINE, RANDOM  Final   Special Requests NONE  Final   Culture MULTIPLE SPECIES PRESENT, SUGGEST RECOLLECTION (A)  Final   Report Status 04/19/2017 FINAL  Final         Radiology Studies: Ct Abdomen Pelvis W Contrast  Result Date: 04/20/2017 CLINICAL DATA:  Diarrhea. EXAM: CT ABDOMEN AND PELVIS WITH CONTRAST TECHNIQUE: Multidetector CT imaging of the abdomen and pelvis was performed using the standard protocol following bolus administration of intravenous contrast. CONTRAST:  80mL ISOVUE-300 IOPAMIDOL (ISOVUE-300) INJECTION 61% COMPARISON:  None. FINDINGS: Lower chest:  Basilar atelectasis and/or scarring evident. Hepatobiliary: No focal abnormality within the liver parenchyma. Mild intrahepatic biliary duct dilatation is associated with dilatation of the extrahepatic common bile duct measuring up to 14 mm diameter in the head of pancreas. No ductal stone is evident by CT. Pancreas: Diffuse parenchymal atrophy without dilatation of the main duct. Spleen: No splenomegaly. No focal mass lesion. Adrenals/Urinary Tract: No adrenal nodule or mass. Vascular calcification noted in the hilum of each kidney. 3.7 cm cyst identified upper pole right kidney. Small cysts in the left kidney measure up to about 14 mm. No evidence for hydroureter. The urinary bladder appears normal for the degree of distention. Stomach/Bowel: Moderate hiatal hernia with about 50% of the stomach in the chest. Duodenum is normally positioned as is  the ligament of Treitz. No small bowel wall thickening. No small bowel dilatation. The terminal ileum is normal. The appendix is not visualized. There is diffuse edema and wall thickening in the colon, most prominent in the ascending and left segments. Associated diverticulosis noted in the sigmoid colon. Areas of pericolonic edema/inflammation associated. Vascular/Lymphatic: There is abdominal aortic atherosclerosis without aneurysm. There is no gastrohepatic or hepatoduodenal ligament lymphadenopathy. No intraperitoneal or retroperitoneal lymphadenopathy. No pelvic sidewall lymphadenopathy. Reproductive: The uterus has normal CT imaging appearance. There is no adnexal mass. Other: Small volume intraperitoneal free fluid noted in the para colic gutters and pelvis. Musculoskeletal: A small right groin hernia contains only fat. Bone windows reveal no worrisome lytic or sclerotic osseous lesions. Degenerative disc disease is most notable at T12-L1, L1-2, and L2-3. IMPRESSION: 1. Colonic wall edema and thickening diffusely but most evident in the right and left segments. Imaging features are compatible with an infectious/inflammatory colitis. 2. Fairly dramatic distention of the gallbladder with intra and extrahepatic biliary duct dilatation. The common bile duct remains dilated to the level of the ampulla. No evidence for common bile duct stone by CT, but MRCP or ERCP may prove helpful to further evaluate, specially in the setting of abnormal LFTs. 3.  Aortic Atherosclerois (ICD10-170.0) 4. Small right groin hernia contains only fat. Electronically Signed   By: Kennith Center M.D.   On: 04/20/2017 16:22        Scheduled Meds: . atorvastatin  10 mg Oral Daily  . digoxin  0.125 mg Oral Daily  . enoxaparin (LOVENOX) injection  30 mg Subcutaneous Q24H  . feeding supplement  1 Container Oral TID BM  . levothyroxine  50 mcg Oral QAC breakfast  . nitroGLYCERIN  0.2 mg Transdermal Q M,W,F-2000  . potassium  chloride  10 mEq Oral Daily   Continuous Infusions: . sodium chloride    . cefTRIAXone (ROCEPHIN)  IV Stopped (04/20/17 2116)  . metronidazole 500 mg (04/21/17 1145)     LOS: 0 days     Alwyn Ren, MD [facility]  If 7PM-7AM, please contact night-coverage [email or web address] Password [email or web address] 04/21/2017, 2:13 PM

## 2017-04-21 NOTE — Evaluation (Signed)
Physical Therapy Evaluation Patient Details Name: [name] MRN: [id number] DOB: 1916/09/28 Today's Date: 04/21/2017   History of Present Illness  83 y.o. female with medical history significant of HF (EF 20-25%), HTN, Hypothyroidism, recent L wrist fracture (~9/26), presenting with diarrhea  Clinical Impression  Pt admitted with above diagnosis. Pt currently with functional limitations due to the deficits listed below (see PT Problem List).  Pt will benefit from skilled PT to increase their independence and safety with mobility to allow discharge to the venue listed below.   Pt is at incr risk for falls; recent fall ~ 2wks ago and reports previous fall at [facility]- (a few years ago resulting in back injury); will continue to follow in acute setting; will need 24 hour assist/assist with mobility at the very least; HHPT/HHOT If D/C's home     Follow Up Recommendations SNF;Home health PT;Supervision/Assistance - 24 hour (would benefit from SNF--pt is obs/Medicare)    Equipment Recommendations  None recommended by PT (if has PFRW)    Recommendations for Other Services       Precautions / Restrictions Precautions Precautions: Fall Precaution Comments: wrist fx--assume NWB Restrictions Other Position/Activity Restrictions: assume NWB L wrist      Mobility  Bed Mobility Overal bed mobility: Needs Assistance Bed Mobility: Supine to Sit     Supine to sit: Min guard     General bed mobility comments: incr time, assist to scoot to EOB once in sitting  Transfers Overall transfer level: Needs assistance Equipment used: 1 person hand held assist Transfers: Sit to/from Stand Sit to Stand: Min assist         General transfer comment: cues for safety, hand placement; assist to rise and steady  Ambulation/Gait Ambulation/Gait assistance: Min assist Ambulation Distance (Feet): 75 Feet Assistive device: 1 person hand held assist Gait Pattern/deviations: Narrow base of  support;Decreased stride length     General Gait Details: IV pole push and L HHA/elbow support; unsteady gait, especially with turns, no overt LOB but requiring assist throughout to steady   Stairs            Wheelchair Mobility    Modified Rankin (Stroke Patients Only)       Balance Overall balance assessment: Needs assistance;History of Falls   Sitting balance-Leahy Scale: Fair       Standing balance-Leahy Scale: Poor Standing balance comment: braces LEs on bed to maintain standing without UE support;                              Pertinent Vitals/Pain Pain Assessment: No/denies pain    Home Living Family/patient expects to be discharged to:: Unsure Living Arrangements: Alone Available Help at Discharge: Neighbor Type of Home: House Home Access: Stairs to enter   [employer] of Steps: 2 Home Layout: One level Home Equipment: Environmental consultant - 2 wheels;Other (comment) (bil platforms--?) Additional Comments: confused initially but improved with stimulation/blinds opened, conversation, etc;      Prior Function Level of Independence: Independent;Independent with assistive device(s)         Comments: amb with walker or cane--pt is not reliable with info initially--it seems she was amb with bil PFRW and assist mostly from nbor, although she does have a dtr that lives nearby and checks on her     Hand Dominance        Extremity/Trunk Assessment   Upper Extremity Assessment Upper Extremity Assessment: LUE deficits/detail LUE Deficits / Details:  AROM fingers grossly WFL; elbow AROM grossly WFL; strength 2+/5 to 3/5; wrist NT-- splinted d/t recent fxs LUE: Unable to fully assess due to immobilization    Lower Extremity Assessment Lower Extremity Assessment: Generalized weakness       Communication   Communication: HOH  Cognition Arousal/Alertness: Awake/alert Behavior During Therapy: WFL for tasks assessed/performed Overall Cognitive  Status: Impaired/Different from baseline Area of Impairment: Orientation                 Orientation Level: Disoriented to;Place;Time             General Comments: knows the date, unsure of year--states it is 1928      General Comments      Exercises     Assessment/Plan    PT Assessment Patient needs continued PT services  PT Problem List Decreased strength;Decreased activity tolerance;Decreased balance;Decreased mobility       PT Treatment Interventions DME instruction;Gait training;Functional mobility training;Therapeutic activities;Therapeutic exercise;Patient/family education;Balance training    PT Goals (Current goals can be found in the Care Plan section)  Acute Rehab PT Goals Patient Stated Goal: back to being IND PT Goal Formulation: With patient Time For Goal Achievement: 04/28/17 Potential to Achieve Goals: Good    Frequency Min 3X/week   Barriers to discharge        Co-evaluation               AM-PAC PT "6 Clicks" Daily Activity  Outcome Measure Difficulty turning over in bed (including adjusting bedclothes, sheets and blankets)?: Unable Difficulty moving from lying on back to sitting on the side of the bed? : Unable Difficulty sitting down on and standing up from a chair with arms (e.g., wheelchair, bedside commode, etc,.)?: Unable Help needed moving to and from a bed to chair (including a wheelchair)?: A Little Help needed walking in hospital room?: A Little Help needed climbing 3-5 steps with a railing? : A Lot 6 Click Score: 11    End of Session Equipment Utilized During Treatment: Gait belt Activity Tolerance: Patient tolerated treatment well Patient left: in chair;with call bell/phone within reach;with chair alarm set Nurse Communication: Mobility status PT Visit Diagnosis: Unsteadiness on feet (R26.81);Muscle weakness (generalized) (M62.81);History of falling (Z91.81)    Time: 4098-1191 PT Time Calculation (min) (ACUTE  ONLY): 26 min   Charges:   PT Evaluation $PT Eval Low Complexity: 1 Low PT Treatments $Gait Training: 8-22 mins   PT G CodesDrucilla Chalet, PT Pager: [phone] 04/21/2017   Drucilla Chalet 04/21/2017, 12:05 PM

## 2017-04-21 NOTE — Consult Note (Signed)
WOC Nurse wound consult note Reason for Consult: Fracture to left wrist with resulting open lesion.  Partially granulated with 50% thin adherent fibrin slough.  Maceration to periwound.  Wound type:traumatic Pressure Injury POA: NA Measurement: 4 cm x 2.5 cm unable to visualize wound bed due to fibrin slough.   Wound ZOX:WRUE pink, maceration and fibrin slough Drainage (amount, consistency, odor)minimal serosanguinous.  WOund has remained overly moist.  Periwound:maceration Dressing procedure/placement/frequency:Cleanse wound to left wrist with NS.  Apply NS moist Aquacel AG.  Cover with dry gauze and wrap with kerlix.  Secure with ace wrap.  Will not follow at this time.  Please re-consult if needed.  Maple Hudson RN BSN CWON Pager [phone]

## 2017-04-21 NOTE — Evaluation (Signed)
Occupational Therapy Evaluation Patient Details Name: [name] MRN: [id number] DOB: 01/30/1917 Today's Date: 04/21/2017    History of Present Illness 83 y.o. female with medical history significant of HF (EF 20-25%), HTN, Hypothyroidism, recent L wrist fracture (~9/26), presenting with diarrhea   Clinical Impression   Pt was admitted for the above. She lives alone and has support from neighbor and daughter, per chart.  Pt states she was independent with bathing/dressing. She currently needs min A for sit to stand and transfers and mod A for LB dressing. Only performed SPT during evaluation. She will benefit from continued OT. Goals in acute are for supervision to min guard assist    Follow Up Recommendations  SNF;Supervision/Assistance - 24 hour (HHOT, if home)    Equipment Recommendations  3 in 1 bedside commode (if pt doesn't have)    Recommendations for Other Services       Precautions / Restrictions Precautions Precautions: Fall Precaution Comments: wrist fx--assume NWB Restrictions Other Position/Activity Restrictions: assume NWB L wrist      Mobility Bed Mobility Overal bed mobility: Needs Assistance Bed Mobility: Supine to Sit     Supine to sit: Min guard Sit to supine: Min assist   General bed mobility comments: for legs  Transfers Overall transfer level: Needs assistance Equipment used: 1 person hand held assist Transfers: Sit to/from Stand Sit to Stand: Min assist         General transfer comment: cues for safety, hand placement; assist to rise and steady    Balance Overall balance assessment: Needs assistance;History of Falls   Sitting balance-Leahy Scale: Fair       Standing balance-Leahy Scale: Poor Standing balance comment: braces LEs on bed to maintain standing without UE support;                            ADL either performed or assessed with clinical judgement   ADL Overall ADL's : Needs  assistance/impaired Eating/Feeding: Independent   Grooming: Set up;Sitting   Upper Body Bathing: Set up;Sitting   Lower Body Bathing: Minimal assistance;Sit to/from stand   Upper Body Dressing : Set up;Sitting   Lower Body Dressing: Moderate assistance;Sit to/from stand   Toilet Transfer: Minimal assistance;Stand-pivot (to bed, +1 assistance)   Toileting- Clothing Manipulation and Hygiene: Minimal assistance;Sit to/from stand         General ADL Comments: pt was sitting up in chair and very fatiqued.  She immediately fell asleep when back in bed     Vision         Perception     Praxis      Pertinent Vitals/Pain Pain Assessment: Faces Faces Pain Scale: Hurts little more Pain Location: R side Pain Descriptors / Indicators: Aching Pain Intervention(s): Limited activity within patient's tolerance;Monitored during session;Repositioned (notified RN)     Hand Dominance     Extremity/Trunk Assessment Upper Extremity Assessment Upper Extremity Assessment: LUE deficits/detail LUE Deficits / Details: AROM fingers grossly WFL; elbow AROM grossly WFL; strength 2+/5 to 3/5; wrist NT-- splinted d/t recent fxs LUE: Unable to fully assess due to immobilization          Communication Communication Communication: HOH   Cognition Arousal/Alertness: Awake/alert Behavior During Therapy: WFL for tasks assessed/performed Overall Cognitive Status: No family/caregiver present to determine baseline cognitive functioning  General Comments       Exercises     Shoulder Instructions      Home Living Family/patient expects to be discharged to:: Unsure Living Arrangements: Alone Available Help at Discharge: Family;Neighbor (daughter)                             Additional Comments: unsure of bathroom DME      Prior Functioning/Environment Level of Independence: Independent;Independent with assistive device(s)         Comments: amb with walker or cane--pt is not reliable with info initially--it seems she was amb with bil PFRW and assist mostly from nbor, although she does have a dtr that lives nearby and checks on her.  Pt staes she was independent wtih adls        OT Problem List: Decreased strength;Decreased activity tolerance;Impaired balance (sitting and/or standing);Pain;Decreased knowledge of use of DME or AE      OT Treatment/Interventions: Self-care/ADL training;DME and/or AE instruction;Balance training;Patient/family education    OT Goals(Current goals can be found in the care plan section) Acute Rehab OT Goals Patient Stated Goal: back to being IND OT Goal Formulation: With patient Time For Goal Achievement: 04/28/17 Potential to Achieve Goals: Good ADL Goals Pt Will Perform Grooming: with supervision;standing Pt Will Perform Lower Body Bathing: with min guard assist;sit to/from stand Pt Will Perform Lower Body Dressing: with min guard assist;sit to/from stand Pt Will Transfer to Toilet: with min guard assist;ambulating;bedside commode;stand pivot transfer Pt Will Perform Toileting - Clothing Manipulation and hygiene: with min guard assist;sit to/from stand  OT Frequency: Min 2X/week   Barriers to D/C:            Co-evaluation              AM-PAC PT "6 Clicks" Daily Activity     Outcome Measure Help from another person eating meals?: None Help from another person taking care of personal grooming?: A Little Help from another person toileting, which includes using toliet, bedpan, or urinal?: A Little Help from another person bathing (including washing, rinsing, drying)?: A Little Help from another person to put on and taking off regular upper body clothing?: A Little Help from another person to put on and taking off regular lower body clothing?: A Lot 6 Click Score: 18   End of Session Nurse Communication:  (pain)  Activity Tolerance: Patient limited by  fatigue Patient left: in bed;with call bell/phone within reach;with bed alarm set  OT Visit Diagnosis: Muscle weakness (generalized) (M62.81);Unsteadiness on feet (R26.81)                Time: 9147-8295 OT Time Calculation (min): 11 min Charges:  OT General Charges $OT Visit: 1 Visit OT Evaluation $OT Eval Low Complexity: 1 Low G-Codes: OT G-codes **NOT FOR INPATIENT CLASS** Functional Assessment Tool Used: AM-PAC 6 Clicks Daily Activity Functional Limitation: Self care Self Care Current Status (A2130): At least 40 percent but less than 60 percent impaired, limited or restricted Self Care Goal Status (Q6578): At least 20 percent but less than 40 percent impaired, limited or restricted   Marica Otter, OTR/L [phone] 04/21/2017  Lezly Rumpf 04/21/2017, 3:34 PM

## 2017-04-21 NOTE — Progress Notes (Signed)
Results for KAHLEA, COBERT (MRN [id number]) as of 04/21/2017 18:37  Ref. Range 04/21/2017 15:22  C Diff antigen Latest Ref Range: NEGATIVE  POSITIVE (A)  C Diff interpretation Unknown Results are indet...  C Diff toxin Latest Ref Range: NEGATIVE  NEGATIVE

## 2017-04-21 NOTE — Progress Notes (Signed)
Initial Nutrition Assessment  DOCUMENTATION CODES:   Not applicable  INTERVENTION:   Boost Breeze po TID, each supplement provides 250 kcal and 9 grams of protein  NUTRITION DIAGNOSIS:   Inadequate oral intake related to poor appetite as evidenced by per patient/family report.  GOAL:   Patient will meet greater than or equal to 90% of their needs  MONITOR:   PO intake, Supplement acceptance, Weight trends, Labs  REASON FOR ASSESSMENT:   Malnutrition Screening Tool   ASSESSMENT:   Pt with PMH significant for CHF, HTN, HLD, CVA, and hypothyroidism. Presents this admission with colitis. MD notes: CT notable for colonic wall edema and diffuse thickening suggestive of infection versus inflammatory colitis.    Pt confused upon visit. No family present to provide nutrition related history. MD notes pt had ongoing diarrhea off and on for one month and poor PO intake x1 week related to increased confusion. Pt currently on clear liquid diet. RD observed breakfast tray with 25% meal completion.   Weight records indicate pt has maintained her weight of 130-133 lb since September 2017.   Nutrition-Focused physical exam completed. Findings are moderate to severe fat depletion, moderate to severe muscle depletion, and no edema. Suspect depletions are likely from advanced age versus prolonged poor intake/weight loss.   Medications reviewed and include: KCl, IV abx, NS @ 50 ml/hr Labs reviewed: BUN 33 (H) AST 43 (H)   Diet Order:  Diet clear liquid Room service appropriate? Yes; Fluid consistency: Thin  Skin:  Reviewed, no issues  Last BM:  04/21/17  Height:   Ht Readings from Last 1 Encounters:  04/21/17  (1.676 m)    Weight:   Wt Readings from Last 1 Encounters:  04/21/17 130 lb (59 kg)    Ideal Body Weight:  59.1 kg  BMI:  Body mass index is 20.98 kg/m.  Estimated Nutritional Needs:   Kcal:  1700-1900 kcal (29-32 kcal/kg)  Protein:  85-95 grams (1.4-1.6  g/kg)  Fluid:  >1.7 L/day  EDUCATION NEEDS:   No education needs identified at this time  Vanessa Kick RD, LDN Clinical Nutrition Pager # - [phone]

## 2017-04-22 ENCOUNTER — Encounter (HOSPITAL_COMMUNITY): Payer: Self-pay

## 2017-04-22 DIAGNOSIS — K529 Noninfective gastroenteritis and colitis, unspecified: Secondary | ICD-10-CM

## 2017-04-22 LAB — CBC
HCT: 34.8 % — ABNORMAL LOW (ref 36.0–46.0)
Hemoglobin: 11.2 g/dL — ABNORMAL LOW (ref 12.0–15.0)
MCH: 29.7 pg (ref 26.0–34.0)
MCHC: 32.2 g/dL (ref 30.0–36.0)
MCV: 92.3 fL (ref 78.0–100.0)
PLATELETS: 154 10*3/uL (ref 150–400)
RBC: 3.77 MIL/uL — ABNORMAL LOW (ref 3.87–5.11)
RDW: 14.4 % (ref 11.5–15.5)
WBC: 13.8 10*3/uL — ABNORMAL HIGH (ref 4.0–10.5)

## 2017-04-22 LAB — BASIC METABOLIC PANEL
Anion gap: 9 (ref 5–15)
BUN: 28 mg/dL — AB (ref 6–20)
CALCIUM: 8.2 mg/dL — AB (ref 8.9–10.3)
CO2: 20 mmol/L — ABNORMAL LOW (ref 22–32)
CREATININE: 0.98 mg/dL (ref 0.44–1.00)
Chloride: 111 mmol/L (ref 101–111)
GFR, EST AFRICAN AMERICAN: 54 mL/min — AB (ref >60)
GFR, EST NON AFRICAN AMERICAN: 46 mL/min — AB (ref >60)
Glucose, Bld: 107 mg/dL — ABNORMAL HIGH (ref 65–99)
Potassium: 3.9 mmol/L (ref 3.5–5.1)
SODIUM: 140 mmol/L (ref 135–145)

## 2017-04-22 LAB — URINE CULTURE

## 2017-04-22 LAB — DIGOXIN LEVEL: DIGOXIN LVL: 0.8 ng/mL (ref 0.8–2.0)

## 2017-04-22 MED ORDER — VANCOMYCIN 50 MG/ML ORAL SOLUTION
125.0000 mg | Freq: Four times a day (QID) | ORAL | Status: DC
  Administered 2017-04-22 – 2017-04-24 (×10): 125 mg via ORAL
  Filled 2017-04-22 (×10): qty 2.5

## 2017-04-22 NOTE — Progress Notes (Signed)
Patient ID: [name], female   DOB: 08/03/1916, 83 y.o.   MRN: [id number]  PROGRESS NOTE    NADALYN DERINGER  QIO:[id number] DOB: 1917-04-05 DOA: 04/20/2017  PCP: Burton Apley, MD   Brief Narrative:  83 year old female with chronic systolic CHF, hypothyroidism, hypertension who presented with diarrhea and was found to have C.diff colitis.   Assessment & Plan:   Principal Problem:   C.diff Colitis / Leukocytosis / Diarrhea of infections origin  - Stool PCR C.diff is positive - Started vanco PO today - GI pathogen panel negative  - Continue supportive care, hydration  - CT scan on admission showed inflammatory/infections colitis   Active Problems:   Hypothyroidism - Continue synthroid     Chronic systolic CHF - Stable resp status - Continue digoxin    Hypokalemia - Due to GI losses - Supplemented     Dyslipidemia - Continue synthroid     UTI - Continue rocephin - Multiple species on UCx    DVT prophylaxis: Lovenox subQ Code Status: DNR?DNI  Family Communication: no family at the bedside this am Disposition Plan: home once diarrhea subsides   Consultants:   PT - SNF or HHPT  Procedures:   None   Antimicrobials:   Vanco PO for C.diff colitis 10/13 -->   Subjective: No overnight events.  Objective: Vitals:   04/21/17 0905 04/21/17 1345 04/21/17 2254 04/22/17 0444  BP:  121/62 117/71 (!) 148/72  Pulse:  78 87 81  Resp:  Temp:  (!) 97.3 F (36.3 C) 97.7 F (36.5 C) 98.1 F (36.7 C)  TempSrc:  Oral Oral Oral  SpO2:  99% 95% 94%  Weight: 59 kg (130 lb)     Height:  (1.676 m)       Intake/Output Summary (Last 24 hours) at 04/22/17 1207 Last data filed at 04/22/17 0900  Gross per 24 hour  Intake              590 ml  Output              550 ml  Net               40 ml   Filed Weights   04/21/17 0905  Weight: 59 kg (130 lb)    Examination:  General exam: Appears calm and comfortable  Respiratory system: Clear  to auscultation. Respiratory effort normal. Cardiovascular system: S1 & S2 heard, RRR. No JVD, murmurs, rubs, gallops or clicks. No pedal edema. Gastrointestinal system: Abdomen is nondistended, soft and nontender. No organomegaly or masses felt. Normal bowel sounds heard. Central nervous system: Alert and oriented. No focal neurological deficits. Extremities: Symmetric 5 x 5 power. Skin: No rashes, lesions or ulcers Psychiatry: Judgement and insight appear normal. Mood & affect appropriate.   Data Reviewed: I have personally reviewed following labs and imaging studies  CBC:  Recent Labs Lab 04/17/17 1558 04/20/17 0928 04/21/17 0413 04/22/17 0411  WBC 12.9* 14.9* 12.1* 13.8*  NEUTROABS 9.7* 11.1*  --   --   HGB 11.9* 13.4 11.5* 11.2*  HCT 36.3 40.3 35.2* 34.8*  MCV 92.6 90.8 91.7 92.3  PLT 156 183 156 154   Basic Metabolic Panel:  Recent Labs Lab 04/17/17 1558 04/20/17 0928 04/21/17 0413 04/22/17 0411  NA 138 139 138 140  K 3.5 3.3* 3.9 3.9  CL 103 105 107 111  CO2 26 23 20* 20*  GLUCOSE 92 101* 102* 107*  BUN 28* 37*  33* 28*  CREATININE 1.06* 1.12* 0.98 0.98  CALCIUM 8.6* 8.9 8.1* 8.2*   GFR: Estimated Creatinine Clearance: 29.9 mL/min (by C-G formula based on SCr of 0.98 mg/dL). Liver Function Tests:  Recent Labs Lab 04/17/17 1558 04/20/17 0928 04/21/17 0413  AST 21 32 43*  ALT 13* 25 30  ALKPHOS 59 97 80  BILITOT 2.1* 1.8* 1.1  PROT 6.0* 6.1* 5.0*  ALBUMIN 3.4* 3.4* 2.6*    Recent Labs Lab 04/17/17 1558  LIPASE 29   No results for input(s): AMMONIA in the last 168 hours. Coagulation Profile: No results for input(s): INR, PROTIME in the last 168 hours. Cardiac Enzymes: No results for input(s): CKTOTAL, CKMB, CKMBINDEX, TROPONINI in the last 168 hours. BNP (last 3 results) No results for input(s): PROBNP in the last 8760 hours. HbA1C: No results for input(s): HGBA1C in the last 72 hours. CBG: No results for input(s): GLUCAP in the last 168  hours. Lipid Profile: No results for input(s): CHOL, HDL, LDLCALC, TRIG, CHOLHDL, LDLDIRECT in the last 72 hours. Thyroid Function Tests: No results for input(s): TSH, T4TOTAL, FREET4, T3FREE, THYROIDAB in the last 72 hours. Anemia Panel: No results for input(s): VITAMINB12, FOLATE, FERRITIN, TIBC, IRON, RETICCTPCT in the last 72 hours. Urine analysis:    Component Value Date/Time   COLORURINE AMBER (A) 04/20/2017 1120   APPEARANCEUR HAZY (A) 04/20/2017 1120   LABSPEC 1.025 04/20/2017 1120   PHURINE 5.0 04/20/2017 1120   GLUCOSEU NEGATIVE 04/20/2017 1120   HGBUR NEGATIVE 04/20/2017 1120   BILIRUBINUR NEGATIVE 04/20/2017 1120   KETONESUR 5 (A) 04/20/2017 1120   PROTEINUR 30 (A) 04/20/2017 1120   UROBILINOGEN 0.2 04/10/2014 0745   NITRITE NEGATIVE 04/20/2017 1120   LEUKOCYTESUR SMALL (A) 04/20/2017 1120   Sepsis Labs: @LABRCNTIP (procalcitonin:4,lacticidven:4)    Urine culture     Status: Abnormal   Collection Time: 04/17/17  3:50 PM  Result Value Ref Range Status   Specimen Description URINE, RANDOM  Final   Special Requests NONE  Final   Culture MULTIPLE SPECIES PRESENT, SUGGEST RECOLLECTION (A)  Final   Report Status 04/19/2017 FINAL  Final  Culture, Urine     Status: Abnormal   Collection Time: 04/20/17 11:20 AM  Result Value Ref Range Status   Specimen Description URINE, RANDOM  Final   Special Requests NONE  Final   Report Status 04/22/2017 FINAL  Final  C difficile quick scan w PCR reflex     Status: Abnormal   Collection Time: 04/21/17  3:22 PM  Result Value Ref Range Status   C Diff antigen POSITIVE (A) NEGATIVE Final   C Diff toxin NEGATIVE NEGATIVE Final   C Diff interpretation Results are indeterminate. See PCR results.  Final  Gastrointestinal Panel by PCR , Stool     Status: None   Collection Time: 04/21/17  3:22 PM  Result Value Ref Range Status   Campylobacter species NOT DETECTED NOT DETECTED Final   Plesimonas shigelloides NOT DETECTED NOT DETECTED  Final   Salmonella species NOT DETECTED NOT DETECTED Final   Yersinia enterocolitica NOT DETECTED NOT DETECTED Final   Vibrio species NOT DETECTED NOT DETECTED Final   Vibrio cholerae NOT DETECTED NOT DETECTED Final   Enteroaggregative E coli (EAEC) NOT DETECTED NOT DETECTED Final   Enteropathogenic E coli (EPEC) NOT DETECTED NOT DETECTED Final   Enterotoxigenic E coli (ETEC) NOT DETECTED NOT DETECTED Final   Shiga like toxin producing E coli (STEC) NOT DETECTED NOT DETECTED Final   Shigella/Enteroinvasive E coli (EIEC)  NOT DETECTED NOT DETECTED Final   Cryptosporidium NOT DETECTED NOT DETECTED Final   Cyclospora cayetanensis NOT DETECTED NOT DETECTED Final   Entamoeba histolytica NOT DETECTED NOT DETECTED Final   Giardia lamblia NOT DETECTED NOT DETECTED Final   Adenovirus F40/41 NOT DETECTED NOT DETECTED Final   Astrovirus NOT DETECTED NOT DETECTED Final   Norovirus GI/GII NOT DETECTED NOT DETECTED Final   Rotavirus A NOT DETECTED NOT DETECTED Final   Sapovirus (I, II, IV, and V) NOT DETECTED NOT DETECTED Final  Clostridium Difficile by PCR     Status: Abnormal   Collection Time: 04/21/17  3:22 PM  Result Value Ref Range Status   Toxigenic C Difficile by pcr POSITIVE (A) NEGATIVE Final    Comment: Positive for toxigenic C. difficile with little to no toxin production. Only treat if clinical presentation suggests symptomatic illness. Performed at [facility], [address], [place], [place] [address]       Radiology Studies: Ct Abdomen Pelvis W Contrast  Result Date: 04/20/2017 CLINICAL DATA:  Diarrhea. EXAM: CT ABDOMEN AND PELVIS WITH CONTRAST TECHNIQUE: Multidetector CT imaging of the abdomen and pelvis was performed using the standard protocol following bolus administration of intravenous contrast. CONTRAST:  80mL ISOVUE-300 IOPAMIDOL (ISOVUE-300) INJECTION 61% COMPARISON:  None. FINDINGS: Lower chest:  Basilar atelectasis and/or scarring evident. Hepatobiliary: No focal  abnormality within the liver parenchyma. Mild intrahepatic biliary duct dilatation is associated with dilatation of the extrahepatic common bile duct measuring up to 14 mm diameter in the head of pancreas. No ductal stone is evident by CT. Pancreas: Diffuse parenchymal atrophy without dilatation of the main duct. Spleen: No splenomegaly. No focal mass lesion. Adrenals/Urinary Tract: No adrenal nodule or mass. Vascular calcification noted in the hilum of each kidney. 3.7 cm cyst identified upper pole right kidney. Small cysts in the left kidney measure up to about 14 mm. No evidence for hydroureter. The urinary bladder appears normal for the degree of distention. Stomach/Bowel: Moderate hiatal hernia with about 50% of the stomach in the chest. Duodenum is normally positioned as is the ligament of Treitz. No small bowel wall thickening. No small bowel dilatation. The terminal ileum is normal. The appendix is not visualized. There is diffuse edema and wall thickening in the colon, most prominent in the ascending and left segments. Associated diverticulosis noted in the sigmoid colon. Areas of pericolonic edema/inflammation associated. Vascular/Lymphatic: There is abdominal aortic atherosclerosis without aneurysm. There is no gastrohepatic or hepatoduodenal ligament lymphadenopathy. No intraperitoneal or retroperitoneal lymphadenopathy. No pelvic sidewall lymphadenopathy. Reproductive: The uterus has normal CT imaging appearance. There is no adnexal mass. Other: Small volume intraperitoneal free fluid noted in the para colic gutters and pelvis. Musculoskeletal: A small right groin hernia contains only fat. Bone windows reveal no worrisome lytic or sclerotic osseous lesions. Degenerative disc disease is most notable at T12-L1, L1-2, and L2-3. IMPRESSION: 1. Colonic wall edema and thickening diffusely but most evident in the right and left segments. Imaging features are compatible with an infectious/inflammatory colitis.  2. Fairly dramatic distention of the gallbladder with intra and extrahepatic biliary duct dilatation. The common bile duct remains dilated to the level of the ampulla. No evidence for common bile duct stone by CT, but MRCP or ERCP may prove helpful to further evaluate, specially in the setting of abnormal LFTs. 3.  Aortic Atherosclerois (ICD10-170.0) 4. Small right groin hernia contains only fat. Electronically Signed   By: Kennith Center M.D.   On: 04/20/2017 16:22   Mr  3d Recon At Scanner  Result Date: 04/21/2017 CLINICAL DATA:  Abnormal liver function tests. EXAM: MRI ABDOMEN WITHOUT AND WITH CONTRAST (INCLUDING MRCP) TECHNIQUE: Multiplanar multisequence MR imaging of the abdomen was performed both before and after the administration of intravenous contrast. Heavily T2-weighted images of the biliary and pancreatic ducts were obtained, and three-dimensional MRCP images were rendered by post processing. CONTRAST:  12mL MULTIHANCE GADOBENATE DIMEGLUMINE 529 MG/ML IV SOLN COMPARISON:  CT 04/20/2017 FINDINGS: Lower chest:  Lung bases are clear. Hepatobiliary: Mild intrahepatic and moderate extrahepatic biliary duct dilatation. The common hepatic duct measures 12 mm and the common bile duct measures 12 mm. There is fusiform dilatation of the common bile duct to the level of the ampulla. No filling defect within the common bile duct. No clear external compression. The gallbladder is distended to 5 cm. No gallstones identified. No focal hepatic lesion. Pancreas: No pancreatic head lesion identified. Ductal variant anatomy in pancreas divisum anatomy. There is no significant dilatation the pancreatic duct. There is pancreatic atrophy typical for age. Spleen: Normal spleen. Adrenals/urinary tract: Motion degradation on the post-contrast imaging. Adrenal glands grossly normal. Nonenhancing cysts of the kidneys. Stomach/Bowel: Stomach and limited of the small bowel is unremarkable Vascular/Lymphatic: Abdominal aortic  normal caliber. No retroperitoneal periportal lymphadenopathy. Musculoskeletal: No aggressive osseous lesion IMPRESSION: 1. Intrahepatic and extrahepatic biliary duct dilatation without obstructing lesion identified. Dilatation of the gallbladder additionally. The bilirubin is normal, therefore favor chronic dilatation. Cannot exclude ampullary lesion but again not favored with normal bilirubin. 2. No obstructing pancreatic lesion identified. Pancreas divisum variant ductal anatomy. 3. Exam is degraded by respiration. Electronically Signed   By: Genevive Bi M.D.   On: 04/21/2017 20:56   Mr Abdomen Mrcp Vivien Rossetti Contast  Result Date: 04/21/2017 CLINICAL DATA:  Abnormal liver function tests. EXAM: MRI ABDOMEN WITHOUT AND WITH CONTRAST (INCLUDING MRCP) TECHNIQUE: Multiplanar multisequence MR imaging of the abdomen was performed both before and after the administration of intravenous contrast. Heavily T2-weighted images of the biliary and pancreatic ducts were obtained, and three-dimensional MRCP images were rendered by post processing. CONTRAST:  12mL MULTIHANCE GADOBENATE DIMEGLUMINE 529 MG/ML IV SOLN COMPARISON:  CT 04/20/2017 FINDINGS: Lower chest:  Lung bases are clear. Hepatobiliary: Mild intrahepatic and moderate extrahepatic biliary duct dilatation. The common hepatic duct measures 12 mm and the common bile duct measures 12 mm. There is fusiform dilatation of the common bile duct to the level of the ampulla. No filling defect within the common bile duct. No clear external compression. The gallbladder is distended to 5 cm. No gallstones identified. No focal hepatic lesion. Pancreas: No pancreatic head lesion identified. Ductal variant anatomy in pancreas divisum anatomy. There is no significant dilatation the pancreatic duct. There is pancreatic atrophy typical for age. Spleen: Normal spleen. Adrenals/urinary tract: Motion degradation on the post-contrast imaging. Adrenal glands grossly normal. Nonenhancing  cysts of the kidneys. Stomach/Bowel: Stomach and limited of the small bowel is unremarkable Vascular/Lymphatic: Abdominal aortic normal caliber. No retroperitoneal periportal lymphadenopathy. Musculoskeletal: No aggressive osseous lesion IMPRESSION: 1. Intrahepatic and extrahepatic biliary duct dilatation without obstructing lesion identified. Dilatation of the gallbladder additionally. The bilirubin is normal, therefore favor chronic dilatation. Cannot exclude ampullary lesion but again not favored with normal bilirubin. 2. No obstructing pancreatic lesion identified. Pancreas divisum variant ductal anatomy. 3. Exam is degraded by respiration. Electronically Signed   By: Genevive Bi M.D.   On: 04/21/2017 20:56        Scheduled Meds: . atorvastatin  10 mg Oral Daily  .  digoxin  0.125 mg Oral Daily  . enoxaparin (LOVENOX) injection  30 mg Subcutaneous Q24H  . feeding supplement  1 Container Oral TID BM  . levothyroxine  50 mcg Oral QAC breakfast  . nitroGLYCERIN  0.1 mg Transdermal Q M,W,F-2000  . potassium chloride  10 mEq Oral Daily  . vancomycin  125 mg Oral Q6H   Continuous Infusions: . cefTRIAXone (ROCEPHIN)  IV Stopped (04/21/17 2032)  . metronidazole Stopped (04/22/17 0544)     LOS: 1 day    Time spent: 25 minutes  Greater than 50% of the time spent on counseling and coordinating the care.   Manson Passey, MD [facility] Pager [phone]  If 7PM-7AM, please contact night-coverage [email or web address] Password [email or web address] 04/22/2017, 12:07 PM

## 2017-04-23 ENCOUNTER — Encounter (HOSPITAL_COMMUNITY): Payer: Self-pay

## 2017-04-23 LAB — CBC
HEMATOCRIT: 33.6 % — AB (ref 36.0–46.0)
Hemoglobin: 11 g/dL — ABNORMAL LOW (ref 12.0–15.0)
MCH: 30.1 pg (ref 26.0–34.0)
MCHC: 32.7 g/dL (ref 30.0–36.0)
MCV: 91.8 fL (ref 78.0–100.0)
Platelets: 157 10*3/uL (ref 150–400)
RBC: 3.66 MIL/uL — ABNORMAL LOW (ref 3.87–5.11)
RDW: 14.4 % (ref 11.5–15.5)
WBC: 10.6 10*3/uL — AB (ref 4.0–10.5)

## 2017-04-23 LAB — BASIC METABOLIC PANEL
Anion gap: 6 (ref 5–15)
BUN: 25 mg/dL — AB (ref 6–20)
CALCIUM: 8.1 mg/dL — AB (ref 8.9–10.3)
CHLORIDE: 112 mmol/L — AB (ref 101–111)
CO2: 22 mmol/L (ref 22–32)
CREATININE: 0.99 mg/dL (ref 0.44–1.00)
GFR calc Af Amer: 53 mL/min — ABNORMAL LOW (ref >60)
GFR calc non Af Amer: 46 mL/min — ABNORMAL LOW (ref >60)
Glucose, Bld: 90 mg/dL (ref 65–99)
Potassium: 3.8 mmol/L (ref 3.5–5.1)
Sodium: 140 mmol/L (ref 135–145)

## 2017-04-23 MED ORDER — BOOST / RESOURCE BREEZE PO LIQD: 1.0000 | Freq: Three times a day (TID) | ORAL | 0 refills | Status: DC

## 2017-04-23 MED ORDER — VANCOMYCIN 50 MG/ML ORAL SOLUTION: 125.0000 mg | Freq: Four times a day (QID) | ORAL | 0 refills | Status: DC

## 2017-04-23 NOTE — Discharge Instructions (Signed)
Clostridium Difficile Infection   Clostridium difficile (C. difficile or C. diff) infection causes inflammation of the large intestine (colon). This condition can result in damage to the lining of your colon and may lead to another condition called colitis. This infection can be passed from person to person (is contagious).  Follow these instructions at home:  Eating and drinking   · Drink enough fluid to keep your pee (urine) clear or pale yellow.  · Avoid drinking:  ? Milk.  ? Caffeine.  ? Alcohol.  · Follow exact instructions from your doctor about how to get enough fluid in your body (rehydrate).  · Eat small meals often instead of large meals.  Medicines   · Take your antibiotic medicine as told by your doctor. Do not stop taking the antibiotic even if you start to feel better unless your doctor told you to do that.  · Take over-the-counter and prescription medicines only as told by your doctor.  · Do not use medicines to help with watery poop (diarrhea).  General instructions   · Wash your hands fully before you prepare food and after you use the bathroom. Make sure people who live with you also wash their  · hands often.  · Clean the surfaces that you touch. Use a product that contains chlorine bleach.  · Keep all follow-up visits as told by your doctor. This is important.  Contact a doctor if:  · Your symptoms do not get better with treatment.  · Your symptoms get worse with treatment.  · Your symptoms go away and then come back.  · You have a fever.  · You have new symptoms.  Get help right away if:  · You have more pain or tenderness in your belly (abdomen).  · Your poop (stool) is mostly bloody.  · Your poop looks dark black and tarry.  · You cannot eat or drink without throwing up (vomiting).  · You have signs of dehydration, such as:  ? Dark pee, very little pee, or no pee.  ? Cracked lips.  ? Not making tears when you cry.  ? Dry mouth.  ? Sunken eyes.  ? Feeling sleepy.  ? Feeling weak.  ? Feeling  dizzy.  This information is not intended to replace advice given to you by your health care provider. Make sure you discuss any questions you have with your health care provider.  Document Released: 04/24/2009 Document Revised: 12/03/2015 Document Reviewed: 12/29/2014  Elsevier Interactive Patient Education © 2017 [employer]

## 2017-04-23 NOTE — Care Management Important Message (Signed)
Important Message  Patient Details  Name: [name] MRN: [id number] Date of Birth: March 06, 1917   Medicare Important Message Given:  Yes    Elliot Cousin, RN 04/23/2017, 10:58 AM

## 2017-04-23 NOTE — Discharge Summary (Signed)
Physician Discharge Summary  [name] HQI:[id number] DOB: October 31, 1916 DOA: 04/20/2017  PCP: Burton Apley, MD  Admit date: 04/20/2017 Discharge date: 04/23/2017  Recommendations for Outpatient Follow-up:  Minimize antibiotics Continue vanco every 6 hours for 14 days on discahrge  Discharge Diagnoses:  Principal Problem:   Colitis Active Problems:   Hypothyroidism   Diarrhea   HFrEF (heart failure with reduced ejection fraction) (HCC)   Hypokalemia   Elevated bilirubin    Discharge Condition: stable   Diet recommendation: as tolerated   History of present illness:  83 year old female with chronic systolic CHF, hypothyroidism, hypertension who presented with diarrhea and was found to have C.diff colitis.   Hospital Course:  Principal Problem:   C.diff Colitis / Leukocytosis / Diarrhea of infections origin  - Stool PCR C.diff is positive - Continue vanco PO for 14 days on discharge - Stop rocephin and flagyl today - CT scan on admission showed inflammatory/infections colitis  - GI pathogen panel negative   Active Problems:   Hypothyroidism - Continue synthroid     Chronic systolic CHF - Stable resp status - Continue digoxin    Hypokalemia - Due to GI losses - Supplemented     Dyslipidemia - Continue synthroid     UTI - Stop rocephin - UCx with ,ultiple species none predominant     DVT prophylaxis: Lovenox subQ Code Status: DNR?DNI  Family Communication: no family at the bedside     Consultants:   PT - SNF or HHPT  Procedures:   None   Antimicrobials:   Vanco PO for C.diff colitis 10/13 --> for 14 days on discharge  Rocephin and flagyl stopped 04/23/2017    Signed:  Manson Passey, MD  [facility] 04/23/2017, 8:59 AM  Pager #: [phone]  Time spent in minutes: more than 30 minutes   Discharge Exam: Vitals:   04/22/17 1351 04/23/17 0620  BP: 132/69 132/82  Pulse: 80 81  Resp: 18 16  Temp: 97.7 F  (36.5 C) 97.6 F (36.4 C)  SpO2: 95% 95%   Vitals:   04/21/17 2254 04/22/17 0444 04/22/17 1351 04/23/17 0620  BP: 117/71 (!) 148/72 132/69 132/82  Pulse: 87 81 80 81  Resp: Temp: 97.7 F (36.5 C) 98.1 F (36.7 C) 97.7 F (36.5 C) 97.6 F (36.4 C)  TempSrc: Oral Oral Oral Oral  SpO2: 95% 94% 95% 95%  Weight:      Height:        General: Pt is alert, not in acute distress Cardiovascular: Rate controlled, S1/S2 + Respiratory: Clear to auscultation bilaterally, no wheezing, no crackles, no rhonchi Abdominal: Soft, non tender, non distended, bowel sounds +, no guarding Extremities: no edema, no cyanosis, pulses palpable bilaterally DP and PT Neuro: Grossly nonfocal  Discharge Instructions  Discharge Instructions    Call MD for:  persistant nausea and vomiting    Complete by:  As directed    Call MD for:  redness, tenderness, or signs of infection (pain, swelling, redness, odor or green/yellow discharge around incision site)    Complete by:  As directed    Call MD for:  severe uncontrolled pain    Complete by:  As directed    Diet - low sodium heart healthy    Complete by:  As directed    Discharge instructions    Complete by:  As directed    Minimize antibiotics Continue vanco every 6 hours for 14 days on discahrge   Increase  activity slowly    Complete by:  As directed      Allergies as of 04/23/2017      Reactions   Procaine Hcl Hypertension   PASSED OUT      Medication List    STOP taking these medications   cephALEXin 500 MG capsule Commonly known as:  KEFLEX   cyclobenzaprine 5 MG tablet Commonly known as:  FLEXERIL   diphenoxylate-atropine 2.5-0.025 MG tablet Commonly known as:  LOMOTIL     TAKE these medications   acetaminophen 500 MG tablet Commonly known as:  TYLENOL Take 500 mg by mouth every 6 (six) hours as needed for mild pain, moderate pain, fever or headache.   atorvastatin 10 MG tablet Commonly known as:  LIPITOR Take 10  mg by mouth daily.   CENTRUM SILVER PO Take 1 tablet by mouth daily.   cholecalciferol 1000 units tablet Commonly known as:  VITAMIN D Take 1,000 Units by mouth daily.   digoxin 0.125 MG tablet Commonly known as:  DIGITEK TAKE 1 TABLET (0.125 MG TOTAL) BY MOUTH DAILY.   feeding supplement Liqd Take 1 Container by mouth 3 (three) times daily between meals.   furosemide 20 MG tablet Commonly known as:  LASIX Take 1 tablet (20 mg total) by mouth daily.   nitroGLYCERIN 0.2 mg/hr patch Commonly known as:  NITRODUR - Dosed in mg/24 hr PLACE ONE PATCH ONTO SKIN ONCE DAILY AT BEDTIME   nitroGLYCERIN 0.4 MG SL tablet Commonly known as:  NITROSTAT Place 1 tablet (0.4 mg total) under the tongue every 5 (five) minutes as needed for chest pain.   potassium chloride 10 MEQ tablet Commonly known as:  K-DUR TAKE 1 TABLET (10 MEQ TOTAL) BY MOUTH DAILY.   SYNTHROID 50 MCG tablet Generic drug:  levothyroxine Take 50 mcg by mouth daily.   vancomycin 50 mg/mL oral solution Commonly known as:  VANCOCIN Take 2.5 mLs (125 mg total) by mouth every 6 (six) hours.      Follow-up Information    Burton Apley, MD. Schedule an appointment as soon as possible for a visit in 1 week(s).   Specialty:  Internal Medicine Contact information: [address] [place] [address] [phone]            The results of significant diagnostics from this hospitalization (including imaging, microbiology, ancillary and laboratory) are listed below for reference.    Significant Diagnostic Studies: Dg Wrist Complete Left  Result Date: 04/05/2017 CLINICAL DATA:  Patient fell after tripping on sidewalk. EXAM: LEFT WRIST - COMPLETE 3+ VIEW COMPARISON:  None. FINDINGS: There is an acute, closed, comminuted fracture of the distal radial metaphysis and epiphysis extending into the radiocarpal joint at the level of the lunate and also with extension into the distal radioulnar joint. Slight radial  displacement of the main fracture fragment with slight dorsal angulation is noted. Fracture of the ulnar styloid is also seen. Osteoarthritic joint space narrowing of the triscaphe and first CMC joints. No fracture of the carpal bones. Bandaging limits fine bony detail about the wrist and distal forearm. IMPRESSION: 1. Acute, closed, comminuted fracture of the distal radial metaphysis and epiphysis with extension of fracture into the radiocarpal and distal radioulnar joints as above described. Slight radial and dorsal angulation of the main distal radius fracture fragment. 2. Acute ulnar styloid fracture. 3. First CMC and triscaphe joint osteoarthritis. Electronically Signed   By: Tollie Eth M.D.   On: 04/05/2017 20:26   Ct Lumbar Spine Wo Contrast  Result Date: 04/17/2017 CLINICAL DATA:  Severe back pain. Fall 2 weeks ago with left wrist fracture. Back pain is getting worse for 4 days. EXAM: CT LUMBAR SPINE WITHOUT CONTRAST TECHNIQUE: Multidetector CT imaging of the lumbar spine was performed without intravenous contrast administration. Multiplanar CT image reconstructions were also generated. COMPARISON:  None. FINDINGS: Segmentation: Standard Alignment: Moderate levoscoliosis. Mild L1-2 and L2-3 retrolisthesis. Vertebrae: Negative for acute fracture. There is a chronic appearing T12 superior endplate fracture. Paraspinal and other soft tissues: Bilateral renal hilar calcifications that are primarily elongated and appear vascular. Partly seen right upper pole renal cyst. Atherosclerotic calcification of the aorta. Minimally seen sigmoid colon but still showing multiple diverticula. Disc levels: T12- L1: Disc degeneration with vacuum phenomenon. No herniation or impingement L1-L2: Advanced asymmetric rightward disc narrowing with far-lateral predominant endplate spurring. Vacuum phenomenon. Negative facets. Bilateral foraminal stenosis with high-grade impingement on the right. Patent spinal canal L2-L3:  Advanced asymmetric rightward disc narrowing with bulky far-lateral spurring. High-grade right foraminal stenosis. Patent spinal canal. L3-L4: Disc degeneration with vacuum phenomenon. Interspinous degeneration with spinous process spurring. The disc is bulging. No impingement noted. L4-L5: Disc narrowing and circumferential bulging. Asymmetric left facet spurring. Ligamentum flavum thickening and interspinous degeneration. High-grade left foraminal impingement. Moderate triangular narrowing of the thecal sac. L5-S1:Advanced asymmetric left facet arthropathy. The disc is narrowed and bulging. Asymmetric left foraminal impingement due to disc bulge. IMPRESSION: 1. No acute finding. 2. Scoliosis with multilevel advanced disc degeneration and facet arthropathy. 3. L1-2 and L2-3 right foraminal impingement. 4. L4-5 moderate spinal stenosis. 5. L4-5 and L5-S1 left foraminal impingement. Electronically Signed   By: Marnee Spring M.D.   On: 04/17/2017 16:44   Ct Abdomen Pelvis W Contrast  Result Date: 04/20/2017 CLINICAL DATA:  Diarrhea. EXAM: CT ABDOMEN AND PELVIS WITH CONTRAST TECHNIQUE: Multidetector CT imaging of the abdomen and pelvis was performed using the standard protocol following bolus administration of intravenous contrast. CONTRAST:  80mL ISOVUE-300 IOPAMIDOL (ISOVUE-300) INJECTION 61% COMPARISON:  None. FINDINGS: Lower chest:  Basilar atelectasis and/or scarring evident. Hepatobiliary: No focal abnormality within the liver parenchyma. Mild intrahepatic biliary duct dilatation is associated with dilatation of the extrahepatic common bile duct measuring up to 14 mm diameter in the head of pancreas. No ductal stone is evident by CT. Pancreas: Diffuse parenchymal atrophy without dilatation of the main duct. Spleen: No splenomegaly. No focal mass lesion. Adrenals/Urinary Tract: No adrenal nodule or mass. Vascular calcification noted in the hilum of each kidney. 3.7 cm cyst identified upper pole right  kidney. Small cysts in the left kidney measure up to about 14 mm. No evidence for hydroureter. The urinary bladder appears normal for the degree of distention. Stomach/Bowel: Moderate hiatal hernia with about 50% of the stomach in the chest. Duodenum is normally positioned as is the ligament of Treitz. No small bowel wall thickening. No small bowel dilatation. The terminal ileum is normal. The appendix is not visualized. There is diffuse edema and wall thickening in the colon, most prominent in the ascending and left segments. Associated diverticulosis noted in the sigmoid colon. Areas of pericolonic edema/inflammation associated. Vascular/Lymphatic: There is abdominal aortic atherosclerosis without aneurysm. There is no gastrohepatic or hepatoduodenal ligament lymphadenopathy. No intraperitoneal or retroperitoneal lymphadenopathy. No pelvic sidewall lymphadenopathy. Reproductive: The uterus has normal CT imaging appearance. There is no adnexal mass. Other: Small volume intraperitoneal free fluid noted in the para colic gutters and pelvis. Musculoskeletal: A small right groin hernia contains only fat. Bone windows reveal no worrisome lytic or  sclerotic osseous lesions. Degenerative disc disease is most notable at T12-L1, L1-2, and L2-3. IMPRESSION: 1. Colonic wall edema and thickening diffusely but most evident in the right and left segments. Imaging features are compatible with an infectious/inflammatory colitis. 2. Fairly dramatic distention of the gallbladder with intra and extrahepatic biliary duct dilatation. The common bile duct remains dilated to the level of the ampulla. No evidence for common bile duct stone by CT, but MRCP or ERCP may prove helpful to further evaluate, specially in the setting of abnormal LFTs. 3.  Aortic Atherosclerois (ICD10-170.0) 4. Small right groin hernia contains only fat. Electronically Signed   By: Kennith Center M.D.   On: 04/20/2017 16:22   Mr 3d Recon At Scanner  Result  Date: 04/21/2017 CLINICAL DATA:  Abnormal liver function tests. EXAM: MRI ABDOMEN WITHOUT AND WITH CONTRAST (INCLUDING MRCP) TECHNIQUE: Multiplanar multisequence MR imaging of the abdomen was performed both before and after the administration of intravenous contrast. Heavily T2-weighted images of the biliary and pancreatic ducts were obtained, and three-dimensional MRCP images were rendered by post processing. CONTRAST:  12mL MULTIHANCE GADOBENATE DIMEGLUMINE 529 MG/ML IV SOLN COMPARISON:  CT 04/20/2017 FINDINGS: Lower chest:  Lung bases are clear. Hepatobiliary: Mild intrahepatic and moderate extrahepatic biliary duct dilatation. The common hepatic duct measures 12 mm and the common bile duct measures 12 mm. There is fusiform dilatation of the common bile duct to the level of the ampulla. No filling defect within the common bile duct. No clear external compression. The gallbladder is distended to 5 cm. No gallstones identified. No focal hepatic lesion. Pancreas: No pancreatic head lesion identified. Ductal variant anatomy in pancreas divisum anatomy. There is no significant dilatation the pancreatic duct. There is pancreatic atrophy typical for age. Spleen: Normal spleen. Adrenals/urinary tract: Motion degradation on the post-contrast imaging. Adrenal glands grossly normal. Nonenhancing cysts of the kidneys. Stomach/Bowel: Stomach and limited of the small bowel is unremarkable Vascular/Lymphatic: Abdominal aortic normal caliber. No retroperitoneal periportal lymphadenopathy. Musculoskeletal: No aggressive osseous lesion IMPRESSION: 1. Intrahepatic and extrahepatic biliary duct dilatation without obstructing lesion identified. Dilatation of the gallbladder additionally. The bilirubin is normal, therefore favor chronic dilatation. Cannot exclude ampullary lesion but again not favored with normal bilirubin. 2. No obstructing pancreatic lesion identified. Pancreas divisum variant ductal anatomy. 3. Exam is degraded by  respiration. Electronically Signed   By: Genevive Bi M.D.   On: 04/21/2017 20:56   Mr Abdomen Mrcp Vivien Rossetti Contast  Result Date: 04/21/2017 CLINICAL DATA:  Abnormal liver function tests. EXAM: MRI ABDOMEN WITHOUT AND WITH CONTRAST (INCLUDING MRCP) TECHNIQUE: Multiplanar multisequence MR imaging of the abdomen was performed both before and after the administration of intravenous contrast. Heavily T2-weighted images of the biliary and pancreatic ducts were obtained, and three-dimensional MRCP images were rendered by post processing. CONTRAST:  12mL MULTIHANCE GADOBENATE DIMEGLUMINE 529 MG/ML IV SOLN COMPARISON:  CT 04/20/2017 FINDINGS: Lower chest:  Lung bases are clear. Hepatobiliary: Mild intrahepatic and moderate extrahepatic biliary duct dilatation. The common hepatic duct measures 12 mm and the common bile duct measures 12 mm. There is fusiform dilatation of the common bile duct to the level of the ampulla. No filling defect within the common bile duct. No clear external compression. The gallbladder is distended to 5 cm. No gallstones identified. No focal hepatic lesion. Pancreas: No pancreatic head lesion identified. Ductal variant anatomy in pancreas divisum anatomy. There is no significant dilatation the pancreatic duct. There is pancreatic atrophy typical for age. Spleen: Normal spleen. Adrenals/urinary tract: Motion  degradation on the post-contrast imaging. Adrenal glands grossly normal. Nonenhancing cysts of the kidneys. Stomach/Bowel: Stomach and limited of the small bowel is unremarkable Vascular/Lymphatic: Abdominal aortic normal caliber. No retroperitoneal periportal lymphadenopathy. Musculoskeletal: No aggressive osseous lesion IMPRESSION: 1. Intrahepatic and extrahepatic biliary duct dilatation without obstructing lesion identified. Dilatation of the gallbladder additionally. The bilirubin is normal, therefore favor chronic dilatation. Cannot exclude ampullary lesion but again not favored with  normal bilirubin. 2. No obstructing pancreatic lesion identified. Pancreas divisum variant ductal anatomy. 3. Exam is degraded by respiration. Electronically Signed   By: Genevive Bi M.D.   On: 04/21/2017 20:56    Microbiology: Recent Results (from the past 240 hour(s))  Urine culture     Status: Abnormal   Collection Time: 04/17/17  3:50 PM  Result Value Ref Range Status   Specimen Description URINE, RANDOM  Final   Special Requests NONE  Final   Culture MULTIPLE SPECIES PRESENT, SUGGEST RECOLLECTION (A)  Final   Report Status 04/19/2017 FINAL  Final  Culture, Urine     Status: Abnormal   Collection Time: 04/20/17 11:20 AM  Result Value Ref Range Status   Specimen Description URINE, RANDOM  Final   Special Requests NONE  Final   Culture MULTIPLE SPECIES PRESENT, SUGGEST RECOLLECTION (A)  Final   Report Status 04/22/2017 FINAL  Final  Culture, blood (routine x 2)     Status: None (Preliminary result)   Collection Time: 04/20/17  7:01 PM  Result Value Ref Range Status   Specimen Description BLOOD RIGHT ARM  Final   Special Requests   Final    BOTTLES DRAWN AEROBIC ONLY Blood Culture adequate volume   Culture   Final    NO GROWTH 1 DAY Performed at [facility], [address], [place], [place] [address]    Report Status PENDING  Incomplete  Culture, blood (routine x 2)     Status: None (Preliminary result)   Collection Time: 04/20/17  7:04 PM  Result Value Ref Range Status   Specimen Description BLOOD RIGHT ANTECUBITAL  Final   Special Requests   Final    BOTTLES DRAWN AEROBIC ONLY Blood Culture adequate volume   Culture   Final    NO GROWTH 1 DAY Performed at [facility], [address], [place], [place] [address]    Report Status PENDING  Incomplete  C difficile quick scan w PCR reflex     Status: Abnormal   Collection Time: 04/21/17  3:22 PM  Result Value Ref Range Status   C Diff antigen POSITIVE (A) NEGATIVE Final   C Diff toxin NEGATIVE NEGATIVE  Final   C Diff interpretation Results are indeterminate. See PCR results.  Final  Gastrointestinal Panel by PCR , Stool     Status: None   Collection Time: 04/21/17  3:22 PM  Result Value Ref Range Status   Campylobacter species NOT DETECTED NOT DETECTED Final   Plesimonas shigelloides NOT DETECTED NOT DETECTED Final   Salmonella species NOT DETECTED NOT DETECTED Final   Yersinia enterocolitica NOT DETECTED NOT DETECTED Final   Vibrio species NOT DETECTED NOT DETECTED Final   Vibrio cholerae NOT DETECTED NOT DETECTED Final   Enteroaggregative E coli (EAEC) NOT DETECTED NOT DETECTED Final   Enteropathogenic E coli (EPEC) NOT DETECTED NOT DETECTED Final   Enterotoxigenic E coli (ETEC) NOT DETECTED NOT DETECTED Final   Shiga like toxin producing E coli (STEC) NOT DETECTED NOT DETECTED Final   Shigella/Enteroinvasive E coli (  EIEC) NOT DETECTED NOT DETECTED Final   Cryptosporidium NOT DETECTED NOT DETECTED Final   Cyclospora cayetanensis NOT DETECTED NOT DETECTED Final   Entamoeba histolytica NOT DETECTED NOT DETECTED Final   Giardia lamblia NOT DETECTED NOT DETECTED Final   Adenovirus F40/41 NOT DETECTED NOT DETECTED Final   Astrovirus NOT DETECTED NOT DETECTED Final   Norovirus GI/GII NOT DETECTED NOT DETECTED Final   Rotavirus A NOT DETECTED NOT DETECTED Final   Sapovirus (I, II, IV, and V) NOT DETECTED NOT DETECTED Final  Clostridium Difficile by PCR     Status: Abnormal   Collection Time: 04/21/17  3:22 PM  Result Value Ref Range Status   Toxigenic C Difficile by pcr POSITIVE (A) NEGATIVE Final    Comment: Positive for toxigenic C. difficile with little to no toxin production. Only treat if clinical presentation suggests symptomatic illness. Performed at [facility], [address], [place], [place] [address]      Labs: Basic Metabolic Panel:  Recent Labs Lab 04/17/17 1558 04/20/17 0928 04/21/17 0413 04/22/17 0411 04/23/17 0432  NA 138 139 138 140 140  K 3.5 3.3*  3.9 3.9 3.8  CL 103 105 107 111 112*  CO2 26 23 20* 20* 22  GLUCOSE 92 101* 102* 107* 90  BUN 28* 37* 33* 28* 25*  CREATININE 1.06* 1.12* 0.98 0.98 0.99  CALCIUM 8.6* 8.9 8.1* 8.2* 8.1*   Liver Function Tests:  Recent Labs Lab 04/17/17 1558 04/20/17 0928 04/21/17 0413  AST 21 32 43*  ALT 13* 25 30  ALKPHOS 59 97 80  BILITOT 2.1* 1.8* 1.1  PROT 6.0* 6.1* 5.0*  ALBUMIN 3.4* 3.4* 2.6*    Recent Labs Lab 04/17/17 1558  LIPASE 29   No results for input(s): AMMONIA in the last 168 hours. CBC:  Recent Labs Lab 04/17/17 1558 04/20/17 0928 04/21/17 0413 04/22/17 0411 04/23/17 0432  WBC 12.9* 14.9* 12.1* 13.8* 10.6*  NEUTROABS 9.7* 11.1*  --   --   --   HGB 11.9* 13.4 11.5* 11.2* 11.0*  HCT 36.3 40.3 35.2* 34.8* 33.6*  MCV 92.6 90.8 91.7 92.3 91.8  PLT 156 183 156 154 157   Cardiac Enzymes: No results for input(s): CKTOTAL, CKMB, CKMBINDEX, TROPONINI in the last 168 hours. BNP: BNP (last 3 results) No results for input(s): BNP in the last 8760 hours.  ProBNP (last 3 results) No results for input(s): PROBNP in the last 8760 hours.  CBG: No results for input(s): GLUCAP in the last 168 hours.

## 2017-04-23 NOTE — Care Management Note (Signed)
Case Management Note  Patient Details  Name: ARMINE RIZZOLO MRN: [id number] Date of Birth: [birth year]  Subjective/Objective:   Coliits, hypothyroidism                 Action/Plan: Discharge Planning: NCM spoke to pt and dtr, Jackie at bedside. Pt is active with [facility]. Contacted [facility] to make aware HHPT/OT and aide was added. Pt had HHRN for dressing changes. Pt has [facility] at home.   PCP Burton Apley MD  Expected Discharge Date:  04/23/17               Expected Discharge Plan:  Home w Home Health Services  In-House Referral:  Clinical Social Work  Discharge planning Services  CM Consult  Post Acute Care Choice:  Home Health, Resumption of Svcs/PTA Provider Choice offered to:  Adult Children  DME Arranged:  N/A DME Agency:  NA  HH Arranged:  PT, RN, OT, Nurse's Aide HH Agency:  Other - See comment  Status of Service:  Completed, signed off  If discussed at Long Length of Stay Meetings, dates discussed:    Additional Comments:  Elliot Cousin, RN 04/23/2017, 11:16 AM

## 2017-04-24 NOTE — Progress Notes (Signed)
Pt is medically stable for discharge. She is at high risk of fall and would benefit from SNF placement. Appreciate social work assistance with placement. Please refer to discharge summary completed 04/23/2017.  Manson Passey [facility] [phone]

## 2017-04-24 NOTE — Clinical Social Work Note (Signed)
Clinical Social Work Assessment  Patient Details  Name: [name] MRN: [id number] Date of Birth: April 07, 1917  Date of referral:  04/24/17               Reason for consult:  Facility Placement                Permission sought to share information with:  Facility Medical sales representative, Family Supports Permission granted to share information::  Yes, Verbal Permission Granted  Name::     [place]; Helmut Muster   Agency::     Relationship::  daughters  Contact Information:     Housing/Transportation Living arrangements for the past 2 months:  Single Family Home Source of Information:  Patient, Adult Children Patient Interpreter Needed:  None Criminal Activity/Legal Involvement Pertinent to Current Situation/Hospitalization:  No - Comment as needed Significant Relationships:  Adult Children Lives with:  Self Do you feel safe going back to the place where you live?  Yes (PT recommending SNF) Need for family participation in patient care:  Yes (Comment)  Care giving concerns:  Patient from home alone. Patient reported that she is independent with ADLs at baseline and has been experiencing diarrhea which caused her to be weak. Patient recently experienced left wrist fracture. PT recommending SNF.    Social Worker assessment / plan:  CSW spoke with patient at bedside regarding PT recommendation for SNF for ST rehab. Patient reported that she has talked to her daughters and is agreeable to SNF for ST rehab. Patient reported that she would prefer to go home but is agreeable to SNF so she can regain use of her arm. Patient granted CSW verbal permission to speak with daughters about SNF placement. CSW spoke with patient's daughter regarding PT recommendation for SNF Helmut Muster [phone]), patient's daughter agreeable to SNF for rehab. CSW contacted patient's daughter Almyra Free [phone]), no answer.   CSW will complete FL2 and provide bed offers.  Employment status:  Retired Health and safety inspector:   [employer] PT Recommendations:  Skilled Holiday representative, 24 Hour Supervision Information / Referral to community resources:  Skilled Nursing Facility  Patient/Family's Response to care:  Patient/patient's daughter agreeable to SNF.   Patient/Family's Understanding of and Emotional Response to Diagnosis, Current Treatment, and Prognosis:  Patient and patient's daughter verbalized understanding of patient's diagnosis. Patient presented calm and was initially apprehensive about SNF reporting that she wants to return home. CSW explained PT recommendation, patient verbalized understanding about needing rehab to regain strength. Patient motivated to regain strength and return home to live independently.   Emotional Assessment Appearance:  Appears younger than stated age Attitude/Demeanor/Rapport:  Other (Open; Cooperative) Affect (typically observed):  Calm Orientation:  Oriented to Self, Oriented to Place, Oriented to  Time, Oriented to Situation Alcohol / Substance use:  Not Applicable Psych involvement (Current and /or in the community):  No (Comment)  Discharge Needs  Concerns to be addressed:  Care Coordination Readmission within the last 30 days:  No Current discharge risk:  None Barriers to Discharge:  No Barriers Identified   Antionette Poles, LCSW 04/24/2017, 4:26 PM

## 2017-04-24 NOTE — NC FL2 (Signed)
[place] MEDICAID FL2 LEVEL OF CARE SCREENING TOOL     IDENTIFICATION  Patient Name: [name] Birthdate: Aug 24, 1916 Sex: female Admission Date (Current Location): 04/20/2017  [facility] and [place] Number:  Producer, television/film/video and Address:  [facility],  [address] [place], [place] [address]      Provider Number: [id number]  Attending Physician Name and Address:  Alison Murray, MD  Relative Name and Phone Number:       Current Level of Care: Hospital Recommended Level of Care: Skilled Nursing Facility Prior Approval Number:    Date Approved/Denied:   PASRR Number: [id number]  Discharge Plan: SNF    Current Diagnoses: Patient Active Problem List   Diagnosis Date Noted  . Diarrhea 04/20/2017  . Colitis 04/20/2017  . HFrEF (heart failure with reduced ejection fraction) (HCC) 04/20/2017  . Hypokalemia 04/20/2017  . Elevated bilirubin 04/20/2017  . CVA (cerebral infarction) 08/04/2011  . Ischemic cardiomyopathy   . Hypothyroidism   . Hyperlipemia     Orientation RESPIRATION BLADDER Height & Weight     Self, Time, Situation, Place  Normal Continent Weight: 130 lb (59 kg) Height:   (167.6 cm)  BEHAVIORAL SYMPTOMS/MOOD NEUROLOGICAL BOWEL NUTRITION STATUS      Continent Diet (regular)  AMBULATORY STATUS COMMUNICATION OF NEEDS Skin   Limited Assist Verbally Other (Comment) (Incision(Closed)10/12/18WristLeft; Compression wrap changed Monday,Wednesday, Friday)                       Personal Care Assistance Level of Assistance  Bathing, Feeding, Dressing Bathing Assistance: Limited assistance Feeding assistance: Independent Dressing Assistance: Limited assistance     Functional Limitations Info  Speech, Hearing, Sight Sight Info: Adequate Hearing Info: Impaired Speech Info: Adequate    SPECIAL CARE FACTORS FREQUENCY  PT (By licensed PT), OT (By licensed OT)     PT Frequency: 5x OT Frequency: 5x            Contractures  Contractures Info: Not present    Additional Factors Info  Isolation Precautions, Code Status, Allergies Code Status Info: DNR Allergies Info: Allergies:  Procaine Hcl     Isolation Precautions Info: Entric Precautions     Current Medications (04/24/2017):  This is the current hospital active medication list Current Facility-Administered Medications  Medication Dose Route Frequency Provider Last Rate Last Dose  . acetaminophen (TYLENOL) tablet 500 mg  500 mg Oral Q6H PRN Zigmund Daniel., MD   500 mg at 04/20/17 2348  . atorvastatin (LIPITOR) tablet 10 mg  10 mg Oral Daily Zigmund Daniel., MD   10 mg at 04/24/17 1041  . cefTRIAXone (ROCEPHIN) 2 g in dextrose 5 % 50 mL IVPB  2 g Intravenous Q24H Zigmund Daniel., MD   Stopped at 04/23/17 2050  . digoxin (LANOXIN) tablet 0.125 mg  0.125 mg Oral Daily Zigmund Daniel., MD   0.125 mg at 04/24/17 1041  . enoxaparin (LOVENOX) injection 30 mg  30 mg Subcutaneous Q24H Zigmund Daniel., MD   30 mg at 04/23/17 2019  . feeding supplement (BOOST / RESOURCE BREEZE) liquid 1 Container  1 Container Oral TID BM Zigmund Daniel., MD   1 Container at 04/24/17 1041  . levothyroxine (SYNTHROID, LEVOTHROID) tablet 50 mcg  50 mcg Oral QAC breakfast Zigmund Daniel., MD   50 mcg at 04/24/17 0751  . metroNIDAZOLE (FLAGYL) IVPB 500 mg  500 mg Intravenous Q8H Powell, A  Charlyn Minerva., MD 100 mL/hr at 04/24/17 1207 500 mg at 04/24/17 1207  . nitroGLYCERIN (NITRODUR - Dosed in mg/24 hr) patch 0.1 mg  0.1 mg Transdermal Q M,W,F-2000 Alwyn Ren, MD   0.1 mg at 04/21/17 2002  . potassium chloride (K-DUR,KLOR-CON) CR tablet 10 mEq  10 mEq Oral Daily Zigmund Daniel., MD   10 mEq at 04/24/17 1041  . vancomycin (VANCOCIN) 50 mg/mL oral solution 125 mg  125 mg Oral Q6H Alison Murray, MD   125 mg at 04/24/17 1207     Discharge Medications: Please see discharge summary for a list of discharge medications.  Relevant  Imaging Results:  Relevant Lab Results:   Additional Information SSN [id number]  Antionette Poles, LCSW

## 2017-04-24 NOTE — Progress Notes (Signed)
Called report to [employer] at [place].

## 2017-04-24 NOTE — Clinical Social Work Placement (Signed)
Patient received and accepted bed offer at [facility]. Facility aware of patient's discharge and confirmed bed offer. PTAR contacted, family notified. Patient's RN can call report to [phone], packet complete. CSW signing off, no other needs identified at this time.  CLINICAL SOCIAL WORK PLACEMENT  NOTE  Date:  04/24/2017  Patient Details  Name: [name] MRN: [id number] Date of Birth: 1916-09-09  Clinical Social Work is seeking post-discharge placement for this patient at the Skilled  Nursing Facility level of care (*CSW will initial, date and re-position this form in  chart as items are completed):  Yes   Patient/family provided with [place] Clinical Social Work Department's list of facilities offering this level of care within the geographic area requested by the patient (or if unable, by the patient's family).  Yes   Patient/family informed of their freedom to choose among providers that offer the needed level of care, that participate in Medicare, Medicaid or managed care program needed by the patient, have an available bed and are willing to accept the patient.  Yes   Patient/family informed of [facility]'s ownership interest in [facility] and [facility], as well as of the fact that they are under no obligation to receive care at these facilities.  PASRR submitted to EDS on 04/24/17     PASRR number received on 04/24/17     Existing PASRR number confirmed on       FL2 transmitted to all facilities in geographic area requested by pt/family on 04/24/17     FL2 transmitted to all facilities within larger geographic area on       Patient informed that his/her managed care company has contracts with or will negotiate with certain facilities, including the following:        Yes   Patient/family informed of bed offers received.  Patient chooses bed at [facility]     Physician recommends and patient chooses bed at      Patient to be  transferred to [facility] on 04/24/17.  Patient to be transferred to facility by PTAR     Patient family notified on 04/24/17 of transfer.  Name of family member notified:  [name]     PHYSICIAN       Additional Comment:    _______________________________________________ Antionette Poles, LCSW 04/24/2017, 3:29 PM

## 2017-04-25 ENCOUNTER — Encounter: Payer: Self-pay | Admitting: Internal Medicine

## 2017-04-25 ENCOUNTER — Non-Acute Institutional Stay (SKILLED_NURSING_FACILITY): Payer: Medicare Other | Admitting: Internal Medicine

## 2017-04-25 DIAGNOSIS — R197 Diarrhea, unspecified: Secondary | ICD-10-CM

## 2017-04-25 DIAGNOSIS — I255 Ischemic cardiomyopathy: Secondary | ICD-10-CM

## 2017-04-25 NOTE — Assessment & Plan Note (Signed)
Reorder NitroDur patch

## 2017-04-25 NOTE — Assessment & Plan Note (Signed)
Continue oral vancomycin for 14 days postdischarge

## 2017-04-25 NOTE — Patient Instructions (Signed)
See assessment and plan under each diagnosis in the problem list and acutely for this visit 

## 2017-04-25 NOTE — Progress Notes (Signed)
NURSING HOME LOCATION:  [facility] ROOM NUMBER:  [id number]  CODE STATUS:  Full Code  PCP:  Burton Apley, MD  [address] [place] [address]   This is a comprehensive admission note to [facility] performed on this date less than 30 days from date of admission. Included are preadmission medical/surgical history;reconciled medication list; family history; social history and comprehensive review of systems.  Corrections and additions to the records were documented . Comprehensive physical exam was also performed. Additionally a clinical summary was entered for each active diagnosis pertinent to this admission in the Problem List to enhance continuity of care.  HPI: The patient was hospitalized 10/11-10/14/18 for C. difficile colitis, presenting as diarrhea. Approximately 2 weeks prior to admission she had fallen, tripping on her driveway and fracturing her wrist . Denied were any change in heart rhythm or rate prior to the event. There was no associated chest pain or shortness of breath . Also specifically denied prior to the episode were headache, limb weakness, tingling, or numbness. No seizure activity noted.  For a  large skin tear she was treated  prophylactically with Keflex for 7 days. Orthopedic surgery was deferred as the patient has a history of cardiac arrest perioperatively in the past. Also 2 days prior to admission she received a single dose of fosfomycin for "signs of infection". Urine culture revealed multiple species. Stool studies for C. difficile were positive. Initially she received Rocephin and Flagyl for CT scan findings suggesting inflammatory vs infectious colitis She was transitioned to PO vancomycin for 14 days at discharge. Labs at discharge revealed BUN 25 and creatinine 0.99. GFR was 53. She exhibited normochromic, normocytic anemia with hemoglobin of 11/hematocrit 33.6. There was slight progression of anemia during her  hospitalization.  Past medical and surgical history: Includes hypothyroidism, chronic systolic failure with reduced ejection fraction, stroke, subendocardial myocardial infarction, dyslipidemia and hypertension. She had a cardiac arrest intraoperatively @ ovarian surgery..  Social history: Quit smoking 1970. She does not drink.  Family history: Noncontributory due to age  Review of systems: She states she has no diarrhea at this time and no other GI symptoms. Her major concern is not receiving a patch that her cardiologist prescribed.  Constitutional: No fever,significant weight change, fatigue  Eyes: No redness, discharge, pain, vision change ENT/mouth: No nasal congestion,  purulent discharge, earache,change in hearing ,sore throat  Cardiovascular: No chest pain, palpitations,paroxysmal nocturnal dyspnea, claudication, edema  Respiratory: No cough, sputum production,hemoptysis, DOE , significant snoring,apnea  Gastrointestinal: No heartburn,dysphagia,abdominal pain, nausea / vomiting,rectal bleeding, melena,change in bowels Genitourinary: No dysuria,hematuria, pyuria,  incontinence, nocturia Musculoskeletal: No joint stiffness, joint swelling, weakness,pain Dermatologic: No rash, pruritus, change in appearance of skin Neurologic: No dizziness,headache,syncope, seizures, numbness , tingling Psychiatric: No significant anxiety , depression, insomnia, anorexia Endocrine: No change in hair/skin/ nails, excessive thirst, excessive hunger, excessive urination  Hematologic/lymphatic: No significant bruising, lymphadenopathy,abnormal bleeding Allergy/immunology: No itchy/ watery eyes, significant sneezing, urticaria, angioedema  Physical exam:  Pertinent or positive findings:Appears younger than stated age. She does have decreased hearing on the left. Ectropion of the left lower lid is present without evidence of conjunctivitis. She has slight hirsutism over the chin. She has an upper partial.  There is plaque formation of the lower teeth. Her heart rhythm is variable with respirations. The left upper extremity is splinted & wrapped with  Ace bandage. Pedal pulses are decreased. She has trace-1/2+ edema @ sock line.  General appearance:Adequately nourished; no acute distress , increased  work of breathing is present.   Lymphatic: No lymphadenopathy about the head, neck, axilla . Eyes: No conjunctival inflammation or lid edema is present. There is no scleral icterus. Ears:  External ear exam shows no significant lesions or deformities.   Nose:  External nasal examination shows no deformity or inflammation. Nasal mucosa are pink and moist without lesions ,exudates Oral exam: lips and gums are healthy appearing.There is no oropharyngeal erythema or exudate . Neck:  No thyromegaly, masses, tenderness noted.    Heart:  No gallop, murmur, click, rub .  Lungs:Chest clear to auscultation without wheezes, rhonchi,rales , rubs. Abdomen:Bowel sounds are normal. Abdomen is soft and nontender with no organomegaly, hernias,masses. GU: deferred  Extremities:  No cyanosis, clubbing Neurologic exam : Skin: Warm & dry w/o tenting. No significant lesions or rash.  See clinical summary under each active problem in the Problem List with associated updated therapeutic plan

## 2017-04-26 LAB — CULTURE, BLOOD (ROUTINE X 2)
CULTURE: NO GROWTH
Culture: NO GROWTH
Special Requests: ADEQUATE
Special Requests: ADEQUATE

## 2017-05-16 ENCOUNTER — Encounter: Payer: Self-pay | Admitting: Nurse Practitioner

## 2017-05-16 ENCOUNTER — Encounter (INDEPENDENT_AMBULATORY_CARE_PROVIDER_SITE_OTHER): Payer: Self-pay

## 2017-05-16 ENCOUNTER — Ambulatory Visit (INDEPENDENT_AMBULATORY_CARE_PROVIDER_SITE_OTHER): Payer: Medicare Other | Admitting: Nurse Practitioner

## 2017-05-16 VITALS — BP 120/60 | HR 76 | Ht 66.0 in | Wt 123.0 lb

## 2017-05-16 DIAGNOSIS — I255 Ischemic cardiomyopathy: Secondary | ICD-10-CM | POA: Diagnosis not present

## 2017-05-16 DIAGNOSIS — I2589 Other forms of chronic ischemic heart disease: Secondary | ICD-10-CM

## 2017-05-16 NOTE — Progress Notes (Signed)
CARDIOLOGY OFFICE NOTE  Date:  05/16/2017    [name] Date of Birth: 09-28-1916 Medical Record #[id number]  PCP:  Burton Apley, MD  Cardiologist:  Tyrone Sage Hochrein    Chief Complaint  Patient presents with  . Cardiomyopathy    Follow up visit    History of Present Illness: [name] is a 83 y.o. female who presents today for a 8 month check. Seen for Dr. Antoine Poche.Former patient of Dr. Ronnald Nian and typically follows with me.  She has an ischemic CM and has beenmanaged conservatively for many years. EF was 25 to 30% per echo back in March of 2012. Other issues include prior stroke in December of 2012. She was previously on chronic Plavix. She has a chronic LBBB, hypothyroidism and HLD.   Last seen here in March and was doing ok. Memory starting to fail. Cardiac status surprisingly ok. General decline noted. Had had a fall.   Admitted last month with Cdiff colitis - looks like she was sent to SNF.   Comes in today. Here alone. Dropped off by transportation. She has had another fall. Says she broke her arm - it seems to be splinted. This was prior to the admission for the Cdiff which appears to be due to antibiotic use. Seems to be doing ok. Wanting to go home. Getting "some" therapy. Says she can walk. Balance is quite poor. No more diarrhea. No chest pain. Says her breathing is ok. She says her memory is "not as sharp" and really does not give much history today.   Past Medical History:  Diagnosis Date  . Advanced age   . Cardiac arrest [facility]) 2007   Occurred during planned ovarian surgery that resolved spontaneously after her stomach was deflated.   . CVA (cerebral infarction) Oct 2010   "light stroke". Plavix started.  . Grief    from husband's death  . Hyperlipemia   . Hypertension   . Hypothyroidism   . Ischemic cardiomyopathy June 2009   managed medically; EF is 20 to 25%  . Left bundle branch block   . Left heart failure (HCC)    EF is  20 to 25%; She is managed conservatively  . Stroke (HCC)   . Subendocardial myocardial infarction [facility]) June 2009    Past Surgical History:  Procedure Laterality Date  . CATARACT EXTRACTION  09/18/2012   right eye  . DILATION AND CURETTAGE OF UTERUS    . OVARIAN CYST REMOVAL     2007-2008     Medications: Current Meds  Medication Sig  . acetaminophen (TYLENOL) 500 MG tablet Take 500 mg by mouth every 6 (six) hours as needed for mild pain, moderate pain, fever or headache.  Marland Kitchen atorvastatin (LIPITOR) 10 MG tablet Take 10 mg by mouth daily.  . cholecalciferol (VITAMIN D) 1000 units tablet Take 1,000 Units by mouth daily.  . digoxin (DIGITEK) 0.125 MG tablet TAKE 1 TABLET (0.125 MG TOTAL) BY MOUTH DAILY.  . furosemide (LASIX) 20 MG tablet Take 1 tablet (20 mg total) by mouth daily.  . Multiple Vitamins-Minerals (CENTRUM SILVER PO) Take 1 tablet by mouth daily.  . nitroGLYCERIN (NITRODUR - DOSED IN MG/24 HR) 0.2 mg/hr patch PLACE ONE PATCH ONTO SKIN ONCE DAILY AT BEDTIME  . nitroGLYCERIN (NITROSTAT) 0.4 MG SL tablet Place 1 tablet (0.4 mg total) under the tongue every 5 (five) minutes as needed for chest pain.  . potassium chloride (K-DUR) 10 MEQ tablet TAKE 1 TABLET (10 MEQ  TOTAL) BY MOUTH DAILY.  . SYNTHROID 50 MCG tablet Take 50 mcg by mouth daily.     Allergies: Allergies  Allergen Reactions  . Procaine Hcl Hypertension    PASSED OUT    Social History: The patient  reports that she quit smoking about 48 years ago. she has never used smokeless tobacco. She reports that she does not drink alcohol or use drugs.   Family History: The patient's family history includes Aneurysm in her father; Heart attack in her mother; Heart defect in her mother.   Review of Systems: Please see the history of present illness.   Otherwise, the review of systems is positive for none.   All other systems are reviewed and negative.   Physical Exam: VS:  BP 120/60 (BP Location: Left Arm, Patient  Position: Sitting, Cuff Size: Normal)   Pulse 76   Ht 5\' 6"  (1.676 m)   Wt 123 lb (55.8 kg)   SpO2 94% Comment: at rest  BMI 19.85 kg/m  .  BMI Body mass index is 19.85 kg/m.  Wt Readings from Last 3 Encounters:  05/16/17 123 lb (55.8 kg)  04/25/17 130 lb 1.1 oz (59 kg)  04/21/17 130 lb (59 kg)    General: Elderly female. Alert and in no acute distress.  She has lost 9 pounds since last visit with me. Looks chronically ill.  HEENT: Normal. Missing teeth.  Neck: Supple, no JVD, carotid bruits, or masses noted.  Cardiac: Regular rate and rhythm. Soft S3. No edema.  Respiratory:  Lungs are clear to auscultation bilaterally with normal work of breathing.  GI: Soft and nontender.  MS: No deformity or atrophy. Gait not tested Skin: Warm and dry. Color is normal.  Neuro:  Strength and sensation are intact and no gross focal deficits noted.  Psych: Alert, appropriate and with normal affect. Memory seems to be failing.    LABORATORY DATA:  EKG:  EKG is not ordered today.  Lab Results  Component Value Date   WBC 10.6 (H) 04/23/2017   HGB 11.0 (L) 04/23/2017   HCT 33.6 (L) 04/23/2017   PLT 157 04/23/2017   GLUCOSE 90 04/23/2017   CHOL 171 09/16/2015   TRIG 160 (H) 09/16/2015   HDL 56 09/16/2015   LDLCALC 83 09/16/2015   ALT 30 04/21/2017   AST 43 (H) 04/21/2017   NA 140 04/23/2017   K 3.8 04/23/2017   CL 112 (H) 04/23/2017   CREATININE 0.99 04/23/2017   BUN 25 (H) 04/23/2017   CO2 22 04/23/2017   TSH 2.720 10/03/2016   INR 0.99 11/10/2014   HGBA1C  12/20/2007    5.7 (NOTE)   The ADA recommends the following therapeutic goals for glycemic   control related to Hgb A1C measurement:   Goal of Therapy:   < 7.0% Hgb A1C   Action Suggested:  > 8.0% Hgb A1C   Ref:  Diabetes Care, 22, Suppl. 1, 1999     BNP (last 3 results) No results for input(s): BNP in the last 8760 hours.  ProBNP (last 3 results) No results for input(s): PROBNP in the last 8760 hours.   Other Studies  Reviewed Today:  Echo Study Conclusions from 2012  - Left ventricle: The cavity size was normal. Wall thickness was normal. Systolic function was severely reduced. The estimated ejection fraction was in the range of 25% to 30%. There is akinesis of the anteroseptal myocardium. There is akinesis of the apical myocardium. There is akinesis of the mid-distal inferior  myocardium. Doppler parameters are consistent with abnormal left ventricular relaxation (grade 1 diastolic dysfunction). - Ventricular septum: Septal motion showed abnormal function and dyssynergy. - Aortic valve: Trivial regurgitation. - Mitral valve: Mild regurgitation. - Left atrium: The atrium was mildly dilated.  Assessment/Plan:  1. Recent admission for Cdiff colitis - she denies any further diarrhea.   2. Advanced age - seems to be in a general decline.   3. Chronic ICM - EF of 25 to 30% - has been managed conservatively for many years - compensated. On very little of a heart failure regimen. Would favor continued conservative management  4. HLD - we had stopped her statin in the past - now back on - will defer to PCP but doubt this is needed  5. Chronic LBBB  6. Prior stroke - only on aspirin.   7. HTN - BP is fine.   8. Hypothyroid - on replacement therapy   Current medicines are reviewed with the patient today.  The patient does not have concerns regarding medicines other than what has been noted above.  The following changes have been made:  See above.  Labs/ tests ordered today include:   No orders of the defined types were placed in this encounter.    Disposition:   FU with me in 6 months.   Patient is agreeable to this plan and will call if any problems develop in the interim.   SignedNorma Fredrickson, NP  05/16/2017 1:44 PM  [facility] [address] [place], [place]  [address] Phone: [phone] Fax: [phone]

## 2017-05-16 NOTE — Patient Instructions (Signed)
We will be checking the following labs today - NONE   Medication Instructions:    Continue with your current medicines.     Testing/Procedures To Be Arranged:  N/A  Follow-Up:   See me in about 6 months    Other Special Instructions:   N/A    If you need a refill on your cardiac medications before your next appointment, please call your pharmacy.   Call the [facility] office at [phone] if you have any questions, problems or concerns.      

## 2017-05-25 ENCOUNTER — Encounter: Payer: Self-pay | Admitting: Adult Health

## 2017-05-25 ENCOUNTER — Non-Acute Institutional Stay (SKILLED_NURSING_FACILITY): Payer: Medicare Other | Admitting: Adult Health

## 2017-05-25 DIAGNOSIS — I5022 Chronic systolic (congestive) heart failure: Secondary | ICD-10-CM

## 2017-05-25 DIAGNOSIS — K529 Noninfective gastroenteritis and colitis, unspecified: Secondary | ICD-10-CM | POA: Diagnosis not present

## 2017-05-25 DIAGNOSIS — E876 Hypokalemia: Secondary | ICD-10-CM

## 2017-05-25 DIAGNOSIS — E039 Hypothyroidism, unspecified: Secondary | ICD-10-CM | POA: Diagnosis not present

## 2017-05-25 DIAGNOSIS — N390 Urinary tract infection, site not specified: Secondary | ICD-10-CM

## 2017-05-25 DIAGNOSIS — E785 Hyperlipidemia, unspecified: Secondary | ICD-10-CM | POA: Diagnosis not present

## 2017-05-25 DIAGNOSIS — I255 Ischemic cardiomyopathy: Secondary | ICD-10-CM | POA: Diagnosis not present

## 2017-05-25 DIAGNOSIS — R5381 Other malaise: Secondary | ICD-10-CM

## 2017-05-25 DIAGNOSIS — S62102S Fracture of unspecified carpal bone, left wrist, sequela: Secondary | ICD-10-CM

## 2017-05-25 MED ORDER — FUROSEMIDE 20 MG PO TABS: 20.0000 mg | ORAL_TABLET | Freq: Every day | ORAL | 0 refills | Status: DC

## 2017-05-25 MED ORDER — SYNTHROID 50 MCG PO TABS: 50.0000 ug | ORAL_TABLET | Freq: Every day | ORAL | 0 refills | Status: DC

## 2017-05-25 MED ORDER — ATORVASTATIN CALCIUM 10 MG PO TABS: 10.0000 mg | ORAL_TABLET | Freq: Every day | ORAL | 0 refills | Status: DC

## 2017-05-25 MED ORDER — POTASSIUM CHLORIDE ER 10 MEQ PO TBCR: EXTENDED_RELEASE_TABLET | ORAL | 0 refills | Status: DC

## 2017-05-25 MED ORDER — NITROGLYCERIN 0.4 MG SL SUBL: 0.4000 mg | SUBLINGUAL_TABLET | SUBLINGUAL | 0 refills | Status: DC | PRN

## 2017-05-25 MED ORDER — DIGOXIN 125 MCG PO TABS: ORAL_TABLET | ORAL | 0 refills | Status: DC

## 2017-05-25 MED ORDER — NITROGLYCERIN 0.2 MG/HR TD PT24: MEDICATED_PATCH | TRANSDERMAL | 0 refills | Status: DC

## 2017-05-25 NOTE — Progress Notes (Signed)
DATE:  05/25/2017   MRN:  [id number]  BIRTHDAY: 03/02/1917  Facility:  Nursing Home Location:  [facility] Room Number: 216-A  LEVEL OF CARE:  SNF (31)  Contact Information    Name Relation Home Work Mobile   Bolckowhompson,Alicia Daughter [phone]  [phone]       Code Status History    Date Active Date Inactive Code Status Order ID Comments User Context   04/20/2017 17:37 04/24/2017 21:36 DNR [id number]  Zigmund DanielPowell, A Caldwell Jr., MD Inpatient    Questions for Most Recent Historical Code Status (Order [id number])    Question Answer Comment   In the event of cardiac or respiratory ARREST Do not call a "code blue"    In the event of cardiac or respiratory ARREST Do not perform Intubation, CPR, defibrillation or ACLS    In the event of cardiac or respiratory ARREST Use medication by any route, position, wound care, and other measures to relive pain and suffering. May use oxygen, suction and manual treatment of airway obstruction as needed for comfort.        Chief Complaint  Patient presents with  . Discharge Note    Discharging to home 11/16 with home health services    HISTORY OF PRESENT ILLNESS:  This is a 89-YO female seen for discharge.  She will discharge to home on 05/26/17 with home health services of PT, OT, and nursing thru [facility].  She has a PMH of hypothyroidism, chronic systolic failure with reduced ejection fraction, stroke, subendocardial MI, dyslipidemia, and HTN.    She has been admitted to [facility] on 04/24/17 from the hospital with C. Diff Colitis. She has completed course of Vancomycin. CT scan showed inflammatory/infection colitis and stool PCR c. Diff positive. She had a recent left wrist fracture for which she has a splint on. She was treated for UTI with Rocephin while in the hospital, as well.  Patient was admitted to this facility for short-term rehabilitation after the patient's recent  hospitalization.  Patient has completed SNF rehabilitation and therapy has cleared the patient for discharge.    PAST MEDICAL HISTORY:  Past Medical History:  Diagnosis Date  . Advanced age   . Cardiac arrest [facility](HCC) 2007   Occurred during planned ovarian surgery that resolved spontaneously after her stomach was deflated.   . CVA (cerebral infarction) Oct 2010   "light stroke". Plavix started.  . Grief    from husband's death  . Hyperlipemia   . Hypertension   . Hypothyroidism   . Ischemic cardiomyopathy June 2009   managed medically; EF is 20 to 25%  . Left bundle branch block   . Left heart failure (HCC)    EF is 20 to 25%; She is managed conservatively  . Stroke (HCC)   . Subendocardial myocardial infarction [facility](HCC) June 2009     CURRENT MEDICATIONS: Reviewed    Medication List        Accurate as of 05/25/17 11:36 AM. Always use your most recent med list.          acetaminophen 500 MG tablet Commonly known as:  TYLENOL   atorvastatin 10 MG tablet Commonly known as:  LIPITOR   CENTRUM SILVER PO   cholecalciferol 1000 units tablet Commonly known as:  VITAMIN D   digoxin 0.125 MG tablet Commonly known as:  DIGITEK TAKE 1 TABLET (0.125 MG TOTAL) BY MOUTH DAILY.   furosemide 20 MG tablet Commonly known as:  LASIX  Take 1 tablet (20 mg total) by mouth daily.   * nitroGLYCERIN 0.2 mg/hr patch Commonly known as:  NITRODUR - Dosed in mg/24 hr PLACE ONE PATCH ONTO SKIN ONCE DAILY AT BEDTIME   * nitroGLYCERIN 0.4 MG SL tablet Commonly known as:  NITROSTAT Place 1 tablet (0.4 mg total) under the tongue every 5 (five) minutes as needed for chest pain.   potassium chloride 10 MEQ tablet Commonly known as:  K-DUR TAKE 1 TABLET (10 MEQ TOTAL) BY MOUTH DAILY.   SYNTHROID 50 MCG tablet Generic drug:  levothyroxine      * This list has 2 medication(s) that are the same as other medications prescribed for you. Read the directions carefully, and ask your doctor or  other care provider to review them with you.           Allergies  Allergen Reactions  . Procaine Hcl Hypertension    PASSED OUT     REVIEW OF SYSTEMS:  GENERAL: no change in appetite, no fatigue, no weight changes, no fever, chills or weakness MOUTH and THROAT: Denies oral discomfort, gingival pain or bleeding, pain from teeth or hoarseness   RESPIRATORY: no cough, SOB, DOE, wheezing, hemoptysis CARDIAC: no chest pain, edema or palpitations GI: no abdominal pain, diarrhea, constipation, heart burn, nausea or vomiting GU: Denies dysuria, frequency, hematuria, incontinence, or discharge PSYCHIATRIC: Denies feeling of depression or anxiety. No report of hallucinations, insomnia, paranoia, or agitation    PHYSICAL EXAMINATION  GENERAL APPEARANCE: Well nourished. In no acute distress. Normal body habitus SKIN:  Skin is warm and dry.  MOUTH and THROAT: Lips are without lesions. Oral mucosa is moist and without lesions.  RESPIRATORY: breathing is even & unlabored, BS CTAB CARDIAC: RRR, no murmur,no extra heart sounds, no edema GI: abdomen soft, normal BS, no masses, no tenderness EXTREMITIES:  Able to move X 4 extremities, splint covered with ACE wrap on left wrist. PSYCHIATRIC: Alert and oriented X 3. Affect and behavior are appropriate   LABS/RADIOLOGY: Labs reviewed: Basic Metabolic Panel: Recent Labs    04/21/17 0413 04/22/17 0411 04/23/17 0432  NA 138 140 140  K 3.9 3.9 3.8  CL 107 111 112*  CO2 20* 20* 22  GLUCOSE 102* 107* 90  BUN 33* 28* 25*  CREATININE 0.98 0.98 0.99  CALCIUM 8.1* 8.2* 8.1*   Liver Function Tests: Recent Labs    04/17/17 1558 04/20/17 0928 04/21/17 0413  AST 21 32 43*  ALT 13* 25 30  ALKPHOS 59 97 80  BILITOT 2.1* 1.8* 1.1  PROT 6.0* 6.1* 5.0*  ALBUMIN 3.4* 3.4* 2.6*   Recent Labs    04/17/17 1558  LIPASE 29   CBC: Recent Labs    04/17/17 1558 04/20/17 0928 04/21/17 0413 04/22/17 0411 04/23/17 0432  WBC 12.9* 14.9*  12.1* 13.8* 10.6*  NEUTROABS 9.7* 11.1*  --   --   --   HGB 11.9* 13.4 11.5* 11.2* 11.0*  HCT 36.3 40.3 35.2* 34.8* 33.6*  MCV 92.6 90.8 91.7 92.3 91.8  PLT 156 183 156 154 157    ASSESSMENT/PLAN:  1. Physical deconditioning - for home health PT and OT, for therapeutic strengthening exercises, fall precautions   2. Colitis -  No diarrhea nor complaints of abdominal pain, completed vancomycin treatment    3. Chronic systolic heart failure (HCC) - no SOB, continue Lasix 20 mg 1 tab daily   4. Hypothyroidism, unspecified type - continue levothyroxine 50 g 1 tab daily Lab Results  Component Value  Date   TSH 2.720 10/03/2016     5. Urinary tract infection without hematuria, site unspecified - completed Rocephin and treatment. Complaints of dysuria nor hematuria ,   6. Hyperlipidemia, unspecified hyperlipidemia type - Continue atorvastatin 10 mg 1 tab daily Lab Results  Component Value Date   CHOL 171 09/16/2015   HDL 56 09/16/2015   LDLCALC 83 09/16/2015   TRIG 160 (H) 09/16/2015   CHOLHDL 3.1 09/16/2015    7. Open fracture of left wrist, sequela - Continue spleen and left wrist and follow-up with Dr. Izora Ribasoley, hand surgeon  8. Hypokalemia -  continue KCl ER 10 meq 1 tab daily Lab Results  Component Value Date   K 3.8 04/23/2017   9. Ischemic cardiomyopathy - no chest pain, continue digoxin 125 g 1 tab daily, nitroglycerin 0.2 mg/HR1 patch transdermally at bedtime and nitroglycerin 0.4 mg 1 tab sublingual when necessary     I have filled out patient's discharge paperwork and written prescriptions.  Patient will receive home health PT, OT and Nursing.  DME provided:  3-in-1, platform walker  Total discharge time: Greater than 30 minutes Greater than 50% was spent in counseling and coordination of care.   Discharge time involved coordination of the discharge process with social worker, nursing staff and therapy department. Medical justification for home health  services/DME verified.    Kenard GowerMonina Medina-Vargas - NP [facility] [phone]

## 2017-06-02 ENCOUNTER — Encounter (HOSPITAL_COMMUNITY): Payer: Self-pay | Admitting: Emergency Medicine

## 2017-06-02 ENCOUNTER — Emergency Department (HOSPITAL_COMMUNITY)
Admission: EM | Admit: 2017-06-02 | Discharge: 2017-06-02 | Disposition: A | Discharge status: Home / Self Care | Payer: Medicare Other | Attending: Emergency Medicine | Admitting: Emergency Medicine

## 2017-06-02 DIAGNOSIS — E039 Hypothyroidism, unspecified: Secondary | ICD-10-CM | POA: Diagnosis not present

## 2017-06-02 DIAGNOSIS — Z87891 Personal history of nicotine dependence: Secondary | ICD-10-CM | POA: Insufficient documentation

## 2017-06-02 DIAGNOSIS — W19XXXA Unspecified fall, initial encounter: Secondary | ICD-10-CM

## 2017-06-02 DIAGNOSIS — I252 Old myocardial infarction: Secondary | ICD-10-CM | POA: Insufficient documentation

## 2017-06-02 DIAGNOSIS — Z79899 Other long term (current) drug therapy: Secondary | ICD-10-CM | POA: Diagnosis not present

## 2017-06-02 DIAGNOSIS — W010XXA Fall on same level from slipping, tripping and stumbling without subsequent striking against object, initial encounter: Secondary | ICD-10-CM | POA: Diagnosis not present

## 2017-06-02 DIAGNOSIS — I10 Essential (primary) hypertension: Secondary | ICD-10-CM | POA: Diagnosis not present

## 2017-06-02 DIAGNOSIS — Z8673 Personal history of transient ischemic attack (TIA), and cerebral infarction without residual deficits: Secondary | ICD-10-CM | POA: Diagnosis not present

## 2017-06-02 DIAGNOSIS — R197 Diarrhea, unspecified: Secondary | ICD-10-CM | POA: Diagnosis not present

## 2017-06-02 LAB — CBC WITH DIFFERENTIAL/PLATELET
Basophils Absolute: 0 10*3/uL (ref 0.0–0.1)
Basophils Relative: 0 %
Eosinophils Absolute: 0 10*3/uL (ref 0.0–0.7)
Eosinophils Relative: 0 %
HCT: 38.1 % (ref 36.0–46.0)
Hemoglobin: 12.2 g/dL (ref 12.0–15.0)
Lymphocytes Relative: 20 %
Lymphs Abs: 2.4 10*3/uL (ref 0.7–4.0)
MCH: 30.3 pg (ref 26.0–34.0)
MCHC: 32 g/dL (ref 30.0–36.0)
MCV: 94.5 fL (ref 78.0–100.0)
Monocytes Absolute: 1.1 10*3/uL — ABNORMAL HIGH (ref 0.1–1.0)
Monocytes Relative: 9 %
Neutro Abs: 8.6 10*3/uL — ABNORMAL HIGH (ref 1.7–7.7)
Neutrophils Relative %: 70 %
Platelets: 116 10*3/uL — ABNORMAL LOW (ref 150–400)
RBC: 4.03 MIL/uL (ref 3.87–5.11)
RDW: 15.2 % (ref 11.5–15.5)
WBC: 12.1 10*3/uL — ABNORMAL HIGH (ref 4.0–10.5)

## 2017-06-02 LAB — URINALYSIS, ROUTINE W REFLEX MICROSCOPIC
Bilirubin Urine: NEGATIVE
Glucose, UA: NEGATIVE mg/dL
Hgb urine dipstick: NEGATIVE
Ketones, ur: NEGATIVE mg/dL
Leukocytes, UA: NEGATIVE
Nitrite: NEGATIVE
Protein, ur: NEGATIVE mg/dL
Specific Gravity, Urine: 1.02 (ref 1.005–1.030)
pH: 5 (ref 5.0–8.0)

## 2017-06-02 LAB — BASIC METABOLIC PANEL
Anion gap: 10 (ref 5–15)
BUN: 22 mg/dL — ABNORMAL HIGH (ref 6–20)
CO2: 24 mmol/L (ref 22–32)
Calcium: 8.4 mg/dL — ABNORMAL LOW (ref 8.9–10.3)
Chloride: 105 mmol/L (ref 101–111)
Creatinine, Ser: 1.06 mg/dL — ABNORMAL HIGH (ref 0.44–1.00)
GFR calc Af Amer: 49 mL/min — ABNORMAL LOW (ref >60)
GFR calc non Af Amer: 42 mL/min — ABNORMAL LOW (ref >60)
Glucose, Bld: 98 mg/dL (ref 65–99)
Potassium: 3.2 mmol/L — ABNORMAL LOW (ref 3.5–5.1)
Sodium: 139 mmol/L (ref 135–145)

## 2017-06-02 MED ORDER — SODIUM CHLORIDE 0.9 % IV BOLUS (SEPSIS)
500.0000 mL | Freq: Once | INTRAVENOUS | Status: AC
  Administered 2017-06-02: 500 mL via INTRAVENOUS

## 2017-06-02 MED ORDER — VITAMIN D3 25 MCG (1000 UNIT) PO TABS: 1000.0000 [IU] | ORAL_TABLET | Freq: Every day | ORAL | 0 refills | Status: DC

## 2017-06-02 MED ORDER — ATORVASTATIN CALCIUM 10 MG PO TABS: 10.0000 mg | ORAL_TABLET | Freq: Every day | ORAL | 0 refills | Status: DC

## 2017-06-02 MED ORDER — CENTRUM SILVER PO TABS: 1.0000 | ORAL_TABLET | Freq: Every day | ORAL | 0 refills | Status: AC

## 2017-06-02 MED ORDER — DIGOXIN 125 MCG PO TABS: ORAL_TABLET | ORAL | 0 refills | Status: DC

## 2017-06-02 MED ORDER — SYNTHROID 50 MCG PO TABS: 50.0000 ug | ORAL_TABLET | Freq: Every day | ORAL | 0 refills | Status: DC

## 2017-06-02 MED ORDER — ACETAMINOPHEN 500 MG PO TABS: 500.0000 mg | ORAL_TABLET | Freq: Four times a day (QID) | ORAL | 0 refills | Status: AC | PRN

## 2017-06-02 MED ORDER — FUROSEMIDE 20 MG PO TABS: 20.0000 mg | ORAL_TABLET | Freq: Every day | ORAL | 0 refills | Status: DC

## 2017-06-02 MED ORDER — NITROGLYCERIN 0.2 MG/HR TD PT24: MEDICATED_PATCH | TRANSDERMAL | 0 refills | Status: DC

## 2017-06-02 MED ORDER — POTASSIUM CHLORIDE ER 10 MEQ PO TBCR: EXTENDED_RELEASE_TABLET | ORAL | 0 refills | Status: DC

## 2017-06-02 NOTE — ED Notes (Signed)
Patient transported to [facility] via [facility]

## 2017-06-02 NOTE — NC FL2 (Signed)
[place] MEDICAID FL2 LEVEL OF CARE SCREENING TOOL     IDENTIFICATION  Patient Name: Edrick KinsWilma L Fudala Birthdate: Nov 03, 1916 Sex: female Admission Date (Current Location): 06/02/2017  [facility] [place]   Facility and Address:  [facility], [address], [place], [place] [address]      Provider Number: [id number]  Attending Physician Name and Address:  Raeford RazorKohut, Stephen, MD  Relative Name and Phone Number:       Current Level of Care: Hospital Recommended Level of Care: Skilled Nursing Facility Prior Approval Number:    Date Approved/Denied:   PASRR Number:   [id number]   Discharge Plan: SNF    Current Diagnoses: Patient Active Problem List   Diagnosis Date Noted  . Diarrhea 04/20/2017  . Colitis 04/20/2017  . HFrEF (heart failure with reduced ejection fraction) (HCC) 04/20/2017  . Hypokalemia 04/20/2017  . Elevated bilirubin 04/20/2017  . CVA (cerebral infarction) 08/04/2011  . Ischemic cardiomyopathy   . Hypothyroidism   . Hyperlipemia     Orientation RESPIRATION BLADDER Height & Weight     Self, Time, Situation, Place  Normal Continent Weight: 121 lb (54.9 kg) Height:  5\' 6"  (167.6 cm)  BEHAVIORAL SYMPTOMS/MOOD NEUROLOGICAL BOWEL NUTRITION STATUS      Continent Diet(please see AVS.)  AMBULATORY STATUS COMMUNICATION OF NEEDS Skin   Extensive Assist   Normal                       Personal Care Assistance Level of Assistance  Bathing, Feeding, Dressing Bathing Assistance: Maximum assistance Feeding assistance: Limited assistance Dressing Assistance: Maximum assistance     Functional Limitations Info  Sight, Hearing, Speech Sight Info: Adequate Hearing Info: Adequate Speech Info: Adequate    SPECIAL CARE FACTORS FREQUENCY  PT (By licensed PT), OT (By licensed OT)     PT Frequency: 5 times a week OT Frequency: 5 times a week             Contractures Contractures Info: Not present     Additional Factors Info  Code Status, Allergies Code Status Info: Prior Allergies Info: Procaine Hcl           Current Medications (06/02/2017):  This is the current hospital active medication list No current facility-administered medications for this encounter.    Current Outpatient Medications  Medication Sig Dispense Refill  . acetaminophen (TYLENOL) 500 MG tablet Take 500 mg by mouth every 6 (six) hours as needed for mild pain, moderate pain, fever or headache.    Marland Kitchen. atorvastatin (LIPITOR) 10 MG tablet Take 1 tablet (10 mg total) daily by mouth. 30 tablet 0  . cholecalciferol (VITAMIN D) 1000 units tablet Take 1,000 Units by mouth daily.    . digoxin (DIGITEK) 0.125 MG tablet TAKE 1 TABLET (0.125 MG TOTAL) BY MOUTH DAILY. 30 tablet 0  . furosemide (LASIX) 20 MG tablet Take 1 tablet (20 mg total) daily by mouth. 30 tablet 0  . Multiple Vitamins-Minerals (CENTRUM SILVER PO) Take 1 tablet by mouth daily.    . nitroGLYCERIN (NITRODUR - DOSED IN MG/24 HR) 0.2 mg/hr patch PLACE ONE PATCH ONTO SKIN ONCE DAILY AT BEDTIME 30 patch 0  . potassium chloride (K-DUR) 10 MEQ tablet TAKE 1 TABLET (10 MEQ TOTAL) BY MOUTH DAILY. 30 tablet 0  . SYNTHROID 50 MCG tablet Take 1 tablet (50 mcg total) daily by mouth. 30 tablet 0  . nitroGLYCERIN (NITROSTAT) 0.4 MG SL tablet Place 1 tablet (  0.4 mg total) every 5 (five) minutes as needed under the tongue for chest pain. 25 tablet 0     Discharge Medications: Please see discharge summary for a list of discharge medications.  Relevant Imaging Results:  Relevant Lab Results:   Additional Information SSN- [id number]  Robb MatarKierra S Yamileth Hayse, LCSWA

## 2017-06-02 NOTE — Care Management (Signed)
ED CM spoke with patient's daughter Marlyn Corporallicia Thompson [phone].  Daughter reports patient was recently [facility]. According to daughter patient has been experiencing weakness and diarrhea since discharge. Patient is active with [facility]  with RN,PT, OT services.  Daughter states, patient is unable to walk with walker due to weakness, her care goals are to return to SNF until she is able to return to her level of functioning. CM attempted to contact [facility] for return call.  PT eval ordered. CSW consulted as well. CM will continue follow for transitional care planning.

## 2017-06-02 NOTE — ED Provider Notes (Signed)
[facility] EMERGENCY [facility] Provider Note   CSN: [id number] Arrival date & time: 06/02/17  [id number]     History   Chief Complaint Chief Complaint  Patient presents with  . Fall  . Diarrhea    HPI [name] is a 83 y.o. female.  HPI    83 year old female presents status post fall.  Patient reports that she fell sometime in the early morning hours, unable to recall the fall.  She notes that she crawled to her back toward knowing her neighbor would find her.  Patient is uncertain when she was on the ground or events surrounding this.  She denies any significant pain.  She reports an episode of diarrhea yesterday.  Patient most recently discharged from the hospital on 04/23/2017 with C. difficile colitis, she was discharged with 14 days of p.o. Vancomycin.  Patient lives at home alone. She notes increasing weakness over the last several days.     Past Medical History:  Diagnosis Date  . Advanced age   . Cardiac arrest [facility](HCC) 2007   Occurred during planned ovarian surgery that resolved spontaneously after her stomach was deflated.   . CVA (cerebral infarction) Oct 2010   "light stroke". Plavix started.  . Grief    from husband's death  . Hyperlipemia   . Hypertension   . Hypothyroidism   . Ischemic cardiomyopathy June 2009   managed medically; EF is 20 to 25%  . Left bundle branch block   . Left heart failure (HCC)    EF is 20 to 25%; She is managed conservatively  . Stroke (HCC)   . Subendocardial myocardial infarction [facility](HCC) June 2009    Patient Active Problem List   Diagnosis Date Noted  . Diarrhea 04/20/2017  . Colitis 04/20/2017  . HFrEF (heart failure with reduced ejection fraction) (HCC) 04/20/2017  . Hypokalemia 04/20/2017  . Elevated bilirubin 04/20/2017  . CVA (cerebral infarction) 08/04/2011  . Ischemic cardiomyopathy   . Hypothyroidism   . Hyperlipemia     Past Surgical History:  Procedure Laterality Date  . CATARACT  EXTRACTION  09/18/2012   right eye  . DILATION AND CURETTAGE OF UTERUS    . OVARIAN CYST REMOVAL     2007-2008    OB History    No data available       Home Medications    Prior to Admission medications   Medication Sig Start Date End Date Taking? Authorizing Provider  acetaminophen (TYLENOL) 500 MG tablet Take 1 tablet (500 mg total) by mouth every 6 (six) hours as needed for mild pain, moderate pain, fever or headache. 06/02/17   Addley Ballinger, Tinnie GensJeffrey, PA-C  atorvastatin (LIPITOR) 10 MG tablet Take 1 tablet (10 mg total) by mouth daily. 06/02/17   Elton Heid, Tinnie GensJeffrey, PA-C  cholecalciferol (VITAMIN D) 1000 units tablet Take 1 tablet (1,000 Units total) by mouth daily. 06/02/17   Ebin Palazzi, Tinnie GensJeffrey, PA-C  digoxin (DIGITEK) 0.125 MG tablet TAKE 1 TABLET (0.125 MG TOTAL) BY MOUTH DAILY. 06/02/17   Noella Kipnis, Tinnie GensJeffrey, PA-C  furosemide (LASIX) 20 MG tablet Take 1 tablet (20 mg total) by mouth daily. 06/02/17   Savon Bordonaro, Tinnie GensJeffrey, PA-C  Multiple Vitamins-Minerals (CENTRUM SILVER) tablet Take 1 tablet by mouth daily. 06/02/17   Brynnly Bonet, Tinnie GensJeffrey, PA-C  nitroGLYCERIN (NITRODUR - DOSED IN MG/24 HR) 0.2 mg/hr patch PLACE ONE PATCH ONTO SKIN ONCE DAILY AT BEDTIME 06/02/17   Kashon Kraynak, Tinnie GensJeffrey, PA-C  nitroGLYCERIN (NITROSTAT) 0.4 MG SL tablet Place 1 tablet (0.4 mg total) every 5 (  five) minutes as needed under the tongue for chest pain. 05/25/17   Medina-Vargas, Monina C, NP  potassium chloride (K-DUR) 10 MEQ tablet TAKE 1 TABLET (10 MEQ TOTAL) BY MOUTH DAILY. 06/02/17   Nettie Cromwell, Tinnie GensJeffrey, PA-C  SYNTHROID 50 MCG tablet Take 1 tablet (50 mcg total) by mouth daily. 06/02/17   Eyvonne MechanicHedges, Rohil Lesch, PA-C    Family History Family History  Problem Relation Age of Onset  . Aneurysm Father        ruptured  . Heart attack Mother   . Heart defect Mother        enlarged heart    Social History Social History   Tobacco Use  . Smoking status: Former Smoker    Last attempt to quit: 07/11/1968    Years since quitting: 48.9  .  Smokeless tobacco: Never Used  Substance Use Topics  . Alcohol use: No  . Drug use: No     Allergies   Procaine hcl   Review of Systems Review of Systems  All other systems reviewed and are negative.    Physical Exam Updated Vital Signs BP (!) 147/66 (BP Location: Right Arm)   Pulse 74   Temp 97.9 F (36.6 C) (Oral)   Resp 16   Ht 5\' 6"  (1.676 m)   Wt 54.9 kg (121 lb)   SpO2 100%   BMI 19.53 kg/m   Physical Exam  Constitutional: She is oriented to person, place, and time. She appears well-developed and well-nourished.  HENT:  Head: Normocephalic and atraumatic.  Eyes: Conjunctivae are normal. Pupils are equal, round, and reactive to light. Right eye exhibits no discharge. Left eye exhibits no discharge. No scleral icterus.  Neck: Normal range of motion. No JVD present. No tracheal deviation present.  Pulmonary/Chest: Effort normal. No stridor.  Musculoskeletal:  No CTL spine TTP- back atraumatic NTTP  No pain with palpation or ROM of extremities, hips stable non tender with AP/Lateral compression   Left wrist with bandaging in place   Neurological: She is alert and oriented to person, place, and time. No cranial nerve deficit. Coordination normal.  Psychiatric: She has a normal mood and affect. Her behavior is normal. Judgment and thought content normal.  Nursing note and vitals reviewed.    ED Treatments / Results  Labs (all labs ordered are listed, but only abnormal results are displayed) Labs Reviewed  CBC WITH DIFFERENTIAL/PLATELET - Abnormal; Notable for the following components:      Result Value   WBC 12.1 (*)    Platelets 116 (*)    Neutro Abs 8.6 (*)    Monocytes Absolute 1.1 (*)    All other components within normal limits  BASIC METABOLIC PANEL - Abnormal; Notable for the following components:   Potassium 3.2 (*)    BUN 22 (*)    Creatinine, Ser 1.06 (*)    Calcium 8.4 (*)    GFR calc non Af Amer 42 (*)    GFR calc Af Amer 49 (*)    All  other components within normal limits  C DIFFICILE QUICK SCREEN W PCR REFLEX  URINALYSIS, ROUTINE W REFLEX MICROSCOPIC    EKG  EKG Interpretation None       Radiology No results found.  Procedures Procedures (including critical care time)  Medications Ordered in ED Medications  sodium chloride 0.9 % bolus 500 mL (0 mLs Intravenous Stopped 06/02/17 1209)  sodium chloride 0.9 % bolus 500 mL (0 mLs Intravenous Stopped 06/02/17 1209)  sodium chloride 0.9 %  bolus 500 mL (500 mLs Intravenous New Bag/Given 06/02/17 1300)     Initial Impression / Assessment and Plan / ED Course  I have reviewed the triage vital signs and the nursing notes.  Pertinent labs & imaging results that were available during my care of the patient were reviewed by me and considered in my medical decision making (see chart for details).     Final Clinical Impressions(s) / ED Diagnoses   Final diagnoses:  Fall, initial encounter   Labs: Urinalysis, CBC, BMP  Imaging:  Consults: Case Management   Therapeutics: NS  Discharge Meds:   Assessment/Plan: 89 YOF SP fall. Uncertain etiolgy. PT alert and oriented with no signs of trauma.  Patient does appear to be deconditioned and fatigued.  Patient was ambulated in the hall requiring significant assistance.  Her laboratory analysis reassuring with the exception of slightly decreased potassium (patient on potassium supplements).  Do not feel patient would be safe at home at this time.  Case management consulted who was able to provide placement for this patient.  She will be going to [place] facility.  Patient had diarrhea prior to arrival, none today, unable to obtain stool sample.     ED Discharge Orders        Ordered    Home Health     06/02/17 1324    Face-to-face encounter (required for Medicare/Medicaid patients)    Comments:  I Kelle Darting Jannis Atkins certify that this patient is under my care and that I, or a nurse practitioner or physician's  assistant working with me, had a face-to-face encounter that meets the physician face-to-face encounter requirements with this patient on 06/02/2017. The encounter with the patient was in whole, or in part for the following medical condition(s) which is the primary reason for home health care (List medical condition): Patient is unable to ambulate safely at home due to advanced age and deconditioning   06/02/17 1324    acetaminophen (TYLENOL) 500 MG tablet  Every 6 hours PRN     06/02/17 1357    atorvastatin (LIPITOR) 10 MG tablet  Daily     06/02/17 1357    cholecalciferol (VITAMIN D) 1000 units tablet  Daily     06/02/17 1357    digoxin (DIGITEK) 0.125 MG tablet     06/02/17 1357    furosemide (LASIX) 20 MG tablet  Daily     06/02/17 1357    Multiple Vitamins-Minerals (CENTRUM SILVER) tablet  Daily     06/02/17 1357    potassium chloride (K-DUR) 10 MEQ tablet     06/02/17 1357    SYNTHROID 50 MCG tablet  Daily     06/02/17 1357    nitroGLYCERIN (NITRODUR - DOSED IN MG/24 HR) 0.2 mg/hr patch    Comments:  CYCLE FILL MEDICATION. Authorization is required for next refill.   06/02/17 1357       Eyvonne Mechanic, PA-C 06/02/17 1401    Eyvonne Mechanic, PA-C 06/02/17 1411    Raeford Razor, MD 06/03/17 [phone]

## 2017-06-02 NOTE — Discharge Instructions (Signed)
Please read attached information. If you experience any new or worsening signs or symptoms please return to the emergency room for evaluation. Please follow-up with your primary care provider or specialist as discussed. Please use medication prescribed only as directed and discontinue taking if you have any concerning signs or symptoms.   °

## 2017-06-02 NOTE — Progress Notes (Signed)
CSW spoke [place] [facility] and was informed that they are able to take pt as of today. CSW has faxed over AVS and medication list at this time. CSW was informed pt would be going to room 311 and RN is to call report to [phone]). RN also informed CSW that she called [facility]. At this time there are no further CSW needs, CSW signing off.        Claude MangesKierra S. Oyuki Hogan, MSW, LCSW-A Emergency Department Clinical Social Worker [phone]

## 2017-06-02 NOTE — ED Triage Notes (Signed)
Per [facility] patient coming from home after a fall that occurred sometime last night. Patient's neighbors found her on floor this morning. Patient recently diagnosed with C DIFF and has had diarrhea on and off for a month. Patient complains of left flank pain and left knee pain. BBB on cardiac monitor. Patient unsure how fall occurred but remembers crawling to door. Patient alert and oriented.

## 2017-06-02 NOTE — ED Notes (Signed)
Pt ambulated thru hallway with 2 person assist. Pt had unsteady gait and stated she felt very weak during walk. Pt returned to bed on monitors and resting comfortably. Pt appeared very tired from ambulation with staff

## 2017-06-02 NOTE — ED Notes (Signed)
Called [facility] for transportation  

## 2017-06-02 NOTE — Progress Notes (Signed)
PT Cancellation Note  Patient Details Name: Tanya KinsWilma L Duarte MRN: [id number] DOB: 18-Sep-1916   Cancelled Treatment:    Reason Eval/Treat Not Completed: Other (comment) Per MD, pt does not need PT consult at this time. Will sign off. Please re-consult if needs change.   Gladys DammeBrittany Brexlee Heberlein, PT, DPT  Acute Rehabilitation Services  Pager: [phone]    Lehman PromBrittany S Raynelle Fujikawa 06/02/2017, 3:07 PM

## 2017-06-02 NOTE — ED Notes (Signed)
Daughter contacted and made aware of her mother's location

## 2017-06-02 NOTE — Care Management (Signed)
ED CM received return call from AstronomerChrista Administrator at [facility] they can accept patient back for continued therapy today. Updated CSW, will send patient information via HUB.  Daughter Helmut Musterlicia informed that there will be a $167 daily charge in which they can make payment arrangements. She is agreeable and will call to workout the payment details and sign paperwork. Updated J Hedges PA-C. [facility]. No further ED CM needs identified.

## 2017-06-05 ENCOUNTER — Encounter: Payer: Self-pay | Admitting: Internal Medicine

## 2017-06-05 ENCOUNTER — Non-Acute Institutional Stay (SKILLED_NURSING_FACILITY): Payer: Medicare Other | Admitting: Internal Medicine

## 2017-06-05 DIAGNOSIS — R9431 Abnormal electrocardiogram [ECG] [EKG]: Secondary | ICD-10-CM

## 2017-06-05 DIAGNOSIS — R413 Other amnesia: Secondary | ICD-10-CM | POA: Diagnosis not present

## 2017-06-05 DIAGNOSIS — R296 Repeated falls: Secondary | ICD-10-CM | POA: Insufficient documentation

## 2017-06-05 NOTE — Patient Instructions (Signed)
See assessment and plan under each diagnosis in the problem list and acutely for this visit 

## 2017-06-05 NOTE — Progress Notes (Signed)
NURSING HOME LOCATION:  [facility] ROOM NUMBER:  311-A  CODE STATUS:  Full Code  PCP:  Burton Apleyoberts, Ronald, MD  [address] [place] [id number]  This is a nursing facility follow up for Nursing Facility readmission within 30 days  Interim medical record and care since last [facility] visit was updated with review of diagnostic studies and change in clinical status since last visit were documented.  HPI: [facility], but fell at home and was seen in the ED 06/02/17. The patient was unable to provide history related to the fall beyond being weak & dizzy while up in her kitchen. She does live by herself. She crawled to the back of her home knowing that her neighbor would find her. She could not define how long she had been on the floor before being found. The patient had noted increasing weakness over the several days prior to the fall and  on day prior to the fall patient did have an episode of diarrhea.  She had been discharged from the hospital 04/23/17 with C. difficile colitis and received 14 days of oral vancomycin. The C difficle was in the context of antibiotic therapy for skin tear following a fall & Fosfomycin for possible UTI. ED discharge labs 11/23 revealed a BUN of 22, creatinine 1.06, GFR 32 and potassium of 3.2. Past medical history, surgical history, social history, family history are unchanged from that recorded in the SNF admission note 04/25/17.  She fractured her wrist in a mechanical fall, but surgery was deferred because of a prior history of cardiac arrest perioperatively at the time of ovarian surgery. At some point in the past 6 months she experienced a "drop attack" while in line @ church for communion. She declined medical assessment. Denied prior to the falls is any definite cardiac or neurologic prodrome.  Review of systems: The patient states that she has difficulty remembering. She gave the month and the year  but not day of month.  She did not think she was receiving her nitroglycerin patch here @ SNF, but MAR documents its administration.. She sees her cardiologist every 6 months. She denies any cardiac symptoms at this time.EKG reveals LBBB & prolonged PR interval.  Constitutional: No fever,significant weight change, fatigue  Eyes: No redness, discharge, pain, vision change ENT/mouth: No nasal congestion,  purulent discharge, earache,change in hearing ,sore throat  Cardiovascular: No chest pain, palpitations,paroxysmal nocturnal dyspnea, claudication, edema  Respiratory: No cough, sputum production,hemoptysis, DOE , significant snoring,apnea  Gastrointestinal: No heartburn,dysphagia,abdominal pain, nausea / vomiting,rectal bleeding, melena,change in bowels Genitourinary: No dysuria,hematuria, pyuria,  incontinence, nocturia Musculoskeletal: No joint stiffness, joint swelling, weakness,pain Dermatologic: No rash, pruritus, change in appearance of skin Neurologic: No headache, seizures, numbness , tingling Psychiatric: No significant anxiety , depression, insomnia, anorexia Endocrine: No change in hair/skin/ nails, excessive thirst, excessive hunger, excessive urination  Hematologic/lymphatic: No significant bruising, lymphadenopathy,abnormal bleeding Allergy/immunology: No itchy/ watery eyes, significant sneezing, urticaria, angioedema  Physical exam:  Pertinent or positive findings: she is hard of hearing. Pupils are small. Ectropion of the left eye and slight ptosis present. There is intermittent flow murmur grade 1/2. Heart rhythm is slightly irregular. Second heart sound is increased. Breath sounds are decreased. There is accentuation of the curvature of the thoracic spine. Posterior tibial pulses are decreased. Slightly weak to opposition in the right upper extremity. Slight clubbing of the fingernails. The left wrist is broad. The left forearm is wrapped   General  appearance:Adequately  nourished; no acute distress , increased work of breathing is present.   Lymphatic: No lymphadenopathy about the head, neck, axilla . Eyes: No conjunctival inflammation or lid edema is present. There is no scleral icterus. Ears:  External ear exam shows no significant lesions or deformities.   Nose:  External nasal examination shows no deformity or inflammation. Nasal mucosa are pink and moist without lesions ,exudates Oral exam: lips and gums are healthy appearing.There is no oropharyngeal erythema or exudate . Neck:  No thyromegaly, masses, tenderness noted.    Heart:  No gallop, click, rub .  Lungs: without wheezes, rhonchi,rales , rubs. Abdomen:Bowel sounds are normal. Abdomen is soft and nontender with no organomegaly, hernias,masses. GU: deferred  Extremities:  No cyanosis, clubbing,edema  Skin: Warm & dry w/o tenting. No significant lesions or rash.  See summary under each active problem in the Problem List with associated updated therapeutic plan

## 2017-06-05 NOTE — Assessment & Plan Note (Addendum)
Cardiology follow-up to assess for possible heart block as cause of falls  monitor for postural hypotension

## 2017-06-06 DIAGNOSIS — R9431 Abnormal electrocardiogram [ECG] [EKG]: Secondary | ICD-10-CM | POA: Insufficient documentation

## 2017-06-06 DIAGNOSIS — R413 Other amnesia: Secondary | ICD-10-CM | POA: Insufficient documentation

## 2017-06-06 NOTE — Assessment & Plan Note (Signed)
MS testing with [facility]

## 2017-06-06 NOTE — Assessment & Plan Note (Signed)
R/O bradyarrhythmia as factor in falls; ? Event Monitor

## 2017-06-12 NOTE — Progress Notes (Signed)
DONE

## 2017-06-14 ENCOUNTER — Non-Acute Institutional Stay (SKILLED_NURSING_FACILITY): Payer: Medicare Other | Admitting: Adult Health

## 2017-06-14 ENCOUNTER — Encounter: Payer: Self-pay | Admitting: Adult Health

## 2017-06-14 DIAGNOSIS — I5022 Chronic systolic (congestive) heart failure: Secondary | ICD-10-CM | POA: Diagnosis not present

## 2017-06-14 DIAGNOSIS — R531 Weakness: Secondary | ICD-10-CM | POA: Diagnosis not present

## 2017-06-14 DIAGNOSIS — E039 Hypothyroidism, unspecified: Secondary | ICD-10-CM

## 2017-06-14 DIAGNOSIS — E876 Hypokalemia: Secondary | ICD-10-CM

## 2017-06-14 DIAGNOSIS — I255 Ischemic cardiomyopathy: Secondary | ICD-10-CM | POA: Diagnosis not present

## 2017-06-14 DIAGNOSIS — E785 Hyperlipidemia, unspecified: Secondary | ICD-10-CM

## 2017-06-14 MED ORDER — SYNTHROID 50 MCG PO TABS: 50.0000 ug | ORAL_TABLET | Freq: Every day | ORAL | 0 refills | Status: DC

## 2017-06-14 MED ORDER — NITROGLYCERIN 0.2 MG/HR TD PT24: MEDICATED_PATCH | TRANSDERMAL | 0 refills | Status: DC

## 2017-06-14 MED ORDER — VITAMIN D3 25 MCG (1000 UNIT) PO TABS: 1000.0000 [IU] | ORAL_TABLET | Freq: Every day | ORAL | 0 refills | Status: AC

## 2017-06-14 MED ORDER — POTASSIUM CHLORIDE ER 10 MEQ PO TBCR: EXTENDED_RELEASE_TABLET | ORAL | 0 refills | Status: AC

## 2017-06-14 MED ORDER — ATORVASTATIN CALCIUM 10 MG PO TABS: 10.0000 mg | ORAL_TABLET | Freq: Every day | ORAL | 0 refills | Status: AC

## 2017-06-14 MED ORDER — DIGOXIN 125 MCG PO TABS: ORAL_TABLET | ORAL | 0 refills | Status: AC

## 2017-06-14 MED ORDER — NITROGLYCERIN 0.4 MG SL SUBL: 0.4000 mg | SUBLINGUAL_TABLET | SUBLINGUAL | 0 refills | Status: AC | PRN

## 2017-06-14 MED ORDER — FUROSEMIDE 20 MG PO TABS: 20.0000 mg | ORAL_TABLET | Freq: Every day | ORAL | 0 refills | Status: DC

## 2017-06-14 NOTE — Progress Notes (Signed)
Location:  [facility] Room Number: 216-A Place of Service:  SNF (31) Provider:  Kenard GowerMedina-Vargas, Celia Friedland, NP  Patient Care Team: Burton Apleyoberts, Ronald, MD as PCP - General (Internal Medicine)  Extended Emergency Contact Information Primary Emergency Contact: Marlyn Corporalhompson,Alicia Address: [address]          [place] Home Phone: [phone] Mobile Phone: [phone] Relation: Daughter  Code Status:  Full Code Goals of care: Advanced Directive information Advanced Directives 06/02/2017  Does Patient Have a Medical Advance Directive? No  Type of Advance Directive -  Does patient want to make changes to medical advance directive? -  Would patient like information on creating a medical advance directive? -     Chief Complaint  Patient presents with  . Discharge Note    Patient is discharging to an assisted living facility on 06/13/17    HPI:  Pt is a 83 y.o. female seen today for discharge.  She will discharge to an assisted living facility on 06/15/2017 with home health OT, PT, and Nursing services.    [facility] on  06/02/17 from the hospital. [facility] after a recent hospitalizatio with C. Difficile colitis and was treated with 14 days of oral Vancomycin. She fell at home and was seen in the ED on 06/02/17. ED discharge labs revealed BUN 22, creatinine 1.06, GFR 32 and K 3.2 .She has a PMH of hypothyroidism, chronic systolic failure with reduced ejection fraction, stroke, subendocardial MI, dyslipidemia, and HTN.    Patient was admitted to this facility for short-term rehabilitation after the patient's recent hospitalization.  Patient has completed SNF rehabilitation and therapy has cleared the patient for discharge.    Past Medical History:  Diagnosis Date  . Advanced age   . Cardiac arrest [facility](HCC) 2007   Occurred during planned ovarian surgery that  resolved spontaneously after her stomach was deflated.   . CVA (cerebral infarction) Oct 2010   "light stroke". Plavix started.  . Grief    from husband's death  . Hyperlipemia   . Hypertension   . Hypothyroidism   . Ischemic cardiomyopathy June 2009   managed medically; EF is 20 to 25%  . Left bundle branch block   . Left heart failure (HCC)    EF is 20 to 25%; She is managed conservatively  . Stroke (HCC)   . Subendocardial myocardial infarction [facility](HCC) June 2009   Past Surgical History:  Procedure Laterality Date  . CATARACT EXTRACTION  09/18/2012   right eye  . DILATION AND CURETTAGE OF UTERUS    . OVARIAN CYST REMOVAL     2007-2008    Allergies  Allergen Reactions  . Procaine Hcl Hypertension    PASSED OUT    Outpatient Encounter Medications as of 06/14/2017  Medication Sig  . acetaminophen (TYLENOL) 500 MG tablet Take 1 tablet (500 mg total) by mouth every 6 (six) hours as needed for mild pain, moderate pain, fever or headache.  Marland Kitchen. atorvastatin (LIPITOR) 10 MG tablet Take 1 tablet (10 mg total) by mouth daily.  . cholecalciferol (VITAMIN D) 1000 units tablet Take 1 tablet (1,000 Units total) by mouth daily.  . digoxin (DIGITEK) 0.125 MG tablet TAKE 1 TABLET (0.125 MG TOTAL) BY MOUTH DAILY.  . furosemide (LASIX) 20 MG tablet Take 1 tablet (20 mg total) by mouth daily.  . Multiple Vitamins-Minerals (CENTRUM SILVER) tablet Take 1 tablet by mouth daily.  .Marland Kitchen  nitroGLYCERIN (NITRODUR - DOSED IN MG/24 HR) 0.2 mg/hr patch PLACE ONE PATCH ONTO SKIN ONCE DAILY AT BEDTIME  . nitroGLYCERIN (NITROSTAT) 0.4 MG SL tablet Place 1 tablet (0.4 mg total) every 5 (five) minutes as needed under the tongue for chest pain.  . potassium chloride (K-DUR) 10 MEQ tablet TAKE 1 TABLET (10 MEQ TOTAL) BY MOUTH DAILY.  . SYNTHROID 50 MCG tablet Take 1 tablet (50 mcg total) by mouth daily.   No facility-administered encounter medications on file as of 06/14/2017.     Review of Systems  GENERAL: No  change in appetite, no fatigue, no weight changes, no fever, chills or weakness MOUTH and THROAT: Denies oral discomfort, gingival pain or bleeding RESPIRATORY: no cough, SOB, DOE, wheezing, hemoptysis CARDIAC: No chest pain, or palpitations GI: No abdominal pain, diarrhea, constipation, heart burn, nausea or vomiting GU: Denies dysuria, frequency, hematuria, or discharge PSYCHIATRIC: Denies feelings of depression or anxiety. No report of hallucinations, insomnia, paranoia, or agitation   Immunization History  Administered Date(s) Administered  . Pneumococcal-Unspecified 03/12/2015   Pertinent  Health Maintenance Due  Topic Date Due  . DEXA SCAN  08/11/1983  . PNA vac Low Risk Adult (2 of 2 - PCV13) 03/11/2016  . INFLUENZA VACCINE  02/08/2017      Vitals:   06/14/17 1357  BP: 130/75  Pulse: 76  Resp: 20  Temp: (!) 97.5 F (36.4 C)  TempSrc: Oral  SpO2: 96%  Weight: 121 lb (54.9 kg)  Height: 5\' 6"  (1.676 m)   Body mass index is 19.53 kg/m.   Physical Exam  GENERAL APPEARANCE:  In no acute distress.  SKIN:  Skin is warm and dry.  MOUTH and THROAT: Lips are without lesions. Oral mucosa is moist and without lesions.  RESPIRATORY: Breathing is even & unlabored, BS CTAB CARDIAC: RRR, no murmur,no extra heart sounds, BLE 1-2+ edema GI: Abdomen soft, normal BS, no masses, no tenderness EXTREMTIES:  Able to move X 4 extremities PSYCHIATRIC: Alert to self and place, disoriented to time. Affect and behavior are appropriate   Labs reviewed: Recent Labs    04/22/17 0411 04/23/17 0432 06/02/17 0933  NA 140 140 139  K 3.9 3.8 3.2*  CL 111 112* 105  CO2 20* 22 24  GLUCOSE 107* 90 98  BUN 28* 25* 22*  CREATININE 0.98 0.99 1.06*  CALCIUM 8.2* 8.1* 8.4*   Recent Labs    04/17/17 1558 04/20/17 0928 04/21/17 0413  AST 21 32 43*  ALT 13* 25 30  ALKPHOS 59 97 80  BILITOT 2.1* 1.8* 1.1  PROT 6.0* 6.1* 5.0*  ALBUMIN 3.4* 3.4* 2.6*   Recent Labs    04/17/17 1558  04/20/17 0928  04/22/17 0411 04/23/17 0432 06/02/17 0933  WBC 12.9* 14.9*   < > 13.8* 10.6* 12.1*  NEUTROABS 9.7* 11.1*  --   --   --  8.6*  HGB 11.9* 13.4   < > 11.2* 11.0* 12.2  HCT 36.3 40.3   < > 34.8* 33.6* 38.1  MCV 92.6 90.8   < > 92.3 91.8 94.5  PLT 156 183   < > 154 157 116*   < > = values in this interval not displayed.   Lab Results  Component Value Date   TSH 2.720 10/03/2016   Lab Results  Component Value Date   HGBA1C  12/20/2007    5.7 (NOTE)   The ADA recommends the following therapeutic goals for glycemic   control related to Hgb A1C measurement:  Goal of Therapy:   < 7.0% Hgb A1C   Action Suggested:  > 8.0% Hgb A1C   Ref:  Diabetes Care, 22, Suppl. 1, 1999   Lab Results  Component Value Date   CHOL 171 09/16/2015   HDL 56 09/16/2015   LDLCALC 83 09/16/2015   TRIG 160 (H) 09/16/2015   CHOLHDL 3.1 09/16/2015     Assessment/Plan  1. Generalized weakness - for home health PT and OT,for therapeutic strengthening exercises, fall precautions   2. Chronic systolic heart failure (HCC) - no SOB, continue Lasix 20 mg 1 tab daily   3. Hypothyroidism, unspecified type - continue levothyroxine 50 g 1 tab daily Lab Results  Component Value Date   TSH 2.720 10/03/2016     4. Hyperlipidemia, unspecified hyperlipidemia type - continue atorvastatin 10 mg 1 tab daily   5. Ischemic cardiomyopathy - continue nitroglycerin 0.2 mg/HR  Patch transdermally daily at bedtime and NTG when necessary   6. Hypokalemia - continue KCl ER 10 meq daily Lab Results  Component Value Date   K 3.2 (L) 06/02/2017       I have filled out patient's discharge paperwork and written prescriptions.  Patient will receive home health PT, OT, and Nursing.  DME provided:  None  Total discharge time: Greater than 30 minutes Greater than 50% was spent in counseling and coordination of care.    Discharge time involved coordination of the discharge process with social worker,  nursing staff and therapy department. Medical justification for home health services verified.    Kenard GowerMonina Medina-Vargas, NP [facility] [phone]

## 2017-09-18 ENCOUNTER — Other Ambulatory Visit: Payer: Self-pay | Admitting: Adult Health

## 2017-11-22 ENCOUNTER — Ambulatory Visit: Payer: Medicare Other | Admitting: Nurse Practitioner

## 2018-09-16 ENCOUNTER — Emergency Department (HOSPITAL_COMMUNITY)
Admission: EM | Admit: 2018-09-16 | Discharge: 2018-09-17 | Disposition: A | Discharge status: Home / Self Care | Payer: Medicare Other | Attending: Emergency Medicine | Admitting: Emergency Medicine

## 2018-09-16 ENCOUNTER — Other Ambulatory Visit: Payer: Self-pay

## 2018-09-16 ENCOUNTER — Encounter (HOSPITAL_COMMUNITY): Payer: Self-pay | Admitting: Emergency Medicine

## 2018-09-16 DIAGNOSIS — Z79899 Other long term (current) drug therapy: Secondary | ICD-10-CM | POA: Diagnosis not present

## 2018-09-16 DIAGNOSIS — Z8673 Personal history of transient ischemic attack (TIA), and cerebral infarction without residual deficits: Secondary | ICD-10-CM | POA: Diagnosis not present

## 2018-09-16 DIAGNOSIS — R197 Diarrhea, unspecified: Secondary | ICD-10-CM | POA: Diagnosis present

## 2018-09-16 DIAGNOSIS — R42 Dizziness and giddiness: Secondary | ICD-10-CM | POA: Insufficient documentation

## 2018-09-16 DIAGNOSIS — A09 Infectious gastroenteritis and colitis, unspecified: Secondary | ICD-10-CM | POA: Insufficient documentation

## 2018-09-16 DIAGNOSIS — I10 Essential (primary) hypertension: Secondary | ICD-10-CM | POA: Diagnosis not present

## 2018-09-16 DIAGNOSIS — E039 Hypothyroidism, unspecified: Secondary | ICD-10-CM | POA: Insufficient documentation

## 2018-09-16 DIAGNOSIS — I252 Old myocardial infarction: Secondary | ICD-10-CM | POA: Diagnosis not present

## 2018-09-16 DIAGNOSIS — Z87891 Personal history of nicotine dependence: Secondary | ICD-10-CM | POA: Insufficient documentation

## 2018-09-16 MED ORDER — SODIUM CHLORIDE 0.9 % IV BOLUS
250.0000 mL | Freq: Once | INTRAVENOUS | Status: AC
  Administered 2018-09-17: 250 mL via INTRAVENOUS

## 2018-09-16 MED ORDER — ONDANSETRON HCL 4 MG/2ML IJ SOLN
4.0000 mg | Freq: Once | INTRAMUSCULAR | Status: AC
  Administered 2018-09-17: 4 mg via INTRAVENOUS
  Filled 2018-09-16: qty 2

## 2018-09-16 NOTE — ED Provider Notes (Signed)
[facility]-EMERGENCY DEPT Provider Note: Tanya Dell, MD, FACEP  CSN: [id number] MRN: [id number] ARRIVAL: 09/16/18 at 2307 ROOM: WA15/WA15   CHIEF COMPLAINT  Diarrhea   HISTORY OF PRESENT ILLNESS  09/16/18 11:26 PM [name] is a 85 y.o. female from [name] where several fellow residents have had GI illnesses recently.  She is here with diarrhea that began around noon.  She states she has had multiple episodes.  This is been associated with dizziness but she is able to ambulate using her walker.  She denies associated abdominal pain, nausea or vomiting (she did report nausea to her nurse).    Past Medical History:  Diagnosis Date  . Advanced age   . Cardiac arrest [facility]) 2007   Occurred during planned ovarian surgery that resolved spontaneously after her stomach was deflated.   . CVA (cerebral infarction) Oct 2010   "light stroke". Plavix started.  . Grief    from husband's death  . Hyperlipemia   . Hypertension   . Hypothyroidism   . Ischemic cardiomyopathy June 2009   managed medically; EF is 20 to 25%  . Left bundle branch block   . Left heart failure (HCC)    EF is 20 to 25%; She is managed conservatively  . Stroke (HCC)   . Subendocardial myocardial infarction [facility]) June 2009    Past Surgical History:  Procedure Laterality Date  . CATARACT EXTRACTION  09/18/2012   right eye  . DILATION AND CURETTAGE OF UTERUS    . OVARIAN CYST REMOVAL     2007-2008    Family History  Problem Relation Age of Onset  . Aneurysm Father        ruptured  . Heart attack Mother   . Heart defect Mother        enlarged heart    Social History   Tobacco Use  . Smoking status: Former Smoker    Last attempt to quit: 07/11/1968    Years since quitting: 50.2  . Smokeless tobacco: Never Used  Substance Use Topics  . Alcohol use: No  . Drug use: No    Prior to Admission medications   Medication Sig Start Date End Date Taking? Authorizing Provider  acetaminophen (TYLENOL)  500 MG tablet Take 1 tablet (500 mg total) by mouth every 6 (six) hours as needed for mild pain, moderate pain, fever or headache. 06/02/17  Yes Hedges, Tinnie Gens, PA-C  amoxicillin-clavulanate (AUGMENTIN) 500-125 MG tablet Take 1 tablet by mouth 2 (two) times daily. 09/10/18  Yes [provider]  atorvastatin (LIPITOR) 10 MG tablet Take 1 tablet (10 mg total) by mouth daily. 06/14/17  Yes Medina-Vargas, Monina C, NP  cholecalciferol (VITAMIN D) 1000 units tablet Take 1 tablet (1,000 Units total) by mouth daily. 06/14/17  Yes Medina-Vargas, Monina C, NP  digoxin (DIGITEK) 0.125 MG tablet TAKE 1 TABLET (0.125 MG TOTAL) BY MOUTH DAILY. Patient taking differently: Take 0.125 mg by mouth daily.  06/14/17  Yes Medina-Vargas, Monina C, NP  furosemide (LASIX) 20 MG tablet Take 1 tablet (20 mg total) by mouth daily. Patient taking differently: Take 40 mg by mouth daily.  06/14/17  Yes Medina-Vargas, Monina C, NP  levothyroxine (SYNTHROID, LEVOTHROID) 75 MCG tablet Take 75 mcg by mouth daily before breakfast. 08/24/18  Yes [provider]  Multiple Vitamins-Minerals (CENTRUM SILVER) tablet Take 1 tablet by mouth daily. 06/02/17  Yes Hedges, Tinnie Gens, PA-C  nitroGLYCERIN (NITRODUR - DOSED IN MG/24 HR) 0.2 mg/hr patch PLACE ONE PATCH ONTO SKIN  ONCE DAILY AT BEDTIME Patient taking differently: Place 0.2 mg onto the skin at bedtime.  06/14/17  Yes Medina-Vargas, Monina C, NP  nitroGLYCERIN (NITROSTAT) 0.4 MG SL tablet Place 1 tablet (0.4 mg total) under the tongue every 5 (five) minutes as needed for chest pain. 06/14/17  Yes Medina-Vargas, Monina C, NP  potassium chloride (K-DUR) 10 MEQ tablet TAKE 1 TABLET (10 MEQ TOTAL) BY MOUTH DAILY. Patient taking differently: Take 10 mEq by mouth daily.  06/14/17  Yes Medina-Vargas, Monina C, NP  SYNTHROID 50 MCG tablet Take 1 tablet (50 mcg total) by mouth daily. Patient not taking: Reported on 09/17/2018 06/14/17   Medina-Vargas, Monina C, NP    Allergies Procaine  hcl   REVIEW OF SYSTEMS  Negative except as noted here or in the History of Present Illness.   PHYSICAL EXAMINATION  Initial Vital Signs Blood pressure 123/73, pulse 81, temperature 98.2 F (36.8 C), temperature source Oral, resp. rate 16, height 5\' 6"  (1.676 m), weight 56.7 kg, SpO2 96 %.  Examination General: Well-developed, well-nourished female in no acute distress; appearance consistent with age of record HENT: normocephalic; atraumatic Eyes: pupils equal, round; bilateral pseudophakia Neck: supple Heart: regular rate and rhythm Lungs: clear to auscultation bilaterally Abdomen: soft; nondistended; nontender; bowel sounds present Extremities: No deformity; full range of motion; pulses normal Neurologic: Awake, alert and oriented; motor function intact in all extremities and symmetric; no facial droop; hard of hearing Skin: Warm and dry Psychiatric: Normal mood and affect   RESULTS  Summary of this visit's results, reviewed by myself:   EKG Interpretation  Date/Time:    Ventricular Rate:    PR Interval:    QRS Duration:   QT Interval:    QTC Calculation:   R Axis:     Text Interpretation:        Laboratory Studies: Results for orders placed or performed during the hospital encounter of 09/16/18 (from the past 24 hour(s))  CBC with Differential/Platelet     Status: Abnormal   Collection Time: 09/17/18 12:13 AM  Result Value Ref Range   WBC 7.8 4.0 - 10.5 K/uL   RBC 4.15 3.87 - 5.11 MIL/uL   Hemoglobin 12.7 12.0 - 15.0 g/dL   HCT 16.1 09.6 - 04.5 %   MCV 96.4 80.0 - 100.0 fL   MCH 30.6 26.0 - 34.0 pg   MCHC 31.8 30.0 - 36.0 g/dL   RDW 40.9 81.1 - 91.4 %   Platelets 124 (L) 150 - 400 K/uL   nRBC 0.0 0.0 - 0.2 %   Neutrophils Relative % 71 %   Neutro Abs 5.6 1.7 - 7.7 K/uL   Lymphocytes Relative 20 %   Lymphs Abs 1.6 0.7 - 4.0 K/uL   Monocytes Relative 7 %   Monocytes Absolute 0.6 0.1 - 1.0 K/uL   Eosinophils Relative 1 %   Eosinophils Absolute 0.0 0.0 -  0.5 K/uL   Basophils Relative 0 %   Basophils Absolute 0.0 0.0 - 0.1 K/uL   Immature Granulocytes 1 %   Abs Immature Granulocytes 0.04 0.00 - 0.07 K/uL  Basic metabolic panel     Status: Abnormal   Collection Time: 09/17/18 12:13 AM  Result Value Ref Range   Sodium 140 135 - 145 mmol/L   Potassium 3.6 3.5 - 5.1 mmol/L   Chloride 104 98 - 111 mmol/L   CO2 25 22 - 32 mmol/L   Glucose, Bld 110 (H) 70 - 99 mg/dL   BUN 28 (H) 8 -  23 mg/dL   Creatinine, Ser 4.091.38 (H) 0.44 - 1.00 mg/dL   Calcium 8.5 (L) 8.9 - 10.3 mg/dL   GFR calc non Af Amer 31 (L) >60 mL/min   GFR calc Af Amer 36 (L) >60 mL/min   Anion gap 11 5 - 15  Urinalysis, Routine w reflex microscopic     Status: None   Collection Time: 09/17/18  2:58 AM  Result Value Ref Range   Color, Urine YELLOW YELLOW   APPearance CLEAR CLEAR   Specific Gravity, Urine 1.015 1.005 - 1.030   pH 5.0 5.0 - 8.0   Glucose, UA NEGATIVE NEGATIVE mg/dL   Hgb urine dipstick NEGATIVE NEGATIVE   Bilirubin Urine NEGATIVE NEGATIVE   Ketones, ur NEGATIVE NEGATIVE mg/dL   Protein, ur NEGATIVE NEGATIVE mg/dL   Nitrite NEGATIVE NEGATIVE   Leukocytes,Ua NEGATIVE NEGATIVE   Imaging Studies: No results found.  ED COURSE and MDM  Nursing notes and initial vitals signs, including pulse oximetry, reviewed.  Vitals:   09/17/18 0100 09/17/18 0130 09/17/18 0200 09/17/18 0230  BP: 135/74 125/69 124/73 132/70  Pulse: 76 79 81 77  Resp: (!) 22 (!) 23 20 (!) 22  Temp:      TempSrc:      SpO2: 94% 94% 93% 94%  Weight:      Height:       4:00 AM The patient has had no diarrhea in the ED and has no acute complaint.  She is drinking water without difficulty.  I see no indication for further work-up or admission.  PROCEDURES    ED DIAGNOSES     ICD-10-CM   1. Diarrhea of infectious origin A09        Beaumont Austad, MD 09/17/18 [id number]

## 2018-09-16 NOTE — ED Notes (Signed)
Bed: OI75 Expected date:  Expected time:  Means of arrival:  Comments: 89 F abd pain from [facility]

## 2018-09-16 NOTE — ED Triage Notes (Signed)
Pt presents from [facility] by [facility] for evaluation of nausea and diarrhea that started around noon today. Pt reports having one large episode of diarrhea but multiple residents of [facility] have been having same symptoms since yesterday.

## 2018-09-17 DIAGNOSIS — A09 Infectious gastroenteritis and colitis, unspecified: Secondary | ICD-10-CM | POA: Diagnosis not present

## 2018-09-17 LAB — CBC WITH DIFFERENTIAL/PLATELET
Abs Immature Granulocytes: 0.04 10*3/uL (ref 0.00–0.07)
BASOS ABS: 0 10*3/uL (ref 0.0–0.1)
Basophils Relative: 0 %
Eosinophils Absolute: 0 10*3/uL (ref 0.0–0.5)
Eosinophils Relative: 1 %
HEMATOCRIT: 40 % (ref 36.0–46.0)
HEMOGLOBIN: 12.7 g/dL (ref 12.0–15.0)
IMMATURE GRANULOCYTES: 1 %
LYMPHS ABS: 1.6 10*3/uL (ref 0.7–4.0)
LYMPHS PCT: 20 %
MCH: 30.6 pg (ref 26.0–34.0)
MCHC: 31.8 g/dL (ref 30.0–36.0)
MCV: 96.4 fL (ref 80.0–100.0)
Monocytes Absolute: 0.6 10*3/uL (ref 0.1–1.0)
Monocytes Relative: 7 %
NRBC: 0 % (ref 0.0–0.2)
Neutro Abs: 5.6 10*3/uL (ref 1.7–7.7)
Neutrophils Relative %: 71 %
Platelets: 124 10*3/uL — ABNORMAL LOW (ref 150–400)
RBC: 4.15 MIL/uL (ref 3.87–5.11)
RDW: 13.5 % (ref 11.5–15.5)
WBC: 7.8 10*3/uL (ref 4.0–10.5)

## 2018-09-17 LAB — URINALYSIS, ROUTINE W REFLEX MICROSCOPIC
BILIRUBIN URINE: NEGATIVE
Glucose, UA: NEGATIVE mg/dL
Hgb urine dipstick: NEGATIVE
Ketones, ur: NEGATIVE mg/dL
LEUKOCYTE UA: NEGATIVE
NITRITE: NEGATIVE
Protein, ur: NEGATIVE mg/dL
SPECIFIC GRAVITY, URINE: 1.015 (ref 1.005–1.030)
pH: 5 (ref 5.0–8.0)

## 2018-09-17 LAB — BASIC METABOLIC PANEL
Anion gap: 11 (ref 5–15)
BUN: 28 mg/dL — ABNORMAL HIGH (ref 8–23)
CHLORIDE: 104 mmol/L (ref 98–111)
CO2: 25 mmol/L (ref 22–32)
CREATININE: 1.38 mg/dL — AB (ref 0.44–1.00)
Calcium: 8.5 mg/dL — ABNORMAL LOW (ref 8.9–10.3)
GFR calc non Af Amer: 31 mL/min — ABNORMAL LOW (ref >60)
GFR, EST AFRICAN AMERICAN: 36 mL/min — AB (ref >60)
Glucose, Bld: 110 mg/dL — ABNORMAL HIGH (ref 70–99)
POTASSIUM: 3.6 mmol/L (ref 3.5–5.1)
SODIUM: 140 mmol/L (ref 135–145)

## 2018-09-17 NOTE — ED Notes (Signed)
[facility] [employer] notified of need for transport of pt back to residence.  

## 2018-09-17 NOTE — ED Notes (Signed)
Pt instructed on discharge information. Pt has had no episodes of diarrhea or vomiting during time in ED. Currently awaiting transport back to facility.

## 2019-03-16 IMAGING — CR DG WRIST COMPLETE 3+V*L*
4 series · 4 of 4 positions shown · non-contrast
Comparison: None.

CLINICAL DATA: Patient fell after tripping on sidewalk.

EXAM:
LEFT WRIST - COMPLETE 3+ VIEW

[x wrist pa left]
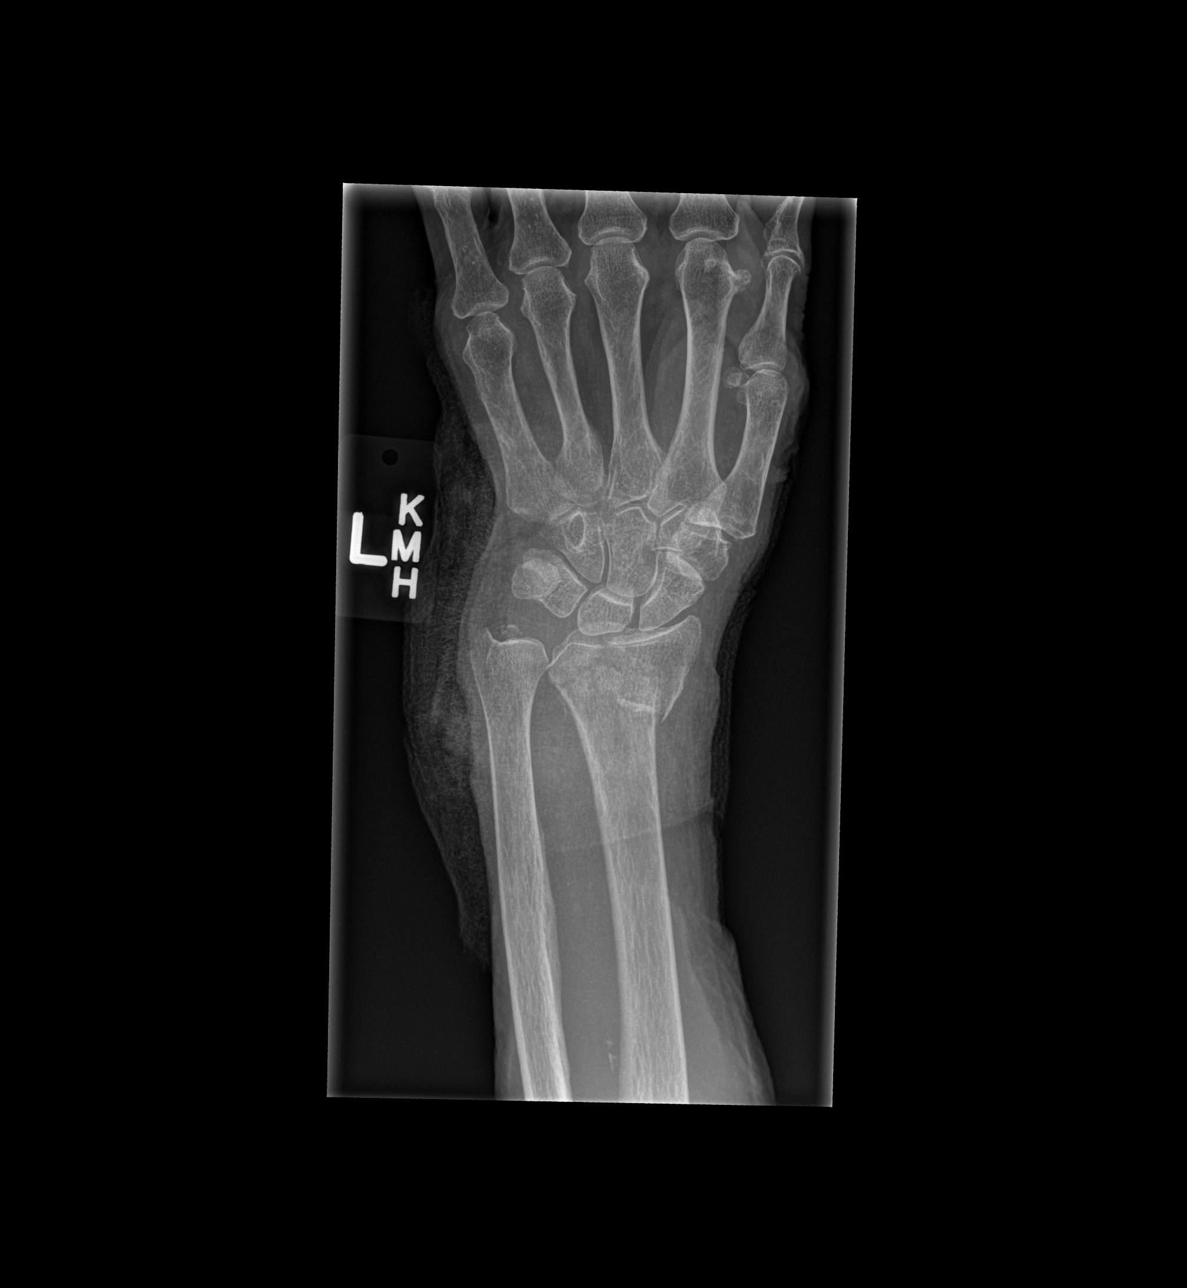

[x wrist obl left]
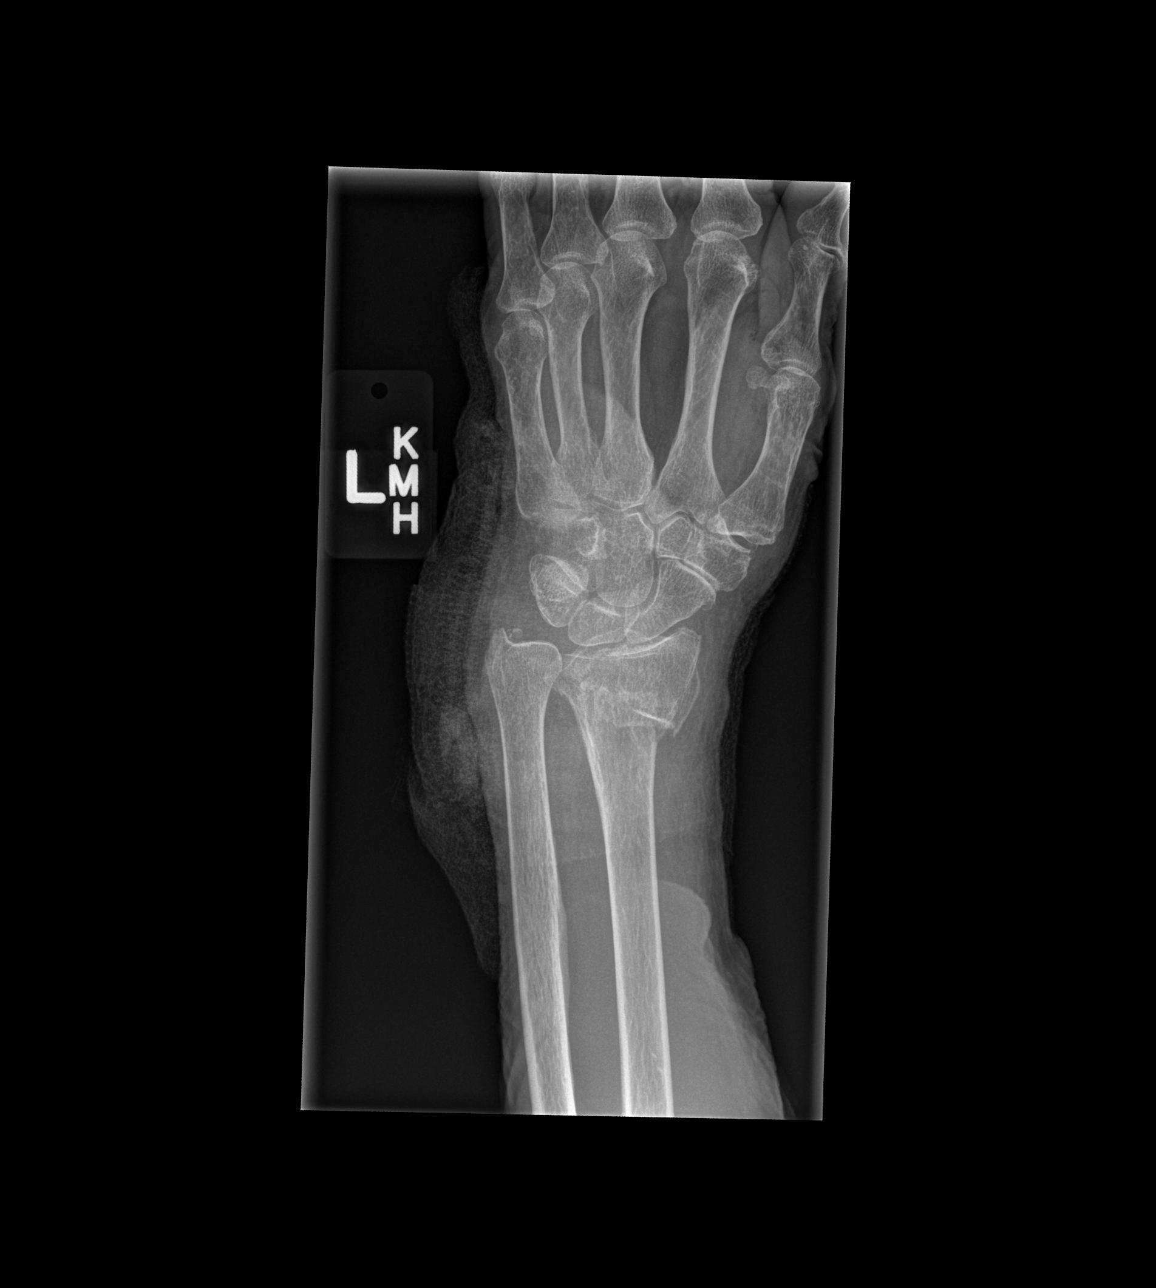

[x wrist lat left]
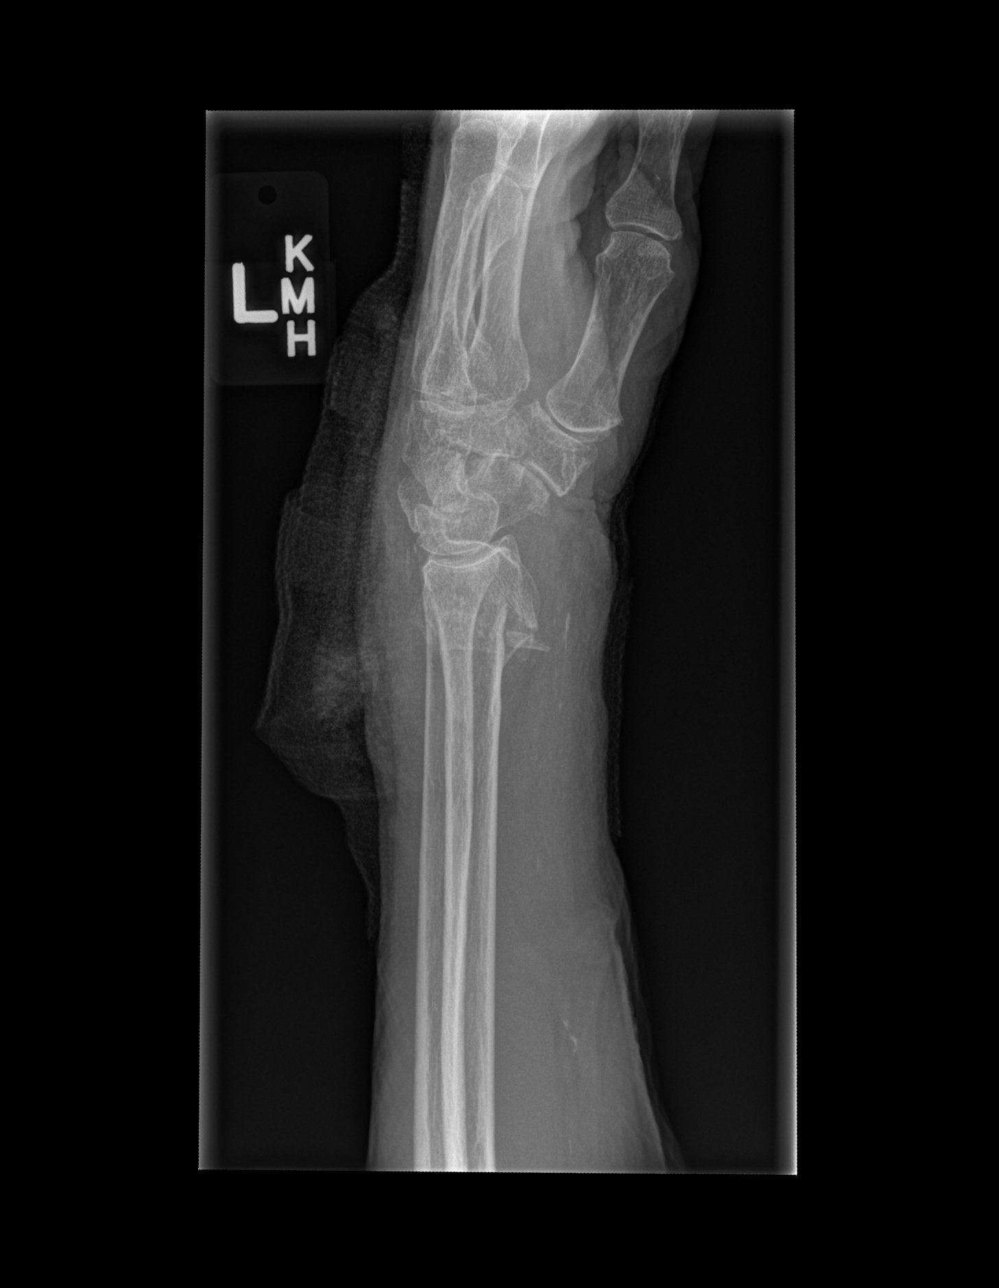

[x wrist navicular view left]
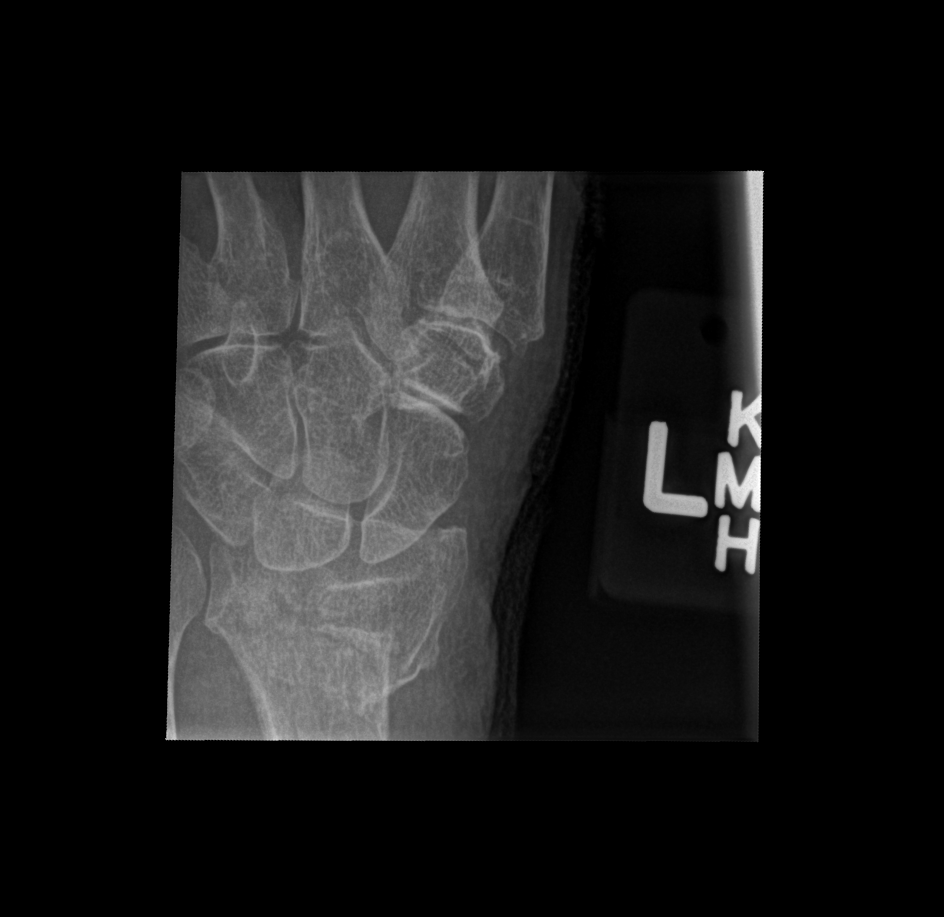

[4 of 4 positions shown; findings below may reference images not displayed]

FINDINGS: There is an acute, closed, comminuted fracture of the distal radial
metaphysis and epiphysis extending into the radiocarpal joint at the
level of the lunate and also with extension into the distal
radioulnar joint. Slight radial displacement of the main fracture
fragment with slight dorsal angulation is noted. Fracture of the
ulnar styloid is also seen. Osteoarthritic joint space narrowing of
the triscaphe and first CMC joints. No fracture of the carpal bones.
Bandaging limits fine bony detail about the wrist and distal
forearm.
IMPRESSION: 1. Acute, closed, comminuted fracture of the distal radial
metaphysis and epiphysis with extension of fracture into the
radiocarpal and distal radioulnar joints as above described. Slight
radial and dorsal angulation of the main distal radius fracture
fragment.
2. Acute ulnar styloid fracture.
3. First CMC and triscaphe joint osteoarthritis.

## 2019-07-31 ENCOUNTER — Emergency Department (HOSPITAL_COMMUNITY): Payer: Medicare Other

## 2019-07-31 ENCOUNTER — Other Ambulatory Visit: Payer: Self-pay

## 2019-07-31 ENCOUNTER — Inpatient Hospital Stay (HOSPITAL_COMMUNITY)
Admission: EM | Admit: 2019-07-31 | Discharge: 2019-08-05 | DRG: 177 | Disposition: A | Discharge status: Home Health | Payer: Medicare Other | Source: Skilled Nursing Facility | Attending: Internal Medicine | Admitting: Internal Medicine

## 2019-07-31 ENCOUNTER — Encounter (HOSPITAL_COMMUNITY): Payer: Self-pay

## 2019-07-31 DIAGNOSIS — E785 Hyperlipidemia, unspecified: Secondary | ICD-10-CM | POA: Diagnosis present

## 2019-07-31 DIAGNOSIS — I5020 Unspecified systolic (congestive) heart failure: Secondary | ICD-10-CM | POA: Diagnosis not present

## 2019-07-31 DIAGNOSIS — Z515 Encounter for palliative care: Secondary | ICD-10-CM | POA: Diagnosis not present

## 2019-07-31 DIAGNOSIS — J9601 Acute respiratory failure with hypoxia: Secondary | ICD-10-CM | POA: Diagnosis present

## 2019-07-31 DIAGNOSIS — Z8674 Personal history of sudden cardiac arrest: Secondary | ICD-10-CM | POA: Diagnosis not present

## 2019-07-31 DIAGNOSIS — I255 Ischemic cardiomyopathy: Secondary | ICD-10-CM | POA: Diagnosis present

## 2019-07-31 DIAGNOSIS — R54 Age-related physical debility: Secondary | ICD-10-CM | POA: Diagnosis not present

## 2019-07-31 DIAGNOSIS — Z79890 Hormone replacement therapy (postmenopausal): Secondary | ICD-10-CM | POA: Diagnosis not present

## 2019-07-31 DIAGNOSIS — Z66 Do not resuscitate: Secondary | ICD-10-CM | POA: Diagnosis present

## 2019-07-31 DIAGNOSIS — I130 Hypertensive heart and chronic kidney disease with heart failure and stage 1 through stage 4 chronic kidney disease, or unspecified chronic kidney disease: Secondary | ICD-10-CM | POA: Diagnosis present

## 2019-07-31 DIAGNOSIS — Z8249 Family history of ischemic heart disease and other diseases of the circulatory system: Secondary | ICD-10-CM

## 2019-07-31 DIAGNOSIS — G309 Alzheimer's disease, unspecified: Secondary | ICD-10-CM | POA: Diagnosis present

## 2019-07-31 DIAGNOSIS — Z8673 Personal history of transient ischemic attack (TIA), and cerebral infarction without residual deficits: Secondary | ICD-10-CM

## 2019-07-31 DIAGNOSIS — E039 Hypothyroidism, unspecified: Secondary | ICD-10-CM | POA: Diagnosis present

## 2019-07-31 DIAGNOSIS — H9190 Unspecified hearing loss, unspecified ear: Secondary | ICD-10-CM | POA: Diagnosis present

## 2019-07-31 DIAGNOSIS — I5042 Chronic combined systolic (congestive) and diastolic (congestive) heart failure: Secondary | ICD-10-CM | POA: Diagnosis present

## 2019-07-31 DIAGNOSIS — Z79899 Other long term (current) drug therapy: Secondary | ICD-10-CM | POA: Diagnosis not present

## 2019-07-31 DIAGNOSIS — D631 Anemia in chronic kidney disease: Secondary | ICD-10-CM | POA: Diagnosis present

## 2019-07-31 DIAGNOSIS — J1282 Pneumonia due to coronavirus disease 2019: Secondary | ICD-10-CM | POA: Diagnosis present

## 2019-07-31 DIAGNOSIS — I252 Old myocardial infarction: Secondary | ICD-10-CM

## 2019-07-31 DIAGNOSIS — F0280 Dementia in other diseases classified elsewhere without behavioral disturbance: Secondary | ICD-10-CM | POA: Diagnosis present

## 2019-07-31 DIAGNOSIS — R0902 Hypoxemia: Secondary | ICD-10-CM

## 2019-07-31 DIAGNOSIS — Z87891 Personal history of nicotine dependence: Secondary | ICD-10-CM

## 2019-07-31 DIAGNOSIS — Z886 Allergy status to analgesic agent status: Secondary | ICD-10-CM

## 2019-07-31 DIAGNOSIS — N1831 Chronic kidney disease, stage 3a: Secondary | ICD-10-CM | POA: Diagnosis present

## 2019-07-31 DIAGNOSIS — U071 COVID-19: Secondary | ICD-10-CM | POA: Diagnosis present

## 2019-07-31 DIAGNOSIS — N179 Acute kidney failure, unspecified: Secondary | ICD-10-CM | POA: Diagnosis present

## 2019-07-31 LAB — CBC WITH DIFFERENTIAL/PLATELET
Abs Immature Granulocytes: 0.07 10*3/uL (ref 0.00–0.07)
Basophils Absolute: 0 10*3/uL (ref 0.0–0.1)
Basophils Relative: 0 %
Eosinophils Absolute: 0 10*3/uL (ref 0.0–0.5)
Eosinophils Relative: 0 %
HCT: 39.1 % (ref 36.0–46.0)
Hemoglobin: 12.7 g/dL (ref 12.0–15.0)
Immature Granulocytes: 1 %
Lymphocytes Relative: 48 %
Lymphs Abs: 3.8 10*3/uL (ref 0.7–4.0)
MCH: 31 pg (ref 26.0–34.0)
MCHC: 32.5 g/dL (ref 30.0–36.0)
MCV: 95.4 fL (ref 80.0–100.0)
Monocytes Absolute: 0.8 10*3/uL (ref 0.1–1.0)
Monocytes Relative: 10 %
Neutro Abs: 3.3 10*3/uL (ref 1.7–7.7)
Neutrophils Relative %: 41 %
Platelets: 112 10*3/uL — ABNORMAL LOW (ref 150–400)
RBC: 4.1 MIL/uL (ref 3.87–5.11)
RDW: 14.1 % (ref 11.5–15.5)
WBC: 8.1 10*3/uL (ref 4.0–10.5)
nRBC: 0 % (ref 0.0–0.2)

## 2019-07-31 LAB — COMPREHENSIVE METABOLIC PANEL
ALT: 21 U/L (ref 0–44)
AST: 33 U/L (ref 15–41)
Albumin: 4 g/dL (ref 3.5–5.0)
Alkaline Phosphatase: 49 U/L (ref 38–126)
Anion gap: 11 (ref 5–15)
BUN: 40 mg/dL — ABNORMAL HIGH (ref 8–23)
CO2: 30 mmol/L (ref 22–32)
Calcium: 8.5 mg/dL — ABNORMAL LOW (ref 8.9–10.3)
Chloride: 99 mmol/L (ref 98–111)
Creatinine, Ser: 1.6 mg/dL — ABNORMAL HIGH (ref 0.44–1.00)
GFR calc Af Amer: 30 mL/min — ABNORMAL LOW (ref >60)
GFR calc non Af Amer: 26 mL/min — ABNORMAL LOW (ref >60)
Glucose, Bld: 96 mg/dL (ref 70–99)
Potassium: 4.4 mmol/L (ref 3.5–5.1)
Sodium: 140 mmol/L (ref 135–145)
Total Bilirubin: 1.9 mg/dL — ABNORMAL HIGH (ref 0.3–1.2)
Total Protein: 6.7 g/dL (ref 6.5–8.1)

## 2019-07-31 LAB — D-DIMER, QUANTITATIVE: D-Dimer, Quant: 0.99 ug/mL-FEU — ABNORMAL HIGH (ref 0.00–0.50)

## 2019-07-31 LAB — FERRITIN: Ferritin: 414 ng/mL — ABNORMAL HIGH (ref 11–307)

## 2019-07-31 LAB — LACTIC ACID, PLASMA
Lactic Acid, Venous: 1.4 mmol/L (ref 0.5–1.9)
Lactic Acid, Venous: 1.1 mmol/L (ref 0.5–1.9)

## 2019-07-31 LAB — POC SARS CORONAVIRUS 2 AG -  ED: SARS Coronavirus 2 Ag: POSITIVE — AB

## 2019-07-31 LAB — FIBRINOGEN: Fibrinogen: 552 mg/dL — ABNORMAL HIGH (ref 210–475)

## 2019-07-31 LAB — PROCALCITONIN: Procalcitonin: <0.1 ng/mL

## 2019-07-31 LAB — ABO/RH: ABO/RH(D): O POS

## 2019-07-31 LAB — TRIGLYCERIDES: Triglycerides: 153 mg/dL — ABNORMAL HIGH (ref <150)

## 2019-07-31 LAB — C-REACTIVE PROTEIN: CRP: 1.7 mg/dL — ABNORMAL HIGH (ref <1.0)

## 2019-07-31 LAB — LACTATE DEHYDROGENASE: LDH: 187 U/L (ref 98–192)

## 2019-07-31 MED ORDER — DIGOXIN 125 MCG PO TABS
0.1250 mg | ORAL_TABLET | Freq: Every day | ORAL | Status: DC
  Administered 2019-08-01: 0.125 mg via ORAL
  Filled 2019-07-31: qty 1

## 2019-07-31 MED ORDER — SODIUM CHLORIDE 0.9 % IV SOLN
100.0000 mg | Freq: Every day | INTRAVENOUS | Status: AC
  Administered 2019-08-01 – 2019-08-04 (×4): 100 mg via INTRAVENOUS
  Filled 2019-07-31 (×4): qty 20

## 2019-07-31 MED ORDER — VITAMIN D3 25 MCG (1000 UNIT) PO TABS
2000.0000 [IU] | ORAL_TABLET | Freq: Every day | ORAL | Status: DC
  Administered 2019-08-01 – 2019-08-05 (×5): 2000 [IU] via ORAL
  Filled 2019-07-31 (×8): qty 2

## 2019-07-31 MED ORDER — HYDROCOD POLST-CPM POLST ER 10-8 MG/5ML PO SUER: 5.0000 mL | Freq: Two times a day (BID) | ORAL | Status: DC | PRN

## 2019-07-31 MED ORDER — LEVOTHYROXINE SODIUM 50 MCG PO TABS
75.0000 ug | ORAL_TABLET | Freq: Every day | ORAL | Status: DC
  Administered 2019-08-01 – 2019-08-05 (×4): 75 ug via ORAL
  Filled 2019-07-31 (×4): qty 1

## 2019-07-31 MED ORDER — ENOXAPARIN SODIUM 40 MG/0.4ML ~~LOC~~ SOLN: 40.0000 mg | SUBCUTANEOUS | Status: DC

## 2019-07-31 MED ORDER — ONDANSETRON HCL 4 MG/2ML IJ SOLN
4.0000 mg | Freq: Four times a day (QID) | INTRAMUSCULAR | Status: DC | PRN
  Administered 2019-08-01: 21:00:00 4 mg via INTRAVENOUS
  Filled 2019-07-31: qty 2

## 2019-07-31 MED ORDER — ALBUTEROL SULFATE HFA 108 (90 BASE) MCG/ACT IN AERS
2.0000 | INHALATION_SPRAY | Freq: Four times a day (QID) | RESPIRATORY_TRACT | Status: DC
  Administered 2019-08-01 – 2019-08-03 (×10): 2 via RESPIRATORY_TRACT
  Filled 2019-07-31 (×2): qty 6.7

## 2019-07-31 MED ORDER — ATORVASTATIN CALCIUM 10 MG PO TABS
10.0000 mg | ORAL_TABLET | Freq: Every day | ORAL | Status: DC
  Administered 2019-08-01 – 2019-08-05 (×5): 10 mg via ORAL
  Filled 2019-07-31 (×5): qty 1

## 2019-07-31 MED ORDER — HEPARIN SODIUM (PORCINE) 5000 UNIT/ML IJ SOLN
5000.0000 [IU] | Freq: Three times a day (TID) | INTRAMUSCULAR | Status: DC
  Administered 2019-07-31 – 2019-08-05 (×13): 5000 [IU] via SUBCUTANEOUS
  Filled 2019-07-31 (×13): qty 1

## 2019-07-31 MED ORDER — DIPHENOXYLATE-ATROPINE 2.5-0.025 MG PO TABS
1.0000 | ORAL_TABLET | Freq: Every day | ORAL | Status: DC
  Administered 2019-08-01 – 2019-08-05 (×5): 1 via ORAL
  Filled 2019-07-31 (×5): qty 1

## 2019-07-31 MED ORDER — ADULT MULTIVITAMIN W/MINERALS CH
1.0000 | ORAL_TABLET | Freq: Every day | ORAL | Status: DC
  Administered 2019-08-01 – 2019-08-05 (×5): 1 via ORAL
  Filled 2019-07-31 (×5): qty 1

## 2019-07-31 MED ORDER — LACTATED RINGERS IV BOLUS
500.0000 mL | Freq: Once | INTRAVENOUS | Status: AC
  Administered 2019-07-31: 20:00:00 500 mL via INTRAVENOUS

## 2019-07-31 MED ORDER — GUAIFENESIN-DM 100-10 MG/5ML PO SYRP: 10.0000 mL | ORAL_SOLUTION | ORAL | Status: DC | PRN

## 2019-07-31 MED ORDER — DEXAMETHASONE 4 MG PO TABS
6.0000 mg | ORAL_TABLET | ORAL | Status: DC
  Administered 2019-07-31 – 2019-08-04 (×5): 6 mg via ORAL
  Filled 2019-07-31 (×4): qty 1
  Filled 2019-07-31 (×2): qty 2

## 2019-07-31 MED ORDER — ACETAMINOPHEN 325 MG PO TABS
650.0000 mg | ORAL_TABLET | Freq: Four times a day (QID) | ORAL | Status: DC | PRN
  Administered 2019-08-04 – 2019-08-05 (×2): 650 mg via ORAL
  Filled 2019-07-31 (×2): qty 2

## 2019-07-31 MED ORDER — ONDANSETRON HCL 4 MG PO TABS: 4.0000 mg | ORAL_TABLET | Freq: Four times a day (QID) | ORAL | Status: DC | PRN

## 2019-07-31 MED ORDER — MECLIZINE HCL 25 MG PO TABS: 12.5000 mg | ORAL_TABLET | Freq: Two times a day (BID) | ORAL | Status: DC | PRN

## 2019-07-31 MED ORDER — FAMOTIDINE 20 MG PO TABS
20.0000 mg | ORAL_TABLET | Freq: Every day | ORAL | Status: DC
  Administered 2019-07-31 – 2019-08-04 (×5): 20 mg via ORAL
  Filled 2019-07-31 (×5): qty 1

## 2019-07-31 MED ORDER — SODIUM CHLORIDE 0.9 % IV SOLN
200.0000 mg | Freq: Once | INTRAVENOUS | Status: AC
  Administered 2019-07-31: 200 mg via INTRAVENOUS
  Filled 2019-07-31: qty 200

## 2019-07-31 NOTE — ED Triage Notes (Signed)
[facility] facility via EMS COVID + on 07/28/19 per facility Increased weakness, low appetite, and dehydration past 3 days  Hx: Alzheimer's, short term memory impaired No complaints, no pain, no respiratory distress noted per EMS  Clear lungs per EMS  AOx3 128/70 86 HR EKG: 1st degree heart block - asymptomatic  100.0 temp  94% RA

## 2019-07-31 NOTE — Plan of Care (Signed)

## 2019-07-31 NOTE — Progress Notes (Signed)
Pharmacy consulted to evaluate patient for appropriateness of bamlanivimab or Regeneron for treatment of COVID. Due to the limitations of the FDA's Emergency Use Authorization, these drugs cannot be used off-label outside of a clinical trial.  Bernadene Person, PharmD, BCPS [phone] 07/31/2019, 9:21 PM

## 2019-07-31 NOTE — H&P (Signed)
History and Physical    [name] [name] DOB: 03-Dec-1916 DOA: 07/31/2019  PCP: Burton Apley, MD  Patient coming from: ALF  I have personally briefly reviewed patient's old medical records in [facility]  Chief Complaint: COVID  HPI: [name] is a 86 y.o. female with medical history significant of ICM with EF 25-30%, mild dementia, advanced age.  Patient COVID+ reportedly on 1/17.  Was doing okay until yesterday when she started developing symptoms of diarrhea, decreased appetitie, SOB.  Today SOB worse, in to ED.   ED Course: Sats as low as 87% on RA, put on 2L O2.  Tm 100.8.  COVID positive.  CXR with multifocal PNA.  WBC nl, procalcitonin neg.  CRP 1.7.  D.Dimer 0.99.   Review of Systems: As per HPI, otherwise all review of systems negative.  Past Medical History:  Diagnosis Date   Advanced age    Cardiac arrest [facility]) 2007   Occurred during planned ovarian surgery that resolved spontaneously after her stomach was deflated.    CVA (cerebral infarction) Oct 2010   "light stroke". Plavix started.   Grief    from husband's death   Hyperlipemia    Hypertension    Hypothyroidism    Ischemic cardiomyopathy June 2009   managed medically; EF is 20 to 25%   Left bundle branch block    Left heart failure (HCC)    EF is 20 to 25%; She is managed conservatively   Stroke [facility])    Subendocardial myocardial infarction [facility]) June 2009    Past Surgical History:  Procedure Laterality Date   CATARACT EXTRACTION  09/18/2012   right eye   DILATION AND CURETTAGE OF UTERUS     OVARIAN CYST REMOVAL     2007-2008     reports that she quit smoking about 51 years ago. She has never used smokeless tobacco. She reports that she does not drink alcohol or use drugs.  Allergies  Allergen Reactions   Procaine Hcl Hypertension    PASSED OUT    Family History  Problem Relation Age of Onset   Aneurysm Father        ruptured   Heart  attack Mother    Heart defect Mother        enlarged heart     Prior to Admission medications   Medication Sig Start Date End Date Taking? Authorizing Provider  acetaminophen (TYLENOL) 500 MG tablet Take 1 tablet (500 mg total) by mouth every 6 (six) hours as needed for mild pain, moderate pain, fever or headache. 06/02/17  Yes Hedges, Tinnie Gens, PA-C  acetaminophen (TYLENOL) 500 MG tablet Take 500 mg by mouth every 6 (six) hours as needed for moderate pain.   Yes [provider]  atorvastatin (LIPITOR) 10 MG tablet Take 1 tablet (10 mg total) by mouth daily. 06/14/17  Yes Medina-Vargas, Monina C, NP  cholecalciferol (VITAMIN D) 1000 units tablet Take 1 tablet (1,000 Units total) by mouth daily. Patient taking differently: Take 2,000 Units by mouth daily.  06/14/17  Yes Medina-Vargas, Monina C, NP  digoxin (DIGITEK) 0.125 MG tablet TAKE 1 TABLET (0.125 MG TOTAL) BY MOUTH DAILY. Patient taking differently: Take 0.125 mg by mouth daily.  06/14/17  Yes Medina-Vargas, Monina C, NP  diphenoxylate-atropine (LOMOTIL) 2.5-0.025 MG tablet Take 1 tablet by mouth daily.  07/23/19  Yes [provider]  furosemide (LASIX) 20 MG tablet Take 1 tablet (20 mg total) by mouth daily. Patient taking differently: Take 40 mg  by mouth daily.  06/14/17  Yes Medina-Vargas, Monina C, NP  levothyroxine (SYNTHROID, LEVOTHROID) 75 MCG tablet Take 75 mcg by mouth daily before breakfast. 08/24/18  Yes [provider]  meclizine (ANTIVERT) 25 MG tablet Take 12.5 mg by mouth 2 (two) times daily as needed for dizziness.  04/02/19  Yes [provider]  Multiple Vitamins-Minerals (CENTRUM SILVER) tablet Take 1 tablet by mouth daily. 06/02/17  Yes Hedges, Tinnie Gens, PA-C  nitroGLYCERIN (NITRODUR - DOSED IN MG/24 HR) 0.2 mg/hr patch PLACE ONE PATCH ONTO SKIN ONCE DAILY AT BEDTIME Patient taking differently: Place 0.2 mg onto the skin at bedtime.  06/14/17  Yes Medina-Vargas, Monina C, NP  nitroGLYCERIN  (NITROSTAT) 0.4 MG SL tablet Place 1 tablet (0.4 mg total) under the tongue every 5 (five) minutes as needed for chest pain. 06/14/17  Yes Medina-Vargas, Monina C, NP  potassium chloride (K-DUR) 10 MEQ tablet TAKE 1 TABLET (10 MEQ TOTAL) BY MOUTH DAILY. Patient taking differently: Take 10 mEq by mouth daily.  06/14/17  Yes Medina-Vargas, Monina C, NP    Physical Exam: Vitals:   07/31/19 1826 07/31/19 1827 07/31/19 1828 07/31/19 2003  BP:    119/72  Pulse: 76 76 74 76  Resp: 19 18 (!) 21 (!) 24  Temp:      TempSrc:      SpO2: 95% 97% 95% 95%  Weight:      Height:        Constitutional: NAD, calm, comfortable Eyes: PERRL, lids and conjunctivae normal ENMT: Mucous membranes are moist. Posterior pharynx clear of any exudate or lesions.Normal dentition.  Neck: normal, supple, no masses, no thyromegaly Respiratory: clear to auscultation bilaterally, no wheezing, no crackles. Normal respiratory effort. No accessory muscle use.  Cardiovascular: Regular rate and rhythm, no murmurs / rubs / gallops. No extremity edema. 2+ pedal pulses. No carotid bruits.  Abdomen: no tenderness, no masses palpated. No hepatosplenomegaly. Bowel sounds positive.  Musculoskeletal: no clubbing / cyanosis. No joint deformity upper and lower extremities. Good ROM, no contractures. Normal muscle tone.  Skin: no rashes, lesions, ulcers. No induration Neurologic: CN 2-12 grossly intact. Sensation intact, DTR normal. Strength 5/5 in all 4.  Psychiatric: Mild confusion.   Labs on Admission: I have personally reviewed following labs and imaging studies  CBC: Recent Labs  Lab 07/31/19 1749  WBC 8.1  NEUTROABS 3.3  HGB 12.7  HCT 39.1  MCV 95.4  PLT 112*   Basic Metabolic Panel: Recent Labs  Lab 07/31/19 1749  NA 140  K 4.4  CL 99  CO2 30  GLUCOSE 96  BUN 40*  CREATININE 1.60*  CALCIUM 8.5*   GFR: Estimated Creatinine Clearance: 16.5 mL/min (A) (by C-G formula based on SCr of 1.6 mg/dL (H)). Liver  Function Tests: Recent Labs  Lab 07/31/19 1749  AST 33  ALT 21  ALKPHOS 49  BILITOT 1.9*  PROT 6.7  ALBUMIN 4.0   No results for input(s): LIPASE, AMYLASE in the last 168 hours. No results for input(s): AMMONIA in the last 168 hours. Coagulation Profile: No results for input(s): INR, PROTIME in the last 168 hours. Cardiac Enzymes: No results for input(s): CKTOTAL, CKMB, CKMBINDEX, TROPONINI in the last 168 hours. BNP (last 3 results) No results for input(s): PROBNP in the last 8760 hours. HbA1C: No results for input(s): HGBA1C in the last 72 hours. CBG: No results for input(s): GLUCAP in the last 168 hours. Lipid Profile: Recent Labs    07/31/19 1749  TRIG 153*  Thyroid Function Tests: No results for input(s): TSH, T4TOTAL, FREET4, T3FREE, THYROIDAB in the last 72 hours. Anemia Panel: Recent Labs    07/31/19 1749  FERRITIN 414*   Urine analysis:    Component Value Date/Time   COLORURINE YELLOW 09/17/2018 0258   APPEARANCEUR CLEAR 09/17/2018 0258   LABSPEC 1.015 09/17/2018 0258   PHURINE 5.0 09/17/2018 0258   GLUCOSEU NEGATIVE 09/17/2018 0258   HGBUR NEGATIVE 09/17/2018 0258   BILIRUBINUR NEGATIVE 09/17/2018 0258   KETONESUR NEGATIVE 09/17/2018 0258   PROTEINUR NEGATIVE 09/17/2018 0258   UROBILINOGEN 0.2 04/10/2014 0745   NITRITE NEGATIVE 09/17/2018 0258   LEUKOCYTESUR NEGATIVE 09/17/2018 0258    Radiological Exams on Admission: DG Chest Port 1 View  Result Date: 07/31/2019 CLINICAL DATA:  Diagnosed as COVID-19 positive on 07/28/2019, increased weakness, decreased appetite, dehydration for 3 days, history Alzheimer's, stroke, coronary artery disease post MI, CHF, ischemic cardiomyopathy, hypertension EXAM: PORTABLE CHEST 1 VIEW COMPARISON:  Portable exam 1722 hours compared to 11/10/2014 FINDINGS: Upper normal heart size. Mediastinal contours and pulmonary vascularity normal. Atherosclerotic calcification aorta. Patchy infiltrates identified LEFT upper lobe  and RIGHT base consistent with multifocal pneumonia. No pleural effusion or pneumothorax. Bones demineralized. IMPRESSION: Patchy infiltrates LEFT upper lobe and RIGHT base consistent with multifocal pneumonia. Electronically Signed   By: Ulyses Southward M.D.   On: 07/31/2019 17:59    EKG: Independently reviewed.  Assessment/Plan Principal Problem:   Acute hypoxemic respiratory failure due to COVID-19 [facility]) Active Problems:   HFrEF (heart failure with reduced ejection fraction) (HCC)   Advanced age    1. Acute hypoxic resp failure due to COVID-19 - 1. COVID pathway 2. remdesivir 3. Decadron 4. Cont pulse ox 5. Daily labs 6. CRP only 1.7 so no actemra at this time. 7. Will put in pharm consult and see if shes a candidate for monoclonal AB therapy since so early in COVID diagnosis / course. 2. AKI - mild, creat 1.6 today from 1.38 last year 1. Hold lasix 2. Getting 500cc LR bolus in ED 3. Strict intake and output 4. Repeat BMP in AM 5. Will hold off on further IVF at the moment to try and preserve respiratory status. 3. Chronic systolic CHF - 1. Baseline EF 25-30%, though minimal CHF symptoms chronically 2. Hold lasix 3. More concerned that this is a major risk factor for COVID mortality 4. Advanced age  DVT prophylaxis: Lovenox Code Status: DNR/DNI confirmed with daughter Family Communication: Daughter on phone Disposition Plan: TBD Consults called: None Admission status: Admit to inpatient  Severity of Illness: The appropriate patient status for this patient is INPATIENT. Inpatient status is judged to be reasonable and necessary in order to provide the required intensity of service to ensure the patient's safety. The patient's presenting symptoms, physical exam findings, and initial radiographic and laboratory data in the context of their chronic comorbidities is felt to place them at high risk for further clinical deterioration. Furthermore, it is not anticipated that the  patient will be medically stable for discharge from the hospital within 2 midnights of admission. The following factors support the patient status of inpatient.   IP status due to new O2 requirement from IRWER-15   * I certify that at the point of admission it is my clinical judgment that the patient will require inpatient hospital care spanning beyond 2 midnights from the point of admission due to high intensity of service, high risk for further deterioration and high frequency of surveillance required.*    Carless Slatten  M. DO [facility]  How to contact the [facility] Attending or Consulting provider [place] or covering provider during after hours [place], for this patient?  1. Check the care team in [facility] and look for a) attending/consulting [facility] provider listed and b) the [facility] team listed 2. Log into [email or web address]  Amion Physician Scheduling and messaging for groups and whole hospitals  On call and physician scheduling software for group practices, residents, hospitalists and other medical providers for call, clinic, rotation and shift schedules. On[employer] is a hospital-wide system for scheduling doctors and paging doctors on call. EasyPlot is for scientific plotting and data analysis.  [email or web address]  and use [place]'s universal password to access. If you do not have the password, please contact the hospital operator.  3. Locate the [facility] provider you are looking for under [facility] and page to a number that you can be directly reached. 4. If you still have difficulty reaching the provider, please page the [facility] (Director on Call) for the Hospitalists listed on amion for assistance.  07/31/2019, 8:22 PM

## 2019-07-31 NOTE — ED Notes (Signed)
Helmut Muster (daughter) called by RN at this time and given patient updates.   Helmut Muster would like to be notified about future updates about her mother.  [phone]

## 2019-07-31 NOTE — ED Provider Notes (Signed)
[facility]-EMERGENCY DEPT Provider Note   CSN: [id number] Arrival date & time: 07/31/19  1651     History Chief Complaint  Patient presents with  . COVID +    [name] is a 86 y.o. female.  HPI Level 5 caveat due to dementia. Reportedly sent in from assisted living.  Reportedly positive Covid test 3 days ago but did not come with paperwork showing this.  Reportedly has had decreased appetite and possible dehydration.  Patient states she just feels bad.  Reported sats of 94% on room air, however for [place] it is between 87 and 92%.  Temperature of 100.8 rectally.     Past Medical History:  Diagnosis Date  . Advanced age   . Cardiac arrest [facility]) 2007   Occurred during planned ovarian surgery that resolved spontaneously after her stomach was deflated.   . CVA (cerebral infarction) Oct 2010   "light stroke". Plavix started.  . Grief    from husband's death  . Hyperlipemia   . Hypertension   . Hypothyroidism   . Ischemic cardiomyopathy June 2009   managed medically; EF is 20 to 25%  . Left bundle branch block   . Left heart failure (HCC)    EF is 20 to 25%; She is managed conservatively  . Stroke (HCC)   . Subendocardial myocardial infarction [facility]) June 2009    Patient Active Problem List   Diagnosis Date Noted  . Memory deficit 06/06/2017  . Abnormal EKG 06/06/2017  . Recurrent falls 06/05/2017  . Diarrhea 04/20/2017  . Colitis 04/20/2017  . HFrEF (heart failure with reduced ejection fraction) (HCC) 04/20/2017  . Hypokalemia 04/20/2017  . Elevated bilirubin 04/20/2017  . CVA (cerebral infarction) 08/04/2011  . Ischemic cardiomyopathy   . Hypothyroidism   . Hyperlipemia     Past Surgical History:  Procedure Laterality Date  . CATARACT EXTRACTION  09/18/2012   right eye  . DILATION AND CURETTAGE OF UTERUS    . OVARIAN CYST REMOVAL     2007-2008     OB History   No obstetric history on file.     Family History  Problem  Relation Age of Onset  . Aneurysm Father        ruptured  . Heart attack Mother   . Heart defect Mother        enlarged heart    Social History   Tobacco Use  . Smoking status: Former Smoker    Quit date: 07/11/1968    Years since quitting: 51.0  . Smokeless tobacco: Never Used  Substance Use Topics  . Alcohol use: No  . Drug use: No    Home Medications Prior to Admission medications   Medication Sig Start Date End Date Taking? Authorizing Provider  acetaminophen (TYLENOL) 500 MG tablet Take 1 tablet (500 mg total) by mouth every 6 (six) hours as needed for mild pain, moderate pain, fever or headache. 06/02/17  Yes Hedges, Tinnie Gens, PA-C  acetaminophen (TYLENOL) 500 MG tablet Take 500 mg by mouth every 6 (six) hours as needed for moderate pain.   Yes [provider]  atorvastatin (LIPITOR) 10 MG tablet Take 1 tablet (10 mg total) by mouth daily. 06/14/17  Yes Medina-Vargas, Monina C, NP  cholecalciferol (VITAMIN D) 1000 units tablet Take 1 tablet (1,000 Units total) by mouth daily. Patient taking differently: Take 2,000 Units by mouth daily.  06/14/17  Yes Medina-Vargas, Monina C, NP  digoxin (DIGITEK) 0.125 MG tablet TAKE 1 TABLET (0.125  MG TOTAL) BY MOUTH DAILY. Patient taking differently: Take 0.125 mg by mouth daily.  06/14/17  Yes Medina-Vargas, Monina C, NP  diphenoxylate-atropine (LOMOTIL) 2.5-0.025 MG tablet Take 1 tablet by mouth daily.  07/23/19  Yes [provider]  furosemide (LASIX) 20 MG tablet Take 1 tablet (20 mg total) by mouth daily. Patient taking differently: Take 40 mg by mouth daily.  06/14/17  Yes Medina-Vargas, Monina C, NP  levothyroxine (SYNTHROID, LEVOTHROID) 75 MCG tablet Take 75 mcg by mouth daily before breakfast. 08/24/18  Yes [provider]  meclizine (ANTIVERT) 25 MG tablet Take 12.5 mg by mouth 2 (two) times daily as needed for dizziness.  04/02/19  Yes [provider]  Multiple Vitamins-Minerals (CENTRUM SILVER) tablet  Take 1 tablet by mouth daily. 06/02/17  Yes Hedges, Tinnie Gens, PA-C  nitroGLYCERIN (NITRODUR - DOSED IN MG/24 HR) 0.2 mg/hr patch PLACE ONE PATCH ONTO SKIN ONCE DAILY AT BEDTIME Patient taking differently: Place 0.2 mg onto the skin at bedtime.  06/14/17  Yes Medina-Vargas, Monina C, NP  nitroGLYCERIN (NITROSTAT) 0.4 MG SL tablet Place 1 tablet (0.4 mg total) under the tongue every 5 (five) minutes as needed for chest pain. 06/14/17  Yes Medina-Vargas, Monina C, NP  potassium chloride (K-DUR) 10 MEQ tablet TAKE 1 TABLET (10 MEQ TOTAL) BY MOUTH DAILY. Patient taking differently: Take 10 mEq by mouth daily.  06/14/17  Yes Medina-Vargas, Monina C, NP    Allergies    Procaine hcl  Review of Systems   Review of Systems  Unable to perform ROS: Dementia  Constitutional: Positive for appetite change.    Physical Exam Updated Vital Signs BP 124/70   Pulse 74   Temp (!) 100.8 F (38.2 C) (Rectal)   Resp (!) 21   Ht 5' 6.5" (1.689 m)   Wt 55.8 kg   SpO2 95%   BMI 19.56 kg/m   Physical Exam Vitals reviewed.  HENT:     Head: Normocephalic.  Eyes:     Extraocular Movements: Extraocular movements intact.  Cardiovascular:     Rate and Rhythm: Regular rhythm.  Pulmonary:     Breath sounds: No wheezing, rhonchi or rales.  Abdominal:     Tenderness: There is no abdominal tenderness.  Musculoskeletal:     Right lower leg: No edema.     Left lower leg: No edema.  Skin:    General: Skin is warm.     Capillary Refill: Capillary refill takes less than 2 seconds.  Neurological:     Mental Status: She is alert.     Comments: Awake and pleasant but some confusion.     ED Results / Procedures / Treatments   Labs (all labs ordered are listed, but only abnormal results are displayed) Labs Reviewed  CBC WITH DIFFERENTIAL/PLATELET - Abnormal; Notable for the following components:      Result Value   Platelets 112 (*)    All other components within normal limits  COMPREHENSIVE METABOLIC PANEL  - Abnormal; Notable for the following components:   BUN 40 (*)    Creatinine, Ser 1.60 (*)    Calcium 8.5 (*)    Total Bilirubin 1.9 (*)    GFR calc non Af Amer 26 (*)    GFR calc Af Amer 30 (*)    All other components within normal limits  D-DIMER, QUANTITATIVE (NOT AT [facility]) - Abnormal; Notable for the following components:   D-Dimer, Quant 0.99 (*)    All other components within normal limits  TRIGLYCERIDES -  Abnormal; Notable for the following components:   Triglycerides 153 (*)    All other components within normal limits  FIBRINOGEN - Abnormal; Notable for the following components:   Fibrinogen 552 (*)    All other components within normal limits  POC SARS CORONAVIRUS 2 AG -  ED - Abnormal; Notable for the following components:   SARS Coronavirus 2 Ag POSITIVE (*)    All other components within normal limits  CULTURE, BLOOD (ROUTINE X 2)  CULTURE, BLOOD (ROUTINE X 2)  LACTIC ACID, PLASMA  LACTATE DEHYDROGENASE  LACTIC ACID, PLASMA  PROCALCITONIN  FERRITIN  C-REACTIVE PROTEIN    EKG None  Radiology DG Chest Port 1 View  Result Date: 07/31/2019 CLINICAL DATA:  Diagnosed as COVID-19 positive on 07/28/2019, increased weakness, decreased appetite, dehydration for 3 days, history Alzheimer's, stroke, coronary artery disease post MI, CHF, ischemic cardiomyopathy, hypertension EXAM: PORTABLE CHEST 1 VIEW COMPARISON:  Portable exam 1722 hours compared to 11/10/2014 FINDINGS: Upper normal heart size. Mediastinal contours and pulmonary vascularity normal. Atherosclerotic calcification aorta. Patchy infiltrates identified LEFT upper lobe and RIGHT base consistent with multifocal pneumonia. No pleural effusion or pneumothorax. Bones demineralized. IMPRESSION: Patchy infiltrates LEFT upper lobe and RIGHT base consistent with multifocal pneumonia. Electronically Signed   By: Lavonia Dana M.D.   On: 07/31/2019 17:59    Procedures Procedures (including critical care time)  Medications  Ordered in ED Medications  lactated ringers bolus 500 mL (has no administration in time range)    ED Course  I have reviewed the triage vital signs and the nursing notes.  Pertinent labs & imaging results that were available during my care of the patient were reviewed by me and considered in my medical decision making (see chart for details).    MDM Rules/Calculators/A&P                      Patient presents from assisted living.  Has room air sats between 87 and 92%.  Mild renal insufficiency.  Positive Covid test.  I think with the hypoxia patient benefit from admission to the hospital.  Will discuss with hospitalist. Final Clinical Impression(s) / ED Diagnoses Final diagnoses:  COVID-19  Hypoxia    Rx / DC Orders ED Discharge Orders    None       Davonna Belling, MD 07/31/19 1902

## 2019-07-31 NOTE — ED Notes (Signed)
Hospitalist at bedside 

## 2019-08-01 LAB — D-DIMER, QUANTITATIVE: D-Dimer, Quant: 0.84 ug/mL-FEU — ABNORMAL HIGH (ref 0.00–0.50)

## 2019-08-01 LAB — CBC WITH DIFFERENTIAL/PLATELET
Abs Immature Granulocytes: 0.05 10*3/uL (ref 0.00–0.07)
Basophils Absolute: 0 10*3/uL (ref 0.0–0.1)
Basophils Relative: 0 %
Eosinophils Absolute: 0 10*3/uL (ref 0.0–0.5)
Eosinophils Relative: 0 %
HCT: 34.7 % — ABNORMAL LOW (ref 36.0–46.0)
Hemoglobin: 11 g/dL — ABNORMAL LOW (ref 12.0–15.0)
Immature Granulocytes: 1 %
Lymphocytes Relative: 50 %
Lymphs Abs: 3.4 10*3/uL (ref 0.7–4.0)
MCH: 30.6 pg (ref 26.0–34.0)
MCHC: 31.7 g/dL (ref 30.0–36.0)
MCV: 96.4 fL (ref 80.0–100.0)
Monocytes Absolute: 0.6 10*3/uL (ref 0.1–1.0)
Monocytes Relative: 9 %
Neutro Abs: 2.7 10*3/uL (ref 1.7–7.7)
Neutrophils Relative %: 40 %
Platelets: 108 10*3/uL — ABNORMAL LOW (ref 150–400)
RBC: 3.6 MIL/uL — ABNORMAL LOW (ref 3.87–5.11)
RDW: 14.1 % (ref 11.5–15.5)
WBC: 6.8 10*3/uL (ref 4.0–10.5)
nRBC: 0 % (ref 0.0–0.2)

## 2019-08-01 LAB — C-REACTIVE PROTEIN: CRP: 2.1 mg/dL — ABNORMAL HIGH (ref <1.0)

## 2019-08-01 LAB — COMPREHENSIVE METABOLIC PANEL
ALT: 17 U/L (ref 0–44)
AST: 26 U/L (ref 15–41)
Albumin: 3.5 g/dL (ref 3.5–5.0)
Alkaline Phosphatase: 43 U/L (ref 38–126)
Anion gap: 12 (ref 5–15)
BUN: 40 mg/dL — ABNORMAL HIGH (ref 8–23)
CO2: 26 mmol/L (ref 22–32)
Calcium: 8.1 mg/dL — ABNORMAL LOW (ref 8.9–10.3)
Chloride: 102 mmol/L (ref 98–111)
Creatinine, Ser: 1.38 mg/dL — ABNORMAL HIGH (ref 0.44–1.00)
GFR calc Af Amer: 36 mL/min — ABNORMAL LOW (ref >60)
GFR calc non Af Amer: 31 mL/min — ABNORMAL LOW (ref >60)
Glucose, Bld: 112 mg/dL — ABNORMAL HIGH (ref 70–99)
Potassium: 4.2 mmol/L (ref 3.5–5.1)
Sodium: 140 mmol/L (ref 135–145)
Total Bilirubin: 1.5 mg/dL — ABNORMAL HIGH (ref 0.3–1.2)
Total Protein: 5.9 g/dL — ABNORMAL LOW (ref 6.5–8.1)

## 2019-08-01 MED ORDER — ENSURE ENLIVE PO LIQD
1.0000 | Freq: Two times a day (BID) | ORAL | Status: DC
  Administered 2019-08-02 – 2019-08-05 (×5): 237 mL via ORAL

## 2019-08-01 MED ORDER — DIGOXIN 0.0625 MG HALF TABLET
0.0625 mg | ORAL_TABLET | Freq: Every day | ORAL | Status: DC
  Administered 2019-08-02 – 2019-08-03 (×2): 0.0625 mg via ORAL
  Filled 2019-08-01 (×4): qty 1

## 2019-08-01 MED ORDER — LACTATED RINGERS IV SOLN: INTRAVENOUS | Status: AC

## 2019-08-01 NOTE — Progress Notes (Signed)
PROGRESS NOTE    Tanya STALZER  PPJ:[id number] DOB: Oct 21, 1916 DOA: 07/31/2019 PCP: Lorene Dy, MD    Brief Narrative:  86 year old female with PMH of ICM with EF 25 to 30%, mild dementia, reportedly tested positive for Covid on 07/28/2019 presented with diarrhea, dyspnea, poor appetite.  Placed on 2 L oxygen via nasal cannula and started on remdesivir, Decadron.  Also noted to have AKI for which she received gentle hydration given low EF.  Assessment & Plan:   Principal Problem:   Acute hypoxemic respiratory failure due to COVID-19 [facility]) Active Problems:   HFrEF (heart failure with reduced ejection fraction) ([place])   Advanced age   AKI (acute kidney injury) ([place])  Acute hypoxic respiratory failure Requiring oxygen via 2 L.  Chest x-ray consistent with multifocal pneumonia Secondary to COVID-19 pneumonia.  Procalcitonin negative.  No leukocytosis Continue remdesivir, dexamethasone Trend inflammatory markers Multivitamins  AKI on CKD 3A Baseline creatinine appears to be 1-1.3 Creatinine elevated to 1.6 on admission Improved with mild hydration Renally dose all medications.  Avoid nephrotoxic drugs. Continue gentle hydration with LR 50 cc/h for 10 hours  Heart failure reduced EF Last echocardiogram in 2012 with EF 20 to 25%.  No recent echo on chart Appears dry to euvolemic on exam No indication to repeat echocardiogram at this time Decrease digoxin dose to renally dose adjust  Hypothyroidism Continue Synthroid  CVA Continue statin  DVT prophylaxis: Heparin SQ Code Status: DNR  Family Communication: updated patient's daughter Grandville Silos via telephone Disposition Plan: Pending medical stability   Consultants:   None  Procedures:  None  Antimicrobials:   Remdesivir   Subjective: Confused.  Very hard of hearing.  Says breathing is not fine.  Has a poor appetite.  Objective: Vitals:   07/31/19 2003 07/31/19 2050 07/31/19 2144 08/01/19 0530  BP:  119/72 128/69  (!) 104/59  Pulse: 76 69  64  Resp: (!) 24 (!) 22    Temp:  99.3 F (37.4 C)  97.8 F (36.6 C)  TempSrc:  Oral  Oral  SpO2: 95% 96%  95%  Weight:   55.8 kg   Height:   5' 6.5" (1.689 m)    No intake or output data in the 24 hours ending 08/01/19 0831 Filed Weights   07/31/19 1706 07/31/19 2144  Weight: 55.8 kg 55.8 kg    Examination:  General exam: Appears calm and comfortable, tired appearing elderly female Respiratory system: Clear to auscultation. Respiratory effort normal. Cardiovascular system: S1 & S2 heard, RRR. No JVD, murmurs. No pedal edema. Gastrointestinal system: Abdomen is nondistended, soft and nontender.  Central nervous system: Alert, knows her date of birth and that she is in the hospital Extremities: Moves all extremities involuntarily Skin: No rashes, lesions or ulcers Psychiatry: Unable to assess    Data Reviewed: I have personally reviewed following labs and imaging studies  CBC: Recent Labs  Lab 07/31/19 1749 08/01/19 0243  WBC 8.1 6.8  NEUTROABS 3.3 2.7  HGB 12.7 11.0*  HCT 39.1 34.7*  MCV 95.4 96.4  PLT 112* 580*   Basic Metabolic Panel: Recent Labs  Lab 07/31/19 1749 08/01/19 0243  NA 140 140  K 4.4 4.2  CL 99 102  CO2 30 26  GLUCOSE 96 112*  BUN 40* 40*  CREATININE 1.60* 1.38*  CALCIUM 8.5* 8.1*   GFR: Estimated Creatinine Clearance: 19.1 mL/min (A) (by C-G formula based on SCr of 1.38 mg/dL (H)). Liver Function Tests: Recent Labs  Lab 07/31/19 1749 08/01/19  0243  AST 33 26  ALT 21 17  ALKPHOS 49 43  BILITOT 1.9* 1.5*  PROT 6.7 5.9*  ALBUMIN 4.0 3.5   No results for input(s): LIPASE, AMYLASE in the last 168 hours. No results for input(s): AMMONIA in the last 168 hours. Coagulation Profile: No results for input(s): INR, PROTIME in the last 168 hours. Cardiac Enzymes: No results for input(s): CKTOTAL, CKMB, CKMBINDEX, TROPONINI in the last 168 hours. BNP (last 3 results) No results for input(s):  PROBNP in the last 8760 hours. HbA1C: No results for input(s): HGBA1C in the last 72 hours. CBG: No results for input(s): GLUCAP in the last 168 hours. Lipid Profile: Recent Labs    07/31/19 1749  TRIG 153*   Thyroid Function Tests: No results for input(s): TSH, T4TOTAL, FREET4, T3FREE, THYROIDAB in the last 72 hours. Anemia Panel: Recent Labs    07/31/19 1749  FERRITIN 414*   Sepsis Labs: Recent Labs  Lab 07/31/19 1749 07/31/19 2121  PROCALCITON <0.10  --   LATICACIDVEN 1.4 1.1    Recent Results (from the past 240 hour(s))  Blood Culture (routine x 2)     Status: None (Preliminary result)   Collection Time: 07/31/19  5:48 PM   Specimen: BLOOD  Result Value Ref Range Status   Specimen Description   Final    BLOOD LEFT ANTECUBITAL Performed at [facility], [address] [place], [place] [address]    Special Requests   Final    BOTTLES DRAWN AEROBIC AND ANAEROBIC Blood Culture adequate volume Performed at [facility], [address], [place], [place] [address]    Culture   Final    NO GROWTH < 12 HOURS Performed at [facility], [address], [place], [place] [address]    Report Status PENDING  Incomplete  Blood Culture (routine x 2)     Status: None (Preliminary result)   Collection Time: 07/31/19  5:57 PM   Specimen: BLOOD  Result Value Ref Range Status   Specimen Description   Final    BLOOD RIGHT ANTECUBITAL Performed at [facility], [address] [place], [place] [address]    Special Requests   Final    BOTTLES DRAWN AEROBIC AND ANAEROBIC Blood Culture adequate volume Performed at [facility], [address], [place], [place] [address]    Culture   Final    NO GROWTH < 12 HOURS Performed at [facility], [address] [place], [place] [address]    Report Status PENDING  Incomplete         Radiology Studies: DG Chest Port 1 View  Result Date:  07/31/2019 CLINICAL DATA:  Diagnosed as COVID-19 positive on 07/28/2019, increased weakness, decreased appetite, dehydration for 3 days, history Alzheimer's, stroke, coronary artery disease post MI, CHF, ischemic cardiomyopathy, hypertension EXAM: PORTABLE CHEST 1 VIEW COMPARISON:  Portable exam 1722 hours compared to 11/10/2014 FINDINGS: Upper normal heart size. Mediastinal contours and pulmonary vascularity normal. Atherosclerotic calcification aorta. Patchy infiltrates identified LEFT upper lobe and RIGHT base consistent with multifocal pneumonia. No pleural effusion or pneumothorax. Bones demineralized. IMPRESSION: Patchy infiltrates LEFT upper lobe and RIGHT base consistent with multifocal pneumonia. Electronically Signed   By: Ulyses Southward M.D.   On: 07/31/2019 17:59        Scheduled Meds: . albuterol  2 puff Inhalation Q6H  . atorvastatin  10 mg Oral Daily  . cholecalciferol  2,000 Units Oral Daily  .  dexamethasone  6 mg Oral Q24H  . digoxin  0.125 mg Oral Daily  . diphenoxylate-atropine  1 tablet Oral Daily  . famotidine  20 mg Oral QHS  . heparin injection (subcutaneous)  5,000 Units Subcutaneous Q8H  . levothyroxine  75 mcg Oral Q0600  . multivitamin with minerals  1 tablet Oral Daily   Continuous Infusions: . remdesivir 100 mg in NS 100 mL       LOS: 1 day    Time spent: Spent more than 30 minutes in coordinating care for this patient including bedside patient care.   Liborio Nixon, MD [facility] If 7PM-7AM, please contact night-coverage 08/01/2019, 8:31 AM

## 2019-08-02 LAB — BRAIN NATRIURETIC PEPTIDE: B Natriuretic Peptide: 58.7 pg/mL (ref 0.0–100.0)

## 2019-08-02 LAB — CBC WITH DIFFERENTIAL/PLATELET
Abs Immature Granulocytes: 0.06 10*3/uL (ref 0.00–0.07)
Basophils Absolute: 0 10*3/uL (ref 0.0–0.1)
Basophils Relative: 0 %
Eosinophils Absolute: 0 10*3/uL (ref 0.0–0.5)
Eosinophils Relative: 0 %
HCT: 32.7 % — ABNORMAL LOW (ref 36.0–46.0)
Hemoglobin: 10.5 g/dL — ABNORMAL LOW (ref 12.0–15.0)
Immature Granulocytes: 1 %
Lymphocytes Relative: 50 %
Lymphs Abs: 3 10*3/uL (ref 0.7–4.0)
MCH: 31.2 pg (ref 26.0–34.0)
MCHC: 32.1 g/dL (ref 30.0–36.0)
MCV: 97 fL (ref 80.0–100.0)
Monocytes Absolute: 1 10*3/uL (ref 0.1–1.0)
Monocytes Relative: 17 %
Neutro Abs: 1.9 10*3/uL (ref 1.7–7.7)
Neutrophils Relative %: 32 %
Platelets: 135 10*3/uL — ABNORMAL LOW (ref 150–400)
RBC: 3.37 MIL/uL — ABNORMAL LOW (ref 3.87–5.11)
RDW: 14 % (ref 11.5–15.5)
WBC: 5.9 10*3/uL (ref 4.0–10.5)
nRBC: 0 % (ref 0.0–0.2)

## 2019-08-02 LAB — COMPREHENSIVE METABOLIC PANEL
ALT: 17 U/L (ref 0–44)
AST: 29 U/L (ref 15–41)
Albumin: 3.4 g/dL — ABNORMAL LOW (ref 3.5–5.0)
Alkaline Phosphatase: 41 U/L (ref 38–126)
Anion gap: 12 (ref 5–15)
BUN: 53 mg/dL — ABNORMAL HIGH (ref 8–23)
CO2: 26 mmol/L (ref 22–32)
Calcium: 8.3 mg/dL — ABNORMAL LOW (ref 8.9–10.3)
Chloride: 102 mmol/L (ref 98–111)
Creatinine, Ser: 1.45 mg/dL — ABNORMAL HIGH (ref 0.44–1.00)
GFR calc Af Amer: 34 mL/min — ABNORMAL LOW (ref >60)
GFR calc non Af Amer: 29 mL/min — ABNORMAL LOW (ref >60)
Glucose, Bld: 119 mg/dL — ABNORMAL HIGH (ref 70–99)
Potassium: 4.6 mmol/L (ref 3.5–5.1)
Sodium: 140 mmol/L (ref 135–145)
Total Bilirubin: 1.4 mg/dL — ABNORMAL HIGH (ref 0.3–1.2)
Total Protein: 5.6 g/dL — ABNORMAL LOW (ref 6.5–8.1)

## 2019-08-02 LAB — D-DIMER, QUANTITATIVE: D-Dimer, Quant: 0.66 ug/mL-FEU — ABNORMAL HIGH (ref 0.00–0.50)

## 2019-08-02 LAB — C-REACTIVE PROTEIN: CRP: 2 mg/dL — ABNORMAL HIGH (ref <1.0)

## 2019-08-02 NOTE — Progress Notes (Signed)
PROGRESS NOTE    [name]  WUJ:[id number] DOB: 1917/04/04 DOA: 07/31/2019 PCP: Lorene Dy, MD    Brief Narrative:  86 year old female with PMH of ICM with EF 25 to 30%, mild dementia, reportedly tested positive for Covid on 07/28/2019 presented with diarrhea, dyspnea, poor appetite.  Placed on 2 L oxygen via nasal cannula and started on remdesivir, Decadron.  Also noted to have AKI for which she received gentle hydration given low EF.  Assessment & Plan:   Principal Problem:   Acute hypoxemic respiratory failure due to COVID-19 [facility]) Active Problems:   HFrEF (heart failure with reduced ejection fraction) ([place])   Advanced age   AKI (acute kidney injury) ([place])  Acute hypoxic respiratory failure Requiring oxygen via 2 L.  Wean to room air. chest x-ray consistent with multifocal pneumonia Secondary to COVID-19 pneumonia.  Procalcitonin negative.  No leukocytosis Continue remdesivir (end date 1/24), dexamethasone Trend inflammatory markers Multivitamins  AKI on CKD 3A Baseline creatinine appears to be 1-1.3 Creatinine elevated to 1.6 on admission Improved with mild hydration Renally dose all medications.  Avoid nephrotoxic drugs.  Heart failure reduced EF Last echocardiogram in 2012 with EF 20 to 25%.  No recent echo on chart Appears dry to euvolemic on exam No indication to repeat echocardiogram at this time Decrease digoxin dose to renally dose adjust  Hypothyroidism Continue Synthroid  CVA Continue statin  DVT prophylaxis: Heparin SQ Code Status: DNR  Family Communication: updated patient's daughter Grandville Silos via telephone Disposition Plan: Pending PT eval, discharge back to facility when stable    Consultants:   None  Procedures:  None  Antimicrobials:   Remdesivir   Subjective: Awake, alert.  Had 25 to 50% of her meals.  Sitting on the chair.  Objective: Vitals:   08/02/19 0739 08/02/19 1100 08/02/19 1139 08/02/19 1255  BP:  (!) 108/55     Pulse:  60 60 61  Resp:  16    Temp:  97.7 F (36.5 C)    TempSrc:  Oral    SpO2: 94% 90%    Weight:      Height:        Intake/Output Summary (Last 24 hours) at 08/02/2019 1754 Last data filed at 08/02/2019 1127 Gross per 24 hour  Intake 1347.65 ml  Output 900 ml  Net 447.65 ml   Filed Weights   07/31/19 1706 07/31/19 2144  Weight: 55.8 kg 55.8 kg    Examination:  General exam: Appears calm and comfortable, tired appearing elderly female Respiratory system: Clear to auscultation. Respiratory effort normal. Cardiovascular system: S1 & S2 heard, RRR. No JVD, murmurs. No pedal edema. Gastrointestinal system: Abdomen is nondistended, soft and nontender.  Central nervous system: Alert, knows her date of birth and that she is in the hospital Extremities: Moves all extremities involuntarily Skin: No rashes, lesions or ulcers Psychiatry: Unable to assess    Data Reviewed: I have personally reviewed following labs and imaging studies  CBC: Recent Labs  Lab 07/31/19 1749 08/01/19 0243 08/02/19 0220  WBC 8.1 6.8 5.9  NEUTROABS 3.3 2.7 1.9  HGB 12.7 11.0* 10.5*  HCT 39.1 34.7* 32.7*  MCV 95.4 96.4 97.0  PLT 112* 108* 956*   Basic Metabolic Panel: Recent Labs  Lab 07/31/19 1749 08/01/19 0243 08/02/19 0220  NA 140 140 140  K 4.4 4.2 4.6  CL 99 102 102  CO2 30 26 26   GLUCOSE 96 112* 119*  BUN 40* 40* 53*  CREATININE 1.60* 1.38* 1.45*  CALCIUM  8.5* 8.1* 8.3*   GFR: Estimated Creatinine Clearance: 18.2 mL/min (A) (by C-G formula based on SCr of 1.45 mg/dL (H)). Liver Function Tests: Recent Labs  Lab 07/31/19 1749 08/01/19 0243 08/02/19 0220  AST 33 26 29  ALT 21 17 17   ALKPHOS 49 43 41  BILITOT 1.9* 1.5* 1.4*  PROT 6.7 5.9* 5.6*  ALBUMIN 4.0 3.5 3.4*   No results for input(s): LIPASE, AMYLASE in the last 168 hours. No results for input(s): AMMONIA in the last 168 hours. Coagulation Profile: No results for input(s): INR, PROTIME in the last 168  hours. Cardiac Enzymes: No results for input(s): CKTOTAL, CKMB, CKMBINDEX, TROPONINI in the last 168 hours. BNP (last 3 results) No results for input(s): PROBNP in the last 8760 hours. HbA1C: No results for input(s): HGBA1C in the last 72 hours. CBG: No results for input(s): GLUCAP in the last 168 hours. Lipid Profile: Recent Labs    07/31/19 1749  TRIG 153*   Thyroid Function Tests: No results for input(s): TSH, T4TOTAL, FREET4, T3FREE, THYROIDAB in the last 72 hours. Anemia Panel: Recent Labs    07/31/19 1749  FERRITIN 414*   Sepsis Labs: Recent Labs  Lab 07/31/19 1749 07/31/19 2121  PROCALCITON <0.10  --   LATICACIDVEN 1.4 1.1    Recent Results (from the past 240 hour(s))  Blood Culture (routine x 2)     Status: None (Preliminary result)   Collection Time: 07/31/19  5:48 PM   Specimen: BLOOD  Result Value Ref Range Status   Specimen Description   Final    BLOOD LEFT ANTECUBITAL Performed at [facility], [address], [place], [place]    Special Requests   Final    BOTTLES DRAWN AEROBIC AND ANAEROBIC Blood Culture adequate volume Performed at [facility], [address], [place], [place]    Culture   Final    NO GROWTH 2 DAYS Performed at [facility], [address], [place], [place]    Report Status PENDING  Incomplete  Blood Culture (routine x 2)     Status: None (Preliminary result)   Collection Time: 07/31/19  5:57 PM   Specimen: BLOOD  Result Value Ref Range Status   Specimen Description   Final    BLOOD RIGHT ANTECUBITAL Performed at [facility], [address] [place], [place]    Special Requests   Final    BOTTLES DRAWN AEROBIC AND ANAEROBIC Blood Culture adequate volume Performed at [facility], [address], [place], [place]    Culture   Final    NO GROWTH 2 DAYS Performed at [facility],  [address] [place], [place]    Report Status PENDING  Incomplete         Radiology Studies: No results found.      Scheduled Meds: . albuterol  2 puff Inhalation Q6H  . atorvastatin  10 mg Oral Daily  . cholecalciferol  2,000 Units Oral Daily  . dexamethasone  6 mg Oral Q24H  . digoxin  0.0625 mg Oral Daily  . diphenoxylate-atropine  1 tablet Oral Daily  . famotidine  20 mg Oral QHS  . feeding supplement (ENSURE ENLIVE)  1 Bottle Oral BID BM  . heparin injection (subcutaneous)  5,000 Units Subcutaneous Q8H  . levothyroxine  75 mcg Oral Q0600  . multivitamin with minerals  1 tablet Oral Daily   Continuous Infusions: .  remdesivir 100 mg in NS 100 mL 100 mg (08/02/19 1127)     LOS: 2 days    Time spent: Spent more than 30 minutes in coordinating care for this patient including bedside patient care.   Liborio Nixon, MD [facility] If 7PM-7AM, please contact night-coverage 08/02/2019, 5:54 PM

## 2019-08-03 DIAGNOSIS — U071 COVID-19: Principal | ICD-10-CM

## 2019-08-03 DIAGNOSIS — Z515 Encounter for palliative care: Secondary | ICD-10-CM

## 2019-08-03 DIAGNOSIS — J9601 Acute respiratory failure with hypoxia: Secondary | ICD-10-CM

## 2019-08-03 LAB — COMPREHENSIVE METABOLIC PANEL
ALT: 20 U/L (ref 0–44)
AST: 33 U/L (ref 15–41)
Albumin: 3.1 g/dL — ABNORMAL LOW (ref 3.5–5.0)
Alkaline Phosphatase: 37 U/L — ABNORMAL LOW (ref 38–126)
Anion gap: 9 (ref 5–15)
BUN: 59 mg/dL — ABNORMAL HIGH (ref 8–23)
CO2: 25 mmol/L (ref 22–32)
Calcium: 8.2 mg/dL — ABNORMAL LOW (ref 8.9–10.3)
Chloride: 104 mmol/L (ref 98–111)
Creatinine, Ser: 1.33 mg/dL — ABNORMAL HIGH (ref 0.44–1.00)
GFR calc Af Amer: 38 mL/min — ABNORMAL LOW (ref >60)
GFR calc non Af Amer: 33 mL/min — ABNORMAL LOW (ref >60)
Glucose, Bld: 119 mg/dL — ABNORMAL HIGH (ref 70–99)
Potassium: 4.8 mmol/L (ref 3.5–5.1)
Sodium: 138 mmol/L (ref 135–145)
Total Bilirubin: 1.1 mg/dL (ref 0.3–1.2)
Total Protein: 5.3 g/dL — ABNORMAL LOW (ref 6.5–8.1)

## 2019-08-03 LAB — CBC WITH DIFFERENTIAL/PLATELET
Abs Immature Granulocytes: 0.11 10*3/uL — ABNORMAL HIGH (ref 0.00–0.07)
Basophils Absolute: 0 10*3/uL (ref 0.0–0.1)
Basophils Relative: 0 %
Eosinophils Absolute: 0 10*3/uL (ref 0.0–0.5)
Eosinophils Relative: 0 %
HCT: 33.7 % — ABNORMAL LOW (ref 36.0–46.0)
Hemoglobin: 10.5 g/dL — ABNORMAL LOW (ref 12.0–15.0)
Immature Granulocytes: 1 %
Lymphocytes Relative: 33 %
Lymphs Abs: 3 10*3/uL (ref 0.7–4.0)
MCH: 30.5 pg (ref 26.0–34.0)
MCHC: 31.2 g/dL (ref 30.0–36.0)
MCV: 98 fL (ref 80.0–100.0)
Monocytes Absolute: 0.7 10*3/uL (ref 0.1–1.0)
Monocytes Relative: 7 %
Neutro Abs: 5.2 10*3/uL (ref 1.7–7.7)
Neutrophils Relative %: 59 %
Platelets: 124 10*3/uL — ABNORMAL LOW (ref 150–400)
RBC: 3.44 MIL/uL — ABNORMAL LOW (ref 3.87–5.11)
RDW: 14 % (ref 11.5–15.5)
WBC: 8.9 10*3/uL (ref 4.0–10.5)
nRBC: 0 % (ref 0.0–0.2)

## 2019-08-03 LAB — C-REACTIVE PROTEIN: CRP: 1.1 mg/dL — ABNORMAL HIGH (ref <1.0)

## 2019-08-03 LAB — D-DIMER, QUANTITATIVE: D-Dimer, Quant: 0.53 ug/mL-FEU — ABNORMAL HIGH (ref 0.00–0.50)

## 2019-08-03 NOTE — Consult Note (Signed)
Palliative Medicine  Name: [name] Date: 08/03/2019 MRN: [id number]  DOB: [birth year]  Patient Care Team: Lorene Dy, MD as PCP - General (Internal Medicine)    REASON FOR CONSULTATION: [name] is a 86 y.o. female with multiple medical problems including ischemic cardiomyopathy with EF of 25 to 30%, mild dementia, and advanced age, who was admitted to the hospital on 07/31/2019 with multifocal pneumonia from COVID-19.  Palliative care was consulted to help address goals.  SOCIAL HISTORY:     reports that she quit smoking about 51 years ago. She has never used smokeless tobacco. She reports that she does not drink alcohol or use drugs.   Patient is a resident of [facility].  She has a daughter who is involved in her care.  ADVANCE DIRECTIVES:  Not on file  CODE STATUS: DNR  PAST MEDICAL HISTORY: Past Medical History:  Diagnosis Date  . Advanced age   . Cardiac arrest [facility]) 2007   Occurred during planned ovarian surgery that resolved spontaneously after her stomach was deflated.   . CVA (cerebral infarction) Oct 2010   "light stroke". Plavix started.  . Grief    from husband's death  . Hyperlipemia   . Hypertension   . Hypothyroidism   . Ischemic cardiomyopathy June 2009   managed medically; EF is 20 to 25%  . Left bundle branch block   . Left heart failure (HCC)    EF is 20 to 25%; She is managed conservatively  . Stroke ([place])   . Subendocardial myocardial infarction [facility]) June 2009    PAST SURGICAL HISTORY:  Past Surgical History:  Procedure Laterality Date  . CATARACT EXTRACTION  09/18/2012   right eye  . DILATION AND CURETTAGE OF UTERUS    . OVARIAN CYST REMOVAL     2007-2008    HEMATOLOGY/ONCOLOGY HISTORY:  Oncology History   No history exists.    ALLERGIES:  is allergic to procaine hcl.  MEDICATIONS:  Current Facility-Administered Medications  Medication Dose Route Frequency Provider Last Rate Last Admin  .  acetaminophen (TYLENOL) tablet 650 mg  650 mg Oral Q6H PRN Etta Quill, DO      . albuterol (VENTOLIN HFA) 108 (90 Base) MCG/ACT inhaler 2 puff  2 puff Inhalation Q6H Etta Quill, DO   2 puff at 08/02/19 1819  . atorvastatin (LIPITOR) tablet 10 mg  10 mg Oral Daily Jennette Kettle M, DO   10 mg at 08/02/19 1126  . chlorpheniramine-HYDROcodone (TUSSIONEX) 10-8 MG/5ML suspension 5 mL  5 mL Oral Q12H PRN Etta Quill, DO      . cholecalciferol (VITAMIN D) tablet 2,000 Units  2,000 Units Oral Daily Etta Quill, DO   2,000 Units at 08/02/19 1126  . dexamethasone (DECADRON) tablet 6 mg  6 mg Oral Q24H Jennette Kettle M, DO   6 mg at 08/02/19 2147  . digoxin (LANOXIN) tablet 0.0625 mg  0.0625 mg Oral Daily Lucky Cowboy, MD   0.0625 mg at 08/02/19 1255  . diphenoxylate-atropine (LOMOTIL) 2.5-0.025 MG per tablet 1 tablet  1 tablet Oral Daily Jennette Kettle M, DO   1 tablet at 08/02/19 1126  . famotidine (PEPCID) tablet 20 mg  20 mg Oral QHS Jennette Kettle M, DO   20 mg at 08/02/19 2147  . feeding supplement (ENSURE ENLIVE) (ENSURE ENLIVE) liquid 237 mL  1 Bottle Oral BID BM Lucky Cowboy, MD   237 mL at 08/02/19 1819  . guaiFENesin-dextromethorphan (  ROBITUSSIN DM) 100-10 MG/5ML syrup 10 mL  10 mL Oral Q4H PRN Hillary Bow, DO      . heparin injection 5,000 Units  5,000 Units Subcutaneous Q8H Hillary Bow, DO   5,000 Units at 08/02/19 2147  . levothyroxine (SYNTHROID) tablet 75 mcg  75 mcg Oral Q0600 Hillary Bow, DO   75 mcg at 08/02/19 0534  . meclizine (ANTIVERT) tablet 12.5 mg  12.5 mg Oral BID PRN Hillary Bow, DO      . multivitamin with minerals tablet 1 tablet  1 tablet Oral Daily Lyda Perone M, DO   1 tablet at 08/02/19 1126  . ondansetron (ZOFRAN) tablet 4 mg  4 mg Oral Q6H PRN Hillary Bow, DO       Or  . ondansetron [facility]) injection 4 mg  4 mg Intravenous Q6H PRN Hillary Bow, DO   4 mg at 08/01/19 2101  . remdesivir 100 mg in sodium chloride 0.9 %  100 mL IVPB  100 mg Intravenous Daily Lyda Perone M, DO   Stopped at 08/02/19 1200    VITAL SIGNS: BP 105/64 (BP Location: Left Arm)   Pulse (!) 56   Temp 97.6 F (36.4 C) (Oral)   Resp 20   Ht 5' 6.5" (1.689 m)   Wt 123 lb 0.3 oz (55.8 kg)   SpO2 90%   BMI 19.56 kg/m  Filed Weights   07/31/19 1706 07/31/19 2144  Weight: 123 lb (55.8 kg) 123 lb 0.3 oz (55.8 kg)    Estimated body mass index is 19.56 kg/m as calculated from the following:   Height as of this encounter: 5' 6.5" (1.689 m).   Weight as of this encounter: 123 lb 0.3 oz (55.8 kg).  LABS: CBC:    Component Value Date/Time   WBC 8.9 08/03/2019 0604   HGB 10.5 (L) 08/03/2019 0604   HGB 12.3 10/03/2016 1134   HCT 33.7 (L) 08/03/2019 0604   HCT 37.5 10/03/2016 1134   PLT 124 (L) 08/03/2019 0604   PLT 149 (L) 10/03/2016 1134   MCV 98.0 08/03/2019 0604   MCV 90 10/03/2016 1134   NEUTROABS 5.2 08/03/2019 0604   LYMPHSABS 3.0 08/03/2019 0604   MONOABS 0.7 08/03/2019 0604   EOSABS 0.0 08/03/2019 0604   BASOSABS 0.0 08/03/2019 0604   Comprehensive Metabolic Panel:    Component Value Date/Time   NA 138 08/03/2019 0604   NA 143 10/03/2016 1134   K 4.8 08/03/2019 0604   CL 104 08/03/2019 0604   CO2 25 08/03/2019 0604   BUN 59 (H) 08/03/2019 0604   BUN 24 10/03/2016 1134   CREATININE 1.33 (H) 08/03/2019 0604   CREATININE 1.09 (H) 09/16/2015 1139   GLUCOSE 119 (H) 08/03/2019 0604   CALCIUM 8.2 (L) 08/03/2019 0604   AST 33 08/03/2019 0604   ALT 20 08/03/2019 0604   ALKPHOS 37 (L) 08/03/2019 0604   BILITOT 1.1 08/03/2019 0604   PROT 5.3 (L) 08/03/2019 0604   ALBUMIN 3.1 (L) 08/03/2019 0604    RADIOGRAPHIC STUDIES: DG Chest Port 1 View  Result Date: 07/31/2019 CLINICAL DATA:  Diagnosed as COVID-19 positive on 07/28/2019, increased weakness, decreased appetite, dehydration for 3 days, history Alzheimer's, stroke, coronary artery disease post MI, CHF, ischemic cardiomyopathy, hypertension EXAM: PORTABLE  CHEST 1 VIEW COMPARISON:  Portable exam 1722 hours compared to 11/10/2014 FINDINGS: Upper normal heart size. Mediastinal contours and pulmonary vascularity normal. Atherosclerotic calcification aorta. Patchy infiltrates identified LEFT upper lobe and RIGHT base  consistent with multifocal pneumonia. No pleural effusion or pneumothorax. Bones demineralized. IMPRESSION: Patchy infiltrates LEFT upper lobe and RIGHT base consistent with multifocal pneumonia. Electronically Signed   By: Ulyses Southward M.D.   On: 07/31/2019 17:59    PERFORMANCE STATUS (ECOG) : 2 - Symptomatic, <50% confined to bed  Review of Systems Deferred  Physical Exam Deferred  IMPRESSION: This was a virtual visit due to COVID-19 infection.  Discussed with nursing staff.  Patient is confused at baseline and would be unable to meaningfully engage in a conversation regarding goals.  However, patient is clinically doing better today.  O2 has been weaned and patient is currently on room air.  Patient is ambulatory around the room with use of a walker.  She is eating about 25% of meals.  I attempted to call patient's daughter twice without success.  We will try to reach her to clarify goals.  It sounds like patient is clinically improving and I would agree with continued current scope of treatment.  Could consider outpatient palliative care versus hospice depending upon her progress over the coming days.  PLAN: -Continue current scope of treatment -Agree with DNR -Consider outpatient palliative care versus hospice depending on patient's progress -We will continue to try to reach patient's daughter to discuss goals   Time Total: 30 minutes  Visit consisted of counseling and education dealing with the complex and emotionally intense issues of symptom management and palliative care in the setting of serious and potentially life-threatening illness.Greater than 50%  of this time was spent counseling and coordinating care related to  the above assessment and plan.  Signed by: Laurette Schimke, PhD, NP-C

## 2019-08-03 NOTE — Evaluation (Signed)
Physical Therapy Evaluation Patient Details Name: [name] MRN: [id number] DOB: [birth year] Today's Date: 08/03/2019   History of Present Illness  86 y.o. female with multiple medical problems including ischemic cardiomyopathy with EF of 25 to 30%, mild dementia, and advanced age, who was admitted to the hospital on 07/31/2019 with multifocal pneumonia from COVID-19.  Clinical Impression  Pt admitted with above diagnosis.  Pt quite cooperative with PT, very HOH worsened by negative pressure fan noise in room as well as PPE.  Pt amb to and from bathroom with min to min/guard assist. Pt will likely need HHPT and be able to return to her ALF. Difficult to get sat reading d/t low perfusion, eventually able to get reading with SpO2=98%, HR 66  Pt currently with functional limitations due to the deficits listed below (see PT Problem List). Pt will benefit from skilled PT to increase their independence and safety with mobility to allow discharge to the venue listed below.       Follow Up Recommendations Home health PT(at ALF)    Equipment Recommendations  None recommended by PT    Recommendations for Other Services       Precautions / Restrictions Precautions Precautions: Fall Precaution Comments: monitor sats Restrictions Weight Bearing Restrictions: No      Mobility  Bed Mobility               General bed mobility comments: pt up in chair on arrival  Transfers Overall transfer level: Needs assistance Equipment used: Rolling walker (2 wheeled) Transfers: Sit to/from Stand Sit to Stand: Min guard         General transfer comment: multi-modal cues for hand and LE position mostly  d/t pt HOH  Ambulation/Gait Ambulation/Gait assistance: Min guard;Min assist Gait Distance (Feet): 15 Feet(x2) Assistive device: Rolling walker (2 wheeled) Gait Pattern/deviations: Step-through pattern;Decreased stride length;Narrow base of support     General Gait Details: tactile  cues for posture, unsteady initially however no overt LOB  Stairs            Wheelchair Mobility    Modified Rankin (Stroke Patients Only)       Balance Overall balance assessment: Needs assistance           Standing balance-Leahy Scale: Poor Standing balance comment: reliant on UEs                             Pertinent Vitals/Pain Pain Assessment: No/denies pain    Home Living Family/patient expects to be discharged to:: Assisted living               Home Equipment: Walker - 2 wheels Additional Comments: per chart and pt she amb with RW at her ALF, [facility]    Prior Function                 Hand Dominance        Extremity/Trunk Assessment   Upper Extremity Assessment Upper Extremity Assessment: Generalized weakness    Lower Extremity Assessment Lower Extremity Assessment: Generalized weakness       Communication   Communication: HOH  Cognition Arousal/Alertness: Awake/alert Behavior During Therapy: WFL for tasks assessed/performed Overall Cognitive Status: History of cognitive impairments - at baseline  General Comments      Exercises     Assessment/Plan    PT Assessment Patient needs continued PT services  PT Problem List Decreased strength;Decreased mobility;Decreased activity tolerance;Decreased knowledge of use of DME;Decreased balance       PT Treatment Interventions DME instruction;Gait training;Functional mobility training;Therapeutic activities;Patient/family education;Therapeutic exercise    PT Goals (Current goals can be found in the Care Plan section)  Acute Rehab PT Goals PT Goal Formulation: With patient Time For Goal Achievement: 08/17/19 Potential to Achieve Goals: Good    Frequency Min 3X/week   Barriers to discharge        Co-evaluation               AM-PAC PT "6 Clicks" Mobility  Outcome Measure Help needed turning from  your back to your side while in a flat bed without using bedrails?: A Little Help needed moving from lying on your back to sitting on the side of a flat bed without using bedrails?: A Little Help needed moving to and from a bed to a chair (including a wheelchair)?: A Little Help needed standing up from a chair using your arms (e.g., wheelchair or bedside chair)?: A Little Help needed to walk in hospital room?: A Little Help needed climbing 3-5 steps with a railing? : A Little 6 Click Score: 18    End of Session Equipment Utilized During Treatment: Gait belt Activity Tolerance: Patient tolerated treatment well Patient left: in chair;with call bell/phone within reach;with chair alarm set Nurse Communication: Mobility status PT Visit Diagnosis: Difficulty in walking, not elsewhere classified (R26.2)    Time: 6063-0160 PT Time Calculation (min) (ACUTE ONLY): 26 min   Charges:   PT Evaluation $PT Eval Low Complexity: 1 Low PT Treatments $Gait Training: 8-22 mins        Delice Bison, PT   Acute Rehab Dept [facility]): [phone]   08/03/2019   [facility] 08/03/2019, 4:32 PM

## 2019-08-03 NOTE — Progress Notes (Signed)
PROGRESS NOTE    [name]  JJH:[id number] DOB: 07-Jun-1917 DOA: 07/31/2019 PCP: Burton Apley, MD    Brief Narrative:  86 year old female with PMH of ICM with EF 25 to 30%, mild dementia, reportedly tested positive for Covid on 07/28/2019 presented with diarrhea, dyspnea, poor appetite.  Placed on 2 L oxygen via nasal cannula and started on remdesivir, Decadron.  Also noted to have AKI for which she received gentle hydration given low EF.  Assessment & Plan:   Principal Problem:   Acute hypoxemic respiratory failure due to COVID-19 [facility]) Active Problems:   HFrEF (heart failure with reduced ejection fraction) (HCC)   Advanced age   AKI (acute kidney injury) (HCC)  Acute hypoxic respiratory failure 2/2 COVID-19 PNA Currently saturating around 90% on room air Afebrile, no leukocytosis Procalcitonin negative Inflammatory markers down trended Chest x-ray consistent with multifocal pneumonia Continue remdesivir (end date 1/24), dexamethasone Trend inflammatory markers Multivitamins  AKI on CKD 3A Baseline creatinine appears to be 1-1.3 Creatinine elevated to 1.6 on admission Improved with mild hydration Renally dose all medications.  Avoid nephrotoxic drugs  Anemia of chronic kidney disease Baseline hemoglobin around 11 Daily CBC  Chronic systolic and diastolic HF Last echocardiogram in 2012 with EF 20 to 25%.  No recent echo on chart Appears dry to euvolemic on exam No indication to repeat echocardiogram at this time Monitor closely  Hypothyroidism Continue Synthroid  CVA Continue statin    DVT prophylaxis: Heparin SQ Code Status: DNR  Family Communication: None at bedside Disposition Plan: Pending PT eval, possible discharge back to facility, [facility] consulted   Consultants:   None  Procedures:  None  Antimicrobials:   Remdesivir   Subjective: Awake, alert, denies any new complaints.  Objective: Vitals:   08/02/19 2015 08/03/19 0000  08/03/19 0200 08/03/19 0440  BP: (!) 109/53   105/64  Pulse: 64   (!) 56  Resp:    20  Temp: 98 F (36.7 C)   97.6 F (36.4 C)  TempSrc: Oral   Oral  SpO2: 94% 90% 91% 90%  Weight:      Height:        Intake/Output Summary (Last 24 hours) at 08/03/2019 1141 Last data filed at 08/03/2019 0700 Gross per 24 hour  Intake 240 ml  Output 1400 ml  Net -1160 ml   Filed Weights   07/31/19 1706 07/31/19 2144  Weight: 55.8 kg 55.8 kg    Examination:  General: NAD, elderly female, knows her name, date of birth, location  Cardiovascular: S1, S2 present  Respiratory: CTAB  Abdomen: Soft, nontender, nondistended, bowel sounds present  Musculoskeletal: No bilateral pedal edema noted  Skin: Normal  Psychiatry:  Unable to assess    Data Reviewed: I have personally reviewed following labs and imaging studies  CBC: Recent Labs  Lab 07/31/19 1749 08/01/19 0243 08/02/19 0220 08/03/19 0604  WBC 8.1 6.8 5.9 8.9  NEUTROABS 3.3 2.7 1.9 5.2  HGB 12.7 11.0* 10.5* 10.5*  HCT 39.1 34.7* 32.7* 33.7*  MCV 95.4 96.4 97.0 98.0  PLT 112* 108* 135* 124*   Basic Metabolic Panel: Recent Labs  Lab 07/31/19 1749 08/01/19 0243 08/02/19 0220 08/03/19 0604  NA 140 140 140 138  K 4.4 4.2 4.6 4.8  CL 99 102 102 104  CO2 30 26 26 25   GLUCOSE 96 112* 119* 119*  BUN 40* 40* 53* 59*  CREATININE 1.60* 1.38* 1.45* 1.33*  CALCIUM 8.5* 8.1* 8.3* 8.2*   GFR: Estimated Creatinine Clearance:  19.8 mL/min (A) (by C-G formula based on SCr of 1.33 mg/dL (H)). Liver Function Tests: Recent Labs  Lab 07/31/19 1749 08/01/19 0243 08/02/19 0220 08/03/19 0604  AST 33 26 29 33  ALT 21 17 17 20   ALKPHOS 49 43 41 37*  BILITOT 1.9* 1.5* 1.4* 1.1  PROT 6.7 5.9* 5.6* 5.3*  ALBUMIN 4.0 3.5 3.4* 3.1*   No results for input(s): LIPASE, AMYLASE in the last 168 hours. No results for input(s): AMMONIA in the last 168 hours. Coagulation Profile: No results for input(s): INR, PROTIME in the last 168  hours. Cardiac Enzymes: No results for input(s): CKTOTAL, CKMB, CKMBINDEX, TROPONINI in the last 168 hours. BNP (last 3 results) No results for input(s): PROBNP in the last 8760 hours. HbA1C: No results for input(s): HGBA1C in the last 72 hours. CBG: No results for input(s): GLUCAP in the last 168 hours. Lipid Profile: Recent Labs    07/31/19 1749  TRIG 153*   Thyroid Function Tests: No results for input(s): TSH, T4TOTAL, FREET4, T3FREE, THYROIDAB in the last 72 hours. Anemia Panel: Recent Labs    07/31/19 1749  FERRITIN 414*   Sepsis Labs: Recent Labs  Lab 07/31/19 1749 07/31/19 2121  PROCALCITON <0.10  --   LATICACIDVEN 1.4 1.1    Recent Results (from the past 240 hour(s))  Blood Culture (routine x 2)     Status: None (Preliminary result)   Collection Time: 07/31/19  5:48 PM   Specimen: BLOOD  Result Value Ref Range Status   Specimen Description   Final    BLOOD LEFT ANTECUBITAL Performed at [facility], [address] [place], [place]    Special Requests   Final    BOTTLES DRAWN AEROBIC AND ANAEROBIC Blood Culture adequate volume Performed at [facility], [address] [place], [place]    Culture   Final    NO GROWTH 3 DAYS Performed at [facility], [address], [place], [place]    Report Status PENDING  Incomplete  Blood Culture (routine x 2)     Status: None (Preliminary result)   Collection Time: 07/31/19  5:57 PM   Specimen: BLOOD  Result Value Ref Range Status   Specimen Description   Final    BLOOD RIGHT ANTECUBITAL Performed at [facility], [address], [place], [place]    Special Requests   Final    BOTTLES DRAWN AEROBIC AND ANAEROBIC Blood Culture adequate volume Performed at [facility], [address] [place], [place]    Culture   Final    NO GROWTH 3 DAYS Performed at [facility],  [address], [place], [place]    Report Status PENDING  Incomplete         Radiology Studies: No results found.      Scheduled Meds: . albuterol  2 puff Inhalation Q6H  . atorvastatin  10 mg Oral Daily  . cholecalciferol  2,000 Units Oral Daily  . dexamethasone  6 mg Oral Q24H  . digoxin  0.0625 mg Oral Daily  . diphenoxylate-atropine  1 tablet Oral Daily  . famotidine  20 mg Oral QHS  . feeding supplement (ENSURE ENLIVE)  1 Bottle Oral BID BM  . heparin injection (subcutaneous)  5,000 Units Subcutaneous Q8H  . levothyroxine  75 mcg Oral Q0600  . multivitamin with minerals  1 tablet Oral Daily   Continuous Infusions: .  remdesivir 100 mg in NS 100 mL Stopped (08/02/19 1200)     LOS: 3 days     Alma Friendly, MD [facility] If 7PM-7AM, please contact night-coverage 08/03/2019, 11:41 AM

## 2019-08-04 LAB — CBC WITH DIFFERENTIAL/PLATELET
Abs Immature Granulocytes: 0.09 10*3/uL — ABNORMAL HIGH (ref 0.00–0.07)
Basophils Absolute: 0 10*3/uL (ref 0.0–0.1)
Basophils Relative: 0 %
Eosinophils Absolute: 0 10*3/uL (ref 0.0–0.5)
Eosinophils Relative: 0 %
HCT: 33.6 % — ABNORMAL LOW (ref 36.0–46.0)
Hemoglobin: 10.9 g/dL — ABNORMAL LOW (ref 12.0–15.0)
Immature Granulocytes: 1 %
Lymphocytes Relative: 42 %
Lymphs Abs: 2.8 10*3/uL (ref 0.7–4.0)
MCH: 30.5 pg (ref 26.0–34.0)
MCHC: 32.4 g/dL (ref 30.0–36.0)
MCV: 94.1 fL (ref 80.0–100.0)
Monocytes Absolute: 0.6 10*3/uL (ref 0.1–1.0)
Monocytes Relative: 9 %
Neutro Abs: 3.2 10*3/uL (ref 1.7–7.7)
Neutrophils Relative %: 48 %
Platelets: 142 10*3/uL — ABNORMAL LOW (ref 150–400)
RBC: 3.57 MIL/uL — ABNORMAL LOW (ref 3.87–5.11)
RDW: 13.9 % (ref 11.5–15.5)
WBC: 6.7 10*3/uL (ref 4.0–10.5)
nRBC: 0 % (ref 0.0–0.2)

## 2019-08-04 LAB — COMPREHENSIVE METABOLIC PANEL
ALT: 22 U/L (ref 0–44)
AST: 33 U/L (ref 15–41)
Albumin: 3.4 g/dL — ABNORMAL LOW (ref 3.5–5.0)
Alkaline Phosphatase: 39 U/L (ref 38–126)
Anion gap: 10 (ref 5–15)
BUN: 52 mg/dL — ABNORMAL HIGH (ref 8–23)
CO2: 26 mmol/L (ref 22–32)
Calcium: 8.5 mg/dL — ABNORMAL LOW (ref 8.9–10.3)
Chloride: 105 mmol/L (ref 98–111)
Creatinine, Ser: 1.29 mg/dL — ABNORMAL HIGH (ref 0.44–1.00)
GFR calc Af Amer: 39 mL/min — ABNORMAL LOW (ref >60)
GFR calc non Af Amer: 34 mL/min — ABNORMAL LOW (ref >60)
Glucose, Bld: 124 mg/dL — ABNORMAL HIGH (ref 70–99)
Potassium: 4.5 mmol/L (ref 3.5–5.1)
Sodium: 141 mmol/L (ref 135–145)
Total Bilirubin: 1.2 mg/dL (ref 0.3–1.2)
Total Protein: 5.5 g/dL — ABNORMAL LOW (ref 6.5–8.1)

## 2019-08-04 LAB — C-REACTIVE PROTEIN: CRP: 1 mg/dL — ABNORMAL HIGH (ref <1.0)

## 2019-08-04 LAB — D-DIMER, QUANTITATIVE: D-Dimer, Quant: 0.54 ug/mL-FEU — ABNORMAL HIGH (ref 0.00–0.50)

## 2019-08-04 MED ORDER — SODIUM CHLORIDE 0.9 % IV SOLN
INTRAVENOUS | Status: DC | PRN
  Administered 2019-08-04: 08:00:00 250 mL via INTRAVENOUS

## 2019-08-04 MED ORDER — LORAZEPAM 2 MG/ML IJ SOLN: 0.5000 mg | Freq: Four times a day (QID) | INTRAMUSCULAR | Status: DC | PRN

## 2019-08-04 MED ORDER — ALBUTEROL SULFATE HFA 108 (90 BASE) MCG/ACT IN AERS
2.0000 | INHALATION_SPRAY | Freq: Four times a day (QID) | RESPIRATORY_TRACT | Status: DC | PRN
  Administered 2019-08-05: 2 via RESPIRATORY_TRACT

## 2019-08-04 MED ORDER — SENNOSIDES-DOCUSATE SODIUM 8.6-50 MG PO TABS
1.0000 | ORAL_TABLET | Freq: Two times a day (BID) | ORAL | Status: DC
  Administered 2019-08-04 – 2019-08-05 (×3): 1 via ORAL
  Filled 2019-08-04 (×3): qty 1

## 2019-08-04 MED ORDER — POLYETHYLENE GLYCOL 3350 17 G PO PACK
17.0000 g | PACK | Freq: Every day | ORAL | Status: DC
  Administered 2019-08-04 – 2019-08-05 (×2): 17 g via ORAL
  Filled 2019-08-04 (×2): qty 1

## 2019-08-04 NOTE — Progress Notes (Signed)
PROGRESS NOTE    [name]  TWS:[id number] DOB: [birth year] DOA: 07/31/2019 PCP: Burton Apley, MD    Brief Narrative:  86 year old female with PMH of ICM with EF 25 to 30%, mild dementia, reportedly tested positive for Covid on 07/28/2019 presented with diarrhea, dyspnea, poor appetite.  Placed on 2 L oxygen via nasal cannula and started on remdesivir, Decadron.  Also noted to have AKI for which she received gentle hydration given low EF.  Assessment & Plan:   Principal Problem:   Acute hypoxemic respiratory failure due to COVID-19 [facility]) Active Problems:   HFrEF (heart failure with reduced ejection fraction) (HCC)   Advanced age   AKI (acute kidney injury) (HCC)   Palliative care encounter  Acute hypoxic respiratory failure 2/2 COVID-19 PNA Currently saturating around 96% on room air Afebrile, no leukocytosis Procalcitonin negative Inflammatory markers down trended Chest x-ray consistent with multifocal pneumonia Continue remdesivir (end date 1/24), dexamethasone Trend inflammatory markers Multivitamins  AKI on CKD 3A Baseline creatinine appears to be 1-1.3 Creatinine elevated to 1.6 on admission Improved with mild hydration Renally dose all medications.  Avoid nephrotoxic drugs  Anemia of chronic kidney disease Baseline hemoglobin around 11 Daily CBC  Chronic systolic and diastolic HF Last echocardiogram in 2012 with EF 20 to 25%.  No recent echo on chart Appears dry to euvolemic on exam No indication to repeat echocardiogram at this time Monitor closely  Hypothyroidism Continue Synthroid  CVA Continue statin    DVT prophylaxis: Heparin SQ Code Status: DNR  Family Communication: None at bedside Disposition Plan: Discharge back to facility, [facility] consulted   Consultants:   None  Procedures:  None  Antimicrobials:   Remdesivir   Subjective: Patient seen and examined at bedside.  Alert, awake, appears comfortable.  Pleasantly  confused  Objective: Vitals:   08/04/19 0452 08/04/19 0844 08/04/19 1245 08/04/19 1409  BP: (!) 146/64  117/74 130/74  Pulse: 60 (!) 58 (!) 59 (!) 55  Resp: (!) 24  20 14   Temp: 97.8 F (36.6 C)  (!) 97.5 F (36.4 C) 97.8 F (36.6 C)  TempSrc: Axillary  Oral Oral  SpO2: 93%  96% 96%  Weight:      Height:        Intake/Output Summary (Last 24 hours) at 08/04/2019 1511 Last data filed at 08/04/2019 1502 Gross per 24 hour  Intake 149 ml  Output 450 ml  Net -301 ml   Filed Weights   07/31/19 1706 07/31/19 2144  Weight: 55.8 kg 55.8 kg    Examination:  General: NAD, pleasantly confused  Cardiovascular: S1, S2 present  Respiratory: CTAB  Abdomen: Soft, nontender, nondistended, bowel sounds present  Musculoskeletal: No bilateral pedal edema noted  Skin: Normal  Psychiatry: Unable to assess    Data Reviewed: I have personally reviewed following labs and imaging studies  CBC: Recent Labs  Lab 07/31/19 1749 08/01/19 0243 08/02/19 0220 08/03/19 0604 08/04/19 0319  WBC 8.1 6.8 5.9 8.9 6.7  NEUTROABS 3.3 2.7 1.9 5.2 3.2  HGB 12.7 11.0* 10.5* 10.5* 10.9*  HCT 39.1 34.7* 32.7* 33.7* 33.6*  MCV 95.4 96.4 97.0 98.0 94.1  PLT 112* 108* 135* 124* 142*   Basic Metabolic Panel: Recent Labs  Lab 07/31/19 1749 08/01/19 0243 08/02/19 0220 08/03/19 0604 08/04/19 0319  NA 140 140 140 138 141  K 4.4 4.2 4.6 4.8 4.5  CL 99 102 102 104 105  CO2 30 26 26 25 26   GLUCOSE 96 112* 119* 119* 124*  BUN 40* 40* 53* 59* 52*  CREATININE 1.60* 1.38* 1.45* 1.33* 1.29*  CALCIUM 8.5* 8.1* 8.3* 8.2* 8.5*   GFR: Estimated Creatinine Clearance: 20.4 mL/min (A) (by C-G formula based on SCr of 1.29 mg/dL (H)). Liver Function Tests: Recent Labs  Lab 07/31/19 1749 08/01/19 0243 08/02/19 0220 08/03/19 0604 08/04/19 0319  AST 33 26 29 33 33  ALT 21 17 17 20 22   ALKPHOS 49 43 41 37* 39  BILITOT 1.9* 1.5* 1.4* 1.1 1.2  PROT 6.7 5.9* 5.6* 5.3* 5.5*  ALBUMIN 4.0 3.5 3.4* 3.1*  3.4*   No results for input(s): LIPASE, AMYLASE in the last 168 hours. No results for input(s): AMMONIA in the last 168 hours. Coagulation Profile: No results for input(s): INR, PROTIME in the last 168 hours. Cardiac Enzymes: No results for input(s): CKTOTAL, CKMB, CKMBINDEX, TROPONINI in the last 168 hours. BNP (last 3 results) No results for input(s): PROBNP in the last 8760 hours. HbA1C: No results for input(s): HGBA1C in the last 72 hours. CBG: No results for input(s): GLUCAP in the last 168 hours. Lipid Profile: No results for input(s): CHOL, HDL, LDLCALC, TRIG, CHOLHDL, LDLDIRECT in the last 72 hours. Thyroid Function Tests: No results for input(s): TSH, T4TOTAL, FREET4, T3FREE, THYROIDAB in the last 72 hours. Anemia Panel: No results for input(s): VITAMINB12, FOLATE, FERRITIN, TIBC, IRON, RETICCTPCT in the last 72 hours. Sepsis Labs: Recent Labs  Lab 07/31/19 1749 07/31/19 2121  PROCALCITON <0.10  --   LATICACIDVEN 1.4 1.1    Recent Results (from the past 240 hour(s))  Blood Culture (routine x 2)     Status: None (Preliminary result)   Collection Time: 07/31/19  5:48 PM   Specimen: BLOOD  Result Value Ref Range Status   Specimen Description   Final    BLOOD LEFT ANTECUBITAL Performed at [facility], [place] [address] [place], [place] [address]    Special Requests   Final    BOTTLES DRAWN AEROBIC AND ANAEROBIC Blood Culture adequate volume Performed at [facility] [address] [place], [place] [address]    Culture   Final    NO GROWTH 4 DAYS Performed at [facility], [address], [place], [place] [address]    Report Status PENDING  Incomplete  Blood Culture (routine x 2)     Status: None (Preliminary result)   Collection Time: 07/31/19  5:57 PM   Specimen: BLOOD  Result Value Ref Range Status   Specimen Description   Final    BLOOD RIGHT ANTECUBITAL Performed at [facility]  [address], [place], [place] [address]    Special Requests   Final    BOTTLES DRAWN AEROBIC AND ANAEROBIC Blood Culture adequate volume Performed at [facility] [address] [place], [place] [address]    Culture   Final    NO GROWTH 4 DAYS Performed at [facility], [place] [address] [place],  [address]    Report Status PENDING  Incomplete         Radiology Studies: No results found.      Scheduled Meds: . atorvastatin  10 mg Oral Daily  . cholecalciferol  2,000 Units Oral Daily  . dexamethasone  6 mg Oral Q24H  . digoxin  0.0625 mg Oral Daily  . diphenoxylate-atropine  1 tablet Oral Daily  . famotidine  20 mg Oral QHS  . feeding supplement (ENSURE ENLIVE)  1 Bottle Oral BID BM  .  heparin injection (subcutaneous)  5,000 Units Subcutaneous Q8H  . levothyroxine  75 mcg Oral Q0600  . multivitamin with minerals  1 tablet Oral Daily  . polyethylene glycol  17 g Oral Daily  . senna-docusate  1 tablet Oral BID   Continuous Infusions: . sodium chloride 250 mL (08/04/19 0811)     LOS: 4 days     Briant Cedar, MD [facility] If 7PM-7AM, please contact night-coverage 08/04/2019, 3:11 PM

## 2019-08-05 LAB — CBC WITH DIFFERENTIAL/PLATELET
Abs Immature Granulocytes: 0.11 10*3/uL — ABNORMAL HIGH (ref 0.00–0.07)
Basophils Absolute: 0 10*3/uL (ref 0.0–0.1)
Basophils Relative: 0 %
Eosinophils Absolute: 0 10*3/uL (ref 0.0–0.5)
Eosinophils Relative: 0 %
HCT: 35.2 % — ABNORMAL LOW (ref 36.0–46.0)
Hemoglobin: 11.5 g/dL — ABNORMAL LOW (ref 12.0–15.0)
Immature Granulocytes: 2 %
Lymphocytes Relative: 46 %
Lymphs Abs: 3.4 10*3/uL (ref 0.7–4.0)
MCH: 30.6 pg (ref 26.0–34.0)
MCHC: 32.7 g/dL (ref 30.0–36.0)
MCV: 93.6 fL (ref 80.0–100.0)
Monocytes Absolute: 0.7 10*3/uL (ref 0.1–1.0)
Monocytes Relative: 9 %
Neutro Abs: 3.1 10*3/uL (ref 1.7–7.7)
Neutrophils Relative %: 43 %
Platelets: 153 10*3/uL (ref 150–400)
RBC: 3.76 MIL/uL — ABNORMAL LOW (ref 3.87–5.11)
RDW: 13.6 % (ref 11.5–15.5)
WBC: 7.3 10*3/uL (ref 4.0–10.5)
nRBC: 0 % (ref 0.0–0.2)

## 2019-08-05 LAB — COMPREHENSIVE METABOLIC PANEL
ALT: 22 U/L (ref 0–44)
AST: 28 U/L (ref 15–41)
Albumin: 3.4 g/dL — ABNORMAL LOW (ref 3.5–5.0)
Alkaline Phosphatase: 40 U/L (ref 38–126)
Anion gap: 8 (ref 5–15)
BUN: 46 mg/dL — ABNORMAL HIGH (ref 8–23)
CO2: 26 mmol/L (ref 22–32)
Calcium: 8.6 mg/dL — ABNORMAL LOW (ref 8.9–10.3)
Chloride: 106 mmol/L (ref 98–111)
Creatinine, Ser: 1.21 mg/dL — ABNORMAL HIGH (ref 0.44–1.00)
GFR calc Af Amer: 43 mL/min — ABNORMAL LOW (ref >60)
GFR calc non Af Amer: 37 mL/min — ABNORMAL LOW (ref >60)
Glucose, Bld: 125 mg/dL — ABNORMAL HIGH (ref 70–99)
Potassium: 4.2 mmol/L (ref 3.5–5.1)
Sodium: 140 mmol/L (ref 135–145)
Total Bilirubin: 1.6 mg/dL — ABNORMAL HIGH (ref 0.3–1.2)
Total Protein: 5.7 g/dL — ABNORMAL LOW (ref 6.5–8.1)

## 2019-08-05 LAB — CULTURE, BLOOD (ROUTINE X 2)
Culture: NO GROWTH
Culture: NO GROWTH
Special Requests: ADEQUATE
Special Requests: ADEQUATE

## 2019-08-05 LAB — D-DIMER, QUANTITATIVE: D-Dimer, Quant: 0.57 ug/mL-FEU — ABNORMAL HIGH (ref 0.00–0.50)

## 2019-08-05 LAB — C-REACTIVE PROTEIN: CRP: 0.8 mg/dL (ref <1.0)

## 2019-08-05 MED ORDER — FAMOTIDINE 20 MG PO TABS: 20.0000 mg | ORAL_TABLET | Freq: Every day | ORAL | 0 refills | Status: AC

## 2019-08-05 MED ORDER — DEXAMETHASONE 6 MG PO TABS: 6.0000 mg | ORAL_TABLET | ORAL | 0 refills | Status: DC

## 2019-08-05 NOTE — TOC Progression Note (Signed)
Transition of Care [facility]) - Progression Note    Patient Details  Name: [name] MRN: [id number] Date of Birth: [birth year]  Transition of [facility]) CM/SW Contact  Geni Bers, RN Phone Number: 08/05/2019, 1:28 PM  Clinical Narrative:    Several calls made to [facility] and left VM with no return call at present time.         Expected Discharge Plan and Services                                                 Social Determinants of Health (SDOH) Interventions    Readmission Risk Interventions No flowsheet data found.

## 2019-08-05 NOTE — Plan of Care (Signed)
  Problem: Education: Goal: Knowledge of General Education information will improve Description: Including pain rating scale, medication(s)/side effects and non-pharmacologic comfort measures Outcome: Progressing   Problem: Activity: Goal: Risk for activity intolerance will decrease Outcome: Progressing   

## 2019-08-05 NOTE — TOC Progression Note (Signed)
Transition of Care [facility]) - Progression Note    Patient Details  Name: [name] MRN: [id number] Date of Birth: 12/01/1916  Transition of Care [facility]) CM/SW Contact  Geni Bers, RN Phone Number: 08/05/2019, 4:51 PM  Clinical Narrative:     Transportation was called. Pt will discharge to [facility] with [facility]. Daughter is aware and selected [facility] for [facility].        Expected Discharge Plan and Services           Expected Discharge Date: 08/05/19                                     Social Determinants of Health (SDOH) Interventions    Readmission Risk Interventions No flowsheet data found.

## 2019-08-05 NOTE — Progress Notes (Signed)
Called report to [place], Charity fundraiser at [facility]. SRP, RN

## 2019-08-05 NOTE — Progress Notes (Signed)
PROGRESS NOTE    [name]  QIO:[id number] DOB: 01-08-1917 DOA: 07/31/2019 PCP: Lorene Dy, MD    Brief Narrative:  86 year old female with PMH of ICM with EF 25 to 30%, mild dementia, reportedly tested positive for Covid on 07/28/2019 presented with diarrhea, dyspnea, poor appetite.  Placed on 2 L oxygen via nasal cannula and started on remdesivir, Decadron.  Also noted to have AKI for which she received gentle hydration given low EF.  Assessment & Plan:   Principal Problem:   Acute hypoxemic respiratory failure due to COVID-19 [facility]) Active Problems:   HFrEF (heart failure with reduced ejection fraction) ([place])   Advanced age   AKI (acute kidney injury) ([place])   Palliative care encounter  Acute hypoxic respiratory failure 2/2 COVID-19 PNA Currently saturating around 96% on room air Afebrile, no leukocytosis Procalcitonin negative Inflammatory markers down trended Chest x-ray consistent with multifocal pneumonia Completed remdesivir (end date 1/24), continue dexamethasone Trend inflammatory markers Multivitamins  AKI on CKD 3A Baseline creatinine appears to be 1-1.3 Creatinine elevated to 1.6 on admission Improved with mild hydration Renally dose all medications.  Avoid nephrotoxic drugs  Anemia of chronic kidney disease Baseline hemoglobin around 11 Daily CBC  Chronic systolic and diastolic HF Last echocardiogram in 2012 with EF 20 to 25%.  No recent echo on chart Appears dry to euvolemic on exam No indication to repeat echocardiogram at this time Monitor closely  Hypothyroidism Continue Synthroid  CVA Continue statin    DVT prophylaxis: Heparin SQ Code Status: DNR  Family Communication: Discussed with Daughter on 08/04/19 Disposition Plan: Discharge back to ALF, TOC consulted   Consultants:   None  Procedures:  None  Antimicrobials:   Remdesivir   Subjective: Patient seen and examined at bedside.  Looks comfortable.  No shortness of  breath noted.  Poor oral intake noted  Objective: Vitals:   08/04/19 1409 08/04/19 2056 08/05/19 0442 08/05/19 1334  BP: 130/74 139/78 119/68 105/61  Pulse: (!) 55 (!) 57 (!) 57 68  Resp: 14 18 15 18   Temp: 97.8 F (36.6 C) 97.8 F (36.6 C) (!) 97.4 F (36.3 C) 98.2 F (36.8 C)  TempSrc: Oral Oral Oral Oral  SpO2: 96% 95% 92% 95%  Weight:      Height:        Intake/Output Summary (Last 24 hours) at 08/05/2019 1447 Last data filed at 08/05/2019 0847 Gross per 24 hour  Intake 99 ml  Output 600 ml  Net -501 ml   Filed Weights   07/31/19 1706 07/31/19 2144  Weight: 55.8 kg 55.8 kg    Examination:  General: NAD, confused  Cardiovascular: S1, S2 present  Respiratory: CTAB  Abdomen: Soft, nontender, nondistended, bowel sounds present  Musculoskeletal: No bilateral pedal edema noted  Skin: Normal  Psychiatry:  Unable to assess    Data Reviewed: I have personally reviewed following labs and imaging studies  CBC: Recent Labs  Lab 08/01/19 0243 08/02/19 0220 08/03/19 0604 08/04/19 0319 08/05/19 0340  WBC 6.8 5.9 8.9 6.7 7.3  NEUTROABS 2.7 1.9 5.2 3.2 3.1  HGB 11.0* 10.5* 10.5* 10.9* 11.5*  HCT 34.7* 32.7* 33.7* 33.6* 35.2*  MCV 96.4 97.0 98.0 94.1 93.6  PLT 108* 135* 124* 142* 324   Basic Metabolic Panel: Recent Labs  Lab 08/01/19 0243 08/02/19 0220 08/03/19 0604 08/04/19 0319 08/05/19 0340  NA 140 140 138 141 140  K 4.2 4.6 4.8 4.5 4.2  CL 102 102 104 105 106  CO2 26 26 25  26 26  GLUCOSE 112* 119* 119* 124* 125*  BUN 40* 53* 59* 52* 46*  CREATININE 1.38* 1.45* 1.33* 1.29* 1.21*  CALCIUM 8.1* 8.3* 8.2* 8.5* 8.6*   GFR: Estimated Creatinine Clearance: 21.8 mL/min (A) (by C-G formula based on SCr of 1.21 mg/dL (H)). Liver Function Tests: Recent Labs  Lab 08/01/19 0243 08/02/19 0220 08/03/19 0604 08/04/19 0319 08/05/19 0340  AST 26 29 33 33 28  ALT 17 17 20 22 22   ALKPHOS 43 41 37* 39 40  BILITOT 1.5* 1.4* 1.1 1.2 1.6*  PROT 5.9* 5.6*  5.3* 5.5* 5.7*  ALBUMIN 3.5 3.4* 3.1* 3.4* 3.4*   No results for input(s): LIPASE, AMYLASE in the last 168 hours. No results for input(s): AMMONIA in the last 168 hours. Coagulation Profile: No results for input(s): INR, PROTIME in the last 168 hours. Cardiac Enzymes: No results for input(s): CKTOTAL, CKMB, CKMBINDEX, TROPONINI in the last 168 hours. BNP (last 3 results) No results for input(s): PROBNP in the last 8760 hours. HbA1C: No results for input(s): HGBA1C in the last 72 hours. CBG: No results for input(s): GLUCAP in the last 168 hours. Lipid Profile: No results for input(s): CHOL, HDL, LDLCALC, TRIG, CHOLHDL, LDLDIRECT in the last 72 hours. Thyroid Function Tests: No results for input(s): TSH, T4TOTAL, FREET4, T3FREE, THYROIDAB in the last 72 hours. Anemia Panel: No results for input(s): VITAMINB12, FOLATE, FERRITIN, TIBC, IRON, RETICCTPCT in the last 72 hours. Sepsis Labs: Recent Labs  Lab 07/31/19 1749 07/31/19 2121  PROCALCITON <0.10  --   LATICACIDVEN 1.4 1.1    Recent Results (from the past 240 hour(s))  Blood Culture (routine x 2)     Status: None   Collection Time: 07/31/19  5:48 PM   Specimen: BLOOD  Result Value Ref Range Status   Specimen Description   Final    BLOOD LEFT ANTECUBITAL Performed at [facility], [address] [place], [place]    Special Requests   Final    BOTTLES DRAWN AEROBIC AND ANAEROBIC Blood Culture adequate volume Performed at [facility], [address] [place], [place]    Culture   Final    NO GROWTH 5 DAYS Performed at [facility], [address] [place], [place]    Report Status 08/05/2019 FINAL  Final  Blood Culture (routine x 2)     Status: None   Collection Time: 07/31/19  5:57 PM   Specimen: BLOOD  Result Value Ref Range Status   Specimen Description   Final    BLOOD RIGHT ANTECUBITAL Performed at [facility], [address], [place], [place]    Special Requests   Final    BOTTLES DRAWN AEROBIC AND ANAEROBIC Blood Culture adequate volume Performed at [facility], [address] [place], [place]    Culture   Final    NO GROWTH 5 DAYS Performed at [facility], [address] [place], [place]    Report Status 08/05/2019 FINAL  Final         Radiology Studies: No results found.      Scheduled Meds: . atorvastatin  10 mg Oral Daily  . cholecalciferol  2,000 Units Oral Daily  . dexamethasone  6 mg Oral Q24H  . digoxin  0.0625 mg Oral Daily  . diphenoxylate-atropine  1 tablet Oral Daily  . famotidine  20 mg Oral QHS  . feeding supplement (ENSURE  ENLIVE)  1 Bottle Oral BID BM  . heparin injection (subcutaneous)  5,000 Units Subcutaneous Q8H  . levothyroxine  75 mcg Oral Q0600  . multivitamin with minerals  1 tablet Oral Daily  . polyethylene glycol  17 g Oral Daily  . senna-docusate  1 tablet Oral BID   Continuous Infusions: . sodium chloride 250 mL (08/04/19 0811)     LOS: 5 days     Briant Cedar, MD [facility] If 7PM-7AM, please contact night-coverage 08/05/2019, 2:47 PM

## 2019-08-05 NOTE — TOC Progression Note (Signed)
Transition of Care [facility]) - Progression Note    Patient Details  Name: KINSLEY HOLDERMAN MRN: [id number] Date of Birth: Jun 14, 1917  Transition of Care [facility]) CM/SW Contact  Geni Bers, RN Phone Number: 08/05/2019, 2:44 PM  Clinical Narrative:    Spoke with [facility] CNA and RN who states pt was in IDL and would do things when prompted with CNA assist. Pt's RN here at [facility] states pt is not really doing anything to help. This information was shared with [facility]'s RN [place] who the states pt's daughter would need to be called for extra CNA assistance.  Pt's daughter was called. Explained this information to Ms Janee Morn what Dorthy Cooler, RN had stated and asked her if she would like pt to go to a SNF. Daughter states that the Memory Care Unit there at [facility] is like a SNF. Daughter was asked to call [facility]. Waiting on a call.        Expected Discharge Plan and Services                                                 Social Determinants of Health (SDOH) Interventions    Readmission Risk Interventions No flowsheet data found.

## 2019-08-05 NOTE — Progress Notes (Signed)
Pt up to side of bed to void and returned to bed with assistance. Pt alert. SRP, RN

## 2019-08-05 NOTE — Progress Notes (Signed)
Patient discharge at this time via EMS in no acute episode. VS stable.  

## 2019-08-05 NOTE — Care Management Important Message (Signed)
Important Message  Patient Details IM Letter given to Ezekiel Ina RN Case Manager to present to the Patient Name: [name] MRN: [id number] Date of Birth: 1917-04-01   Medicare Important Message Given:  Yes     Caren Macadam 08/05/2019, 10:56 AM

## 2019-08-05 NOTE — Discharge Summary (Signed)
Discharge Summary  [name] GMW:[id number] DOB: 06/18/1917  PCP: Burton Apley, MD  Admit date: 07/31/2019 Discharge date: 08/05/2019  Time spent: 40 mins   Recommendations for Outpatient Follow-up:  1. PCP follow-up with repeat labs in 1 week  Discharge Diagnoses:  Active Hospital Problems   Diagnosis Date Noted  . Acute hypoxemic respiratory failure due to COVID-19 ([facility]) 07/31/2019  . Palliative care encounter   . Advanced age 84/20/2021  . AKI (acute kidney injury) ([facility]) 07/31/2019  . HFrEF (heart failure with reduced ejection fraction) ([facility]) 04/20/2017    Resolved Hospital Problems  No resolved problems to display.    Discharge Condition: Stable  Diet recommendation: As tolerated  Vitals:   08/05/19 0442 08/05/19 1334  BP: 119/68 105/61  Pulse: (!) 57 68  Resp: 15 18  Temp: (!) 97.4 F (36.3 C) 98.2 F (36.8 C)  SpO2: 92% 95%    History of present illness:  86 year old female with PMH of ICM with EF 25 to 30%, mild dementia, reportedly tested positive for Covid on 07/28/2019 presented with diarrhea, dyspnea, poor appetite.  Placed on 2 L oxygen via nasal cannula and started on remdesivir, Decadron.  Also noted to have AKI for which she received gentle hydration given low EF.    Today, patient seen and examined at bedside.  Looks comfortable.  No shortness of breath noted.   Stable to DC to ALF   Hospital Course:  Principal Problem:   Acute hypoxemic respiratory failure due to COVID-19 [facility]) Active Problems:   HFrEF (heart failure with reduced ejection fraction) (HCC)   Advanced age   AKI (acute kidney injury) [facility])   Palliative care encounter   Acute hypoxic respiratory failure 2/2 COVID-19 PNA Currently saturating around 96% on room air Afebrile, no leukocytosis Procalcitonin negative Inflammatory markers down trended Chest x-ray consistent with multifocal pneumonia Completed remdesivir (end date 1/24), continue dexamethasone for a total  of 10 days, today is day 5 (last dose 12/07/19) Multivitamins Follow-up with PCP  AKI on CKD 3A Baseline creatinine appears to be 1-1.3 Creatinine elevated to 1.6 on admission Improved with mild hydration Follow-up with PCP  Anemia of chronic kidney disease Baseline hemoglobin around 11  Chronic systolic and diastolic HF Last echocardiogram in 2012 with EF 20 to 25%.  No recent echo on chart Appears dry to euvolemic on exam No indication to repeat echocardiogram at this time Follow-up with cardiology  Hypothyroidism Continue Synthroid  CVA Continue statin           Malnutrition Type:      Malnutrition Characteristics:      Nutrition Interventions:      Estimated body mass index is 19.56 kg/m as calculated from the following:   Height as of this encounter: 5' 6.5" (1.689 m).   Weight as of this encounter: 55.8 kg.    Procedures:  None  Consultations:  None  Discharge Exam: BP 105/61 (BP Location: Right Arm)   Pulse 68   Temp 98.2 F (36.8 C) (Oral)   Resp 18   Ht 5' 6.5" (1.689 m)   Wt 55.8 kg   SpO2 95%   BMI 19.56 kg/m   General: NAD, pleasantly confused Cardiovascular: S1, S2 present Respiratory: CTAB  Discharge Instructions You were cared for by a hospitalist during your hospital stay. If you have any questions about your discharge medications or the care you received while you were in the hospital after you are discharged, you can call the unit and asked  to speak with the hospitalist on call if the hospitalist that took care of you is not available. Once you are discharged, your primary care physician will handle any further medical issues. Please note that NO REFILLS for any discharge medications will be authorized once you are discharged, as it is imperative that you return to your primary care physician (or establish a relationship with a primary care physician if you do not have one) for your aftercare needs so that they can  reassess your need for medications and monitor your lab values.  Discharge Instructions    Diet - low sodium heart healthy   Complete by: As directed    Increase activity slowly   Complete by: As directed      Allergies as of 08/05/2019      Reactions   Procaine Hcl Hypertension   PASSED OUT      Medication List    TAKE these medications   acetaminophen 500 MG tablet Commonly known as: TYLENOL Take 500 mg by mouth every 6 (six) hours as needed for moderate pain.   acetaminophen 500 MG tablet Commonly known as: TYLENOL Take 1 tablet (500 mg total) by mouth every 6 (six) hours as needed for mild pain, moderate pain, fever or headache.   atorvastatin 10 MG tablet Commonly known as: LIPITOR Take 1 tablet (10 mg total) by mouth daily.   Centrum Silver tablet Take 1 tablet by mouth daily.   cholecalciferol 25 MCG (1000 UNIT) tablet Commonly known as: VITAMIN D Take 1 tablet (1,000 Units total) by mouth daily. What changed: how much to take   dexamethasone 6 MG tablet Commonly known as: DECADRON Take 1 tablet (6 mg total) by mouth daily for 5 days.   digoxin 0.125 MG tablet Commonly known as: Digitek TAKE 1 TABLET (0.125 MG TOTAL) BY MOUTH DAILY. What changed:   how much to take  how to take this  when to take this  additional instructions   diphenoxylate-atropine 2.5-0.025 MG tablet Commonly known as: LOMOTIL Take 1 tablet by mouth daily.   famotidine 20 MG tablet Commonly known as: PEPCID Take 1 tablet (20 mg total) by mouth at bedtime.   furosemide 20 MG tablet Commonly known as: LASIX Take 1 tablet (20 mg total) by mouth daily. What changed: how much to take   levothyroxine 75 MCG tablet Commonly known as: SYNTHROID Take 75 mcg by mouth daily before breakfast.   meclizine 25 MG tablet Commonly known as: ANTIVERT Take 12.5 mg by mouth 2 (two) times daily as needed for dizziness.   nitroGLYCERIN 0.2 mg/hr patch Commonly known as: NITRODUR - Dosed  in mg/24 hr PLACE ONE PATCH ONTO SKIN ONCE DAILY AT BEDTIME What changed:   how much to take  how to take this  when to take this  additional instructions   nitroGLYCERIN 0.4 MG SL tablet Commonly known as: NITROSTAT Place 1 tablet (0.4 mg total) under the tongue every 5 (five) minutes as needed for chest pain. What changed: Another medication with the same name was changed. Make sure you understand how and when to take each.   potassium chloride 10 MEQ tablet Commonly known as: KLOR-CON TAKE 1 TABLET (10 MEQ TOTAL) BY MOUTH DAILY. What changed:   how much to take  how to take this  when to take this  additional instructions      Allergies  Allergen Reactions  . Procaine Hcl Hypertension    PASSED OUT   Follow-up Information  Burton Apley, MD. Schedule an appointment as soon as possible for a visit in 1 week(s).   Specialty: Internal Medicine Contact information: [address] [place] [address] [phone]            The results of significant diagnostics from this hospitalization (including imaging, microbiology, ancillary and laboratory) are listed below for reference.    Significant Diagnostic Studies: DG Chest Port 1 View  Result Date: 07/31/2019 CLINICAL DATA:  Diagnosed as COVID-19 positive on 07/28/2019, increased weakness, decreased appetite, dehydration for 3 days, history Alzheimer's, stroke, coronary artery disease post MI, CHF, ischemic cardiomyopathy, hypertension EXAM: PORTABLE CHEST 1 VIEW COMPARISON:  Portable exam 1722 hours compared to 11/10/2014 FINDINGS: Upper normal heart size. Mediastinal contours and pulmonary vascularity normal. Atherosclerotic calcification aorta. Patchy infiltrates identified LEFT upper lobe and RIGHT base consistent with multifocal pneumonia. No pleural effusion or pneumothorax. Bones demineralized. IMPRESSION: Patchy infiltrates LEFT upper lobe and RIGHT base consistent with multifocal pneumonia.  Electronically Signed   By: Ulyses Southward M.D.   On: 07/31/2019 17:59    Microbiology: Recent Results (from the past 240 hour(s))  Blood Culture (routine x 2)     Status: None   Collection Time: 07/31/19  5:48 PM   Specimen: BLOOD  Result Value Ref Range Status   Specimen Description   Final    BLOOD LEFT ANTECUBITAL Performed at [facility], [address], [place], [place] [address]    Special Requests   Final    BOTTLES DRAWN AEROBIC AND ANAEROBIC Blood Culture adequate volume Performed at [facility], [address], [place], [place] [address]    Culture   Final    NO GROWTH 5 DAYS Performed at [facility], [address], [place], [place] [address]    Report Status 08/05/2019 FINAL  Final  Blood Culture (routine x 2)     Status: None   Collection Time: 07/31/19  5:57 PM   Specimen: BLOOD  Result Value Ref Range Status   Specimen Description   Final    BLOOD RIGHT ANTECUBITAL Performed at [facility], [address], [place], [place] [address]    Special Requests   Final    BOTTLES DRAWN AEROBIC AND ANAEROBIC Blood Culture adequate volume Performed at [facility], [address], [place], [place] [address]    Culture   Final    NO GROWTH 5 DAYS Performed at [facility], [address], [place], [place] [address]    Report Status 08/05/2019 FINAL  Final     Labs: Basic Metabolic Panel: Recent Labs  Lab 08/01/19 0243 08/02/19 0220 08/03/19 0604 08/04/19 0319 08/05/19 0340  NA 140 140 138 141 140  K 4.2 4.6 4.8 4.5 4.2  CL 102 102 104 105 106  CO2 26 26 25 26 26   GLUCOSE 112* 119* 119* 124* 125*  BUN 40* 53* 59* 52* 46*  CREATININE 1.38* 1.45* 1.33* 1.29* 1.21*  CALCIUM 8.1* 8.3* 8.2* 8.5* 8.6*   Liver Function Tests: Recent Labs  Lab 08/01/19 0243 08/02/19 0220 08/03/19 0604 08/04/19 0319 08/05/19 0340  AST 26 29 33 33 28  ALT 17 17 20 22 22   ALKPHOS 43 41  37* 39 40  BILITOT 1.5* 1.4* 1.1 1.2 1.6*  PROT 5.9* 5.6* 5.3* 5.5* 5.7*  ALBUMIN 3.5 3.4* 3.1* 3.4* 3.4*   No results for input(s): LIPASE, AMYLASE in the last 168 hours. No results for input(s): AMMONIA  in the last 168 hours. CBC: Recent Labs  Lab 08/01/19 0243 08/02/19 0220 08/03/19 0604 08/04/19 0319 08/05/19 0340  WBC 6.8 5.9 8.9 6.7 7.3  NEUTROABS 2.7 1.9 5.2 3.2 3.1  HGB 11.0* 10.5* 10.5* 10.9* 11.5*  HCT 34.7* 32.7* 33.7* 33.6* 35.2*  MCV 96.4 97.0 98.0 94.1 93.6  PLT 108* 135* 124* 142* 153   Cardiac Enzymes: No results for input(s): CKTOTAL, CKMB, CKMBINDEX, TROPONINI in the last 168 hours. BNP: BNP (last 3 results) Recent Labs    08/02/19 0220  BNP 58.7    ProBNP (last 3 results) No results for input(s): PROBNP in the last 8760 hours.  CBG: No results for input(s): GLUCAP in the last 168 hours.     Signed:  Briant Cedar, MD [facility] 08/05/2019, 4:31 PM

## 2019-08-06 ENCOUNTER — Emergency Department (HOSPITAL_COMMUNITY): Payer: Medicare Other

## 2019-08-06 ENCOUNTER — Other Ambulatory Visit: Payer: Self-pay

## 2019-08-06 ENCOUNTER — Encounter (HOSPITAL_COMMUNITY): Payer: Self-pay

## 2019-08-06 ENCOUNTER — Emergency Department (HOSPITAL_COMMUNITY)
Admission: EM | Admit: 2019-08-06 | Discharge: 2019-08-06 | Disposition: A | Discharge status: Home / Self Care | Payer: Medicare Other | Source: Home / Self Care | Attending: Emergency Medicine | Admitting: Emergency Medicine

## 2019-08-06 DIAGNOSIS — E039 Hypothyroidism, unspecified: Secondary | ICD-10-CM | POA: Insufficient documentation

## 2019-08-06 DIAGNOSIS — W1830XA Fall on same level, unspecified, initial encounter: Secondary | ICD-10-CM | POA: Insufficient documentation

## 2019-08-06 DIAGNOSIS — Z8673 Personal history of transient ischemic attack (TIA), and cerebral infarction without residual deficits: Secondary | ICD-10-CM | POA: Insufficient documentation

## 2019-08-06 DIAGNOSIS — N179 Acute kidney failure, unspecified: Secondary | ICD-10-CM | POA: Diagnosis not present

## 2019-08-06 DIAGNOSIS — Y92122 Bedroom in nursing home as the place of occurrence of the external cause: Secondary | ICD-10-CM | POA: Insufficient documentation

## 2019-08-06 DIAGNOSIS — U071 COVID-19: Secondary | ICD-10-CM | POA: Insufficient documentation

## 2019-08-06 DIAGNOSIS — F0390 Unspecified dementia without behavioral disturbance: Secondary | ICD-10-CM | POA: Insufficient documentation

## 2019-08-06 DIAGNOSIS — Y999 Unspecified external cause status: Secondary | ICD-10-CM | POA: Insufficient documentation

## 2019-08-06 DIAGNOSIS — Z87891 Personal history of nicotine dependence: Secondary | ICD-10-CM | POA: Insufficient documentation

## 2019-08-06 DIAGNOSIS — W19XXXA Unspecified fall, initial encounter: Secondary | ICD-10-CM

## 2019-08-06 DIAGNOSIS — I10 Essential (primary) hypertension: Secondary | ICD-10-CM | POA: Insufficient documentation

## 2019-08-06 DIAGNOSIS — R410 Disorientation, unspecified: Secondary | ICD-10-CM | POA: Insufficient documentation

## 2019-08-06 DIAGNOSIS — Z79899 Other long term (current) drug therapy: Secondary | ICD-10-CM | POA: Insufficient documentation

## 2019-08-06 DIAGNOSIS — Y939 Activity, unspecified: Secondary | ICD-10-CM | POA: Insufficient documentation

## 2019-08-06 LAB — COMPREHENSIVE METABOLIC PANEL
ALT: 25 U/L (ref 0–44)
AST: 32 U/L (ref 15–41)
Albumin: 3.6 g/dL (ref 3.5–5.0)
Alkaline Phosphatase: 45 U/L (ref 38–126)
Anion gap: 10 (ref 5–15)
BUN: 42 mg/dL — ABNORMAL HIGH (ref 8–23)
CO2: 26 mmol/L (ref 22–32)
Calcium: 9.2 mg/dL (ref 8.9–10.3)
Chloride: 109 mmol/L (ref 98–111)
Creatinine, Ser: 1.45 mg/dL — ABNORMAL HIGH (ref 0.44–1.00)
GFR calc Af Amer: 34 mL/min — ABNORMAL LOW (ref >60)
GFR calc non Af Amer: 29 mL/min — ABNORMAL LOW (ref >60)
Glucose, Bld: 96 mg/dL (ref 70–99)
Potassium: 4.1 mmol/L (ref 3.5–5.1)
Sodium: 145 mmol/L (ref 135–145)
Total Bilirubin: 1.9 mg/dL — ABNORMAL HIGH (ref 0.3–1.2)
Total Protein: 6.2 g/dL — ABNORMAL LOW (ref 6.5–8.1)

## 2019-08-06 LAB — CBC WITH DIFFERENTIAL/PLATELET
Abs Immature Granulocytes: 0 10*3/uL (ref 0.00–0.07)
Basophils Absolute: 0 10*3/uL (ref 0.0–0.1)
Basophils Relative: 0 %
Eosinophils Absolute: 0 10*3/uL (ref 0.0–0.5)
Eosinophils Relative: 0 %
HCT: 41.8 % (ref 36.0–46.0)
Hemoglobin: 13.5 g/dL (ref 12.0–15.0)
Lymphocytes Relative: 36 %
Lymphs Abs: 3.5 10*3/uL (ref 0.7–4.0)
MCH: 30.3 pg (ref 26.0–34.0)
MCHC: 32.3 g/dL (ref 30.0–36.0)
MCV: 93.7 fL (ref 80.0–100.0)
Monocytes Absolute: 0.7 10*3/uL (ref 0.1–1.0)
Monocytes Relative: 7 %
Neutro Abs: 5.5 10*3/uL (ref 1.7–7.7)
Neutrophils Relative %: 57 %
Platelets: 178 10*3/uL (ref 150–400)
RBC: 4.46 MIL/uL (ref 3.87–5.11)
RDW: 13.4 % (ref 11.5–15.5)
WBC: 9.7 10*3/uL (ref 4.0–10.5)
nRBC: 0 % (ref 0.0–0.2)
nRBC: 0 /100 WBC

## 2019-08-06 LAB — URINALYSIS, ROUTINE W REFLEX MICROSCOPIC
Bilirubin Urine: NEGATIVE
Glucose, UA: NEGATIVE mg/dL
Hgb urine dipstick: NEGATIVE
Ketones, ur: NEGATIVE mg/dL
Leukocytes,Ua: NEGATIVE
Nitrite: NEGATIVE
Protein, ur: NEGATIVE mg/dL
Specific Gravity, Urine: 1.008 (ref 1.005–1.030)
pH: 7 (ref 5.0–8.0)

## 2019-08-06 NOTE — ED Notes (Signed)
[facility] called @ 1616-per Barbette Or, RN called by Marylene Land

## 2019-08-06 NOTE — ED Notes (Signed)
Patient verbalizes understanding of discharge instructions. Opportunity for questioning and answers were provided. Armband removed by staff. Patient discharged from ED. Signature Pad unavailable

## 2019-08-06 NOTE — ED Provider Notes (Signed)
[facility] EMERGENCY DEPARTMENT Provider Note   CSN: [id number] Arrival date & time: 08/06/19  1248     LEVEL 5 CAVEAT - DEMENTIA  History Chief Complaint  Patient presents with  . Fall  . Altered Mental Status    [name] is a 86 y.o. female.  HPI 86 year old female presents with altered mental status.  History is mostly from the facility.  The patient has dementia but today in the morning they found her sitting on the floor.  They were not sure if she fell.  She seemed to be talking out of her head and seeing people that were dead.  This is not typical for her.  She was just discharged yesterday after being diagnosed with Covid-19 infection.    Past Medical History:  Diagnosis Date  . Advanced age   . Cardiac arrest [facility]) 2007   Occurred during planned ovarian surgery that resolved spontaneously after her stomach was deflated.   . CVA (cerebral infarction) Oct 2010   "light stroke". Plavix started.  . Grief    from husband's death  . Hyperlipemia   . Hypertension   . Hypothyroidism   . Ischemic cardiomyopathy June 2009   managed medically; EF is 20 to 25%  . Left bundle branch block   . Left heart failure (HCC)    EF is 20 to 25%; She is managed conservatively  . Stroke (HCC)   . Subendocardial myocardial infarction [facility]) June 2009    Patient Active Problem List   Diagnosis Date Noted  . Palliative care encounter   . Acute hypoxemic respiratory failure due to COVID-19 (HCC) 07/31/2019  . Advanced age 80/20/2021  . AKI (acute kidney injury) (HCC) 07/31/2019  . Memory deficit 06/06/2017  . Abnormal EKG 06/06/2017  . Recurrent falls 06/05/2017  . Diarrhea 04/20/2017  . Colitis 04/20/2017  . HFrEF (heart failure with reduced ejection fraction) (HCC) 04/20/2017  . Hypokalemia 04/20/2017  . Elevated bilirubin 04/20/2017  . CVA (cerebral infarction) 08/04/2011  . Ischemic cardiomyopathy   . Hypothyroidism   . Hyperlipemia      Past Surgical History:  Procedure Laterality Date  . CATARACT EXTRACTION  09/18/2012   right eye  . DILATION AND CURETTAGE OF UTERUS    . OVARIAN CYST REMOVAL     2007-2008     OB History   No obstetric history on file.     Family History  Problem Relation Age of Onset  . Aneurysm Father        ruptured  . Heart attack Mother   . Heart defect Mother        enlarged heart    Social History   Tobacco Use  . Smoking status: Former Smoker    Quit date: 07/11/1968    Years since quitting: 51.1  . Smokeless tobacco: Never Used  Substance Use Topics  . Alcohol use: No  . Drug use: No    Home Medications Prior to Admission medications   Medication Sig Start Date End Date Taking? Authorizing Provider  acetaminophen (TYLENOL) 500 MG tablet Take 1 tablet (500 mg total) by mouth every 6 (six) hours as needed for mild pain, moderate pain, fever or headache. 06/02/17   Hedges, Tinnie Gens, PA-C  acetaminophen (TYLENOL) 500 MG tablet Take 500 mg by mouth every 6 (six) hours as needed for moderate pain.    [provider]  atorvastatin (LIPITOR) 10 MG tablet Take 1 tablet (10 mg total) by mouth daily. 06/14/17  Medina-Vargas, Monina C, NP  cholecalciferol (VITAMIN D) 1000 units tablet Take 1 tablet (1,000 Units total) by mouth daily. Patient taking differently: Take 2,000 Units by mouth daily.  06/14/17   Medina-Vargas, Monina C, NP  dexamethasone (DECADRON) 6 MG tablet Take 1 tablet (6 mg total) by mouth daily for 5 days. 08/05/19 08/10/19  Briant Cedar, MD  digoxin (DIGITEK) 0.125 MG tablet TAKE 1 TABLET (0.125 MG TOTAL) BY MOUTH DAILY. Patient taking differently: Take 0.125 mg by mouth daily.  06/14/17   Medina-Vargas, Monina C, NP  diphenoxylate-atropine (LOMOTIL) 2.5-0.025 MG tablet Take 1 tablet by mouth daily.  07/23/19   [provider]  famotidine (PEPCID) 20 MG tablet Take 1 tablet (20 mg total) by mouth at bedtime. 08/05/19 09/04/19  Briant Cedar,  MD  furosemide (LASIX) 20 MG tablet Take 1 tablet (20 mg total) by mouth daily. Patient taking differently: Take 40 mg by mouth daily.  06/14/17   Medina-Vargas, Monina C, NP  levothyroxine (SYNTHROID, LEVOTHROID) 75 MCG tablet Take 75 mcg by mouth daily before breakfast. 08/24/18   [provider]  meclizine (ANTIVERT) 25 MG tablet Take 12.5 mg by mouth 2 (two) times daily as needed for dizziness.  04/02/19   [provider]  Multiple Vitamins-Minerals (CENTRUM SILVER) tablet Take 1 tablet by mouth daily. 06/02/17   Hedges, Tinnie Gens, PA-C  nitroGLYCERIN (NITRODUR - DOSED IN MG/24 HR) 0.2 mg/hr patch PLACE ONE PATCH ONTO SKIN ONCE DAILY AT BEDTIME Patient taking differently: Place 0.2 mg onto the skin at bedtime.  06/14/17   Medina-Vargas, Monina C, NP  nitroGLYCERIN (NITROSTAT) 0.4 MG SL tablet Place 1 tablet (0.4 mg total) under the tongue every 5 (five) minutes as needed for chest pain. 06/14/17   Medina-Vargas, Monina C, NP  potassium chloride (K-DUR) 10 MEQ tablet TAKE 1 TABLET (10 MEQ TOTAL) BY MOUTH DAILY. Patient taking differently: Take 10 mEq by mouth daily.  06/14/17   Medina-Vargas, Monina C, NP    Allergies    Novocain [procaine] and Procaine hcl  Review of Systems   Review of Systems  Unable to perform ROS: Dementia    Physical Exam Updated Vital Signs BP 122/71   Pulse 63   Temp 98.2 F (36.8 C) (Oral)   Resp 20   SpO2 95%   Physical Exam Vitals and nursing note reviewed.  Constitutional:      Appearance: She is well-developed.  HENT:     Head: Normocephalic and atraumatic.     Right Ear: External ear normal.     Left Ear: External ear normal.     Nose: Nose normal.  Eyes:     General:        Right eye: No discharge.        Left eye: No discharge.  Cardiovascular:     Rate and Rhythm: Normal rate and regular rhythm.     Heart sounds: Normal heart sounds.  Pulmonary:     Effort: Pulmonary effort is normal.     Breath sounds: Normal breath  sounds.  Abdominal:     Palpations: Abdomen is soft.     Tenderness: There is no abdominal tenderness.  Musculoskeletal:     Comments: No hip pain, tenderness or decreased ROM No tenderness to major joints/extremities or back.   Skin:    General: Skin is warm and dry.  Neurological:     Mental Status: She is alert.     Comments: Awake, alert, oriented to self only. In no  distress. Moves all 4 extremities equally.   Psychiatric:        Mood and Affect: Mood is not anxious.     ED Results / Procedures / Treatments   Labs (all labs ordered are listed, but only abnormal results are displayed) Labs Reviewed  COMPREHENSIVE METABOLIC PANEL - Abnormal; Notable for the following components:      Result Value   BUN 42 (*)    Creatinine, Ser 1.45 (*)    Total Protein 6.2 (*)    Total Bilirubin 1.9 (*)    GFR calc non Af Amer 29 (*)    GFR calc Af Amer 34 (*)    All other components within normal limits  URINALYSIS, ROUTINE W REFLEX MICROSCOPIC - Abnormal; Notable for the following components:   Color, Urine STRAW (*)    All other components within normal limits  URINE CULTURE  CBC WITH DIFFERENTIAL/PLATELET    EKG EKG Interpretation  Date/Time:  Tuesday August 06 2019 13:01:07 EST Ventricular Rate:  79 PR Interval:    QRS Duration: 176 QT Interval:  429 QTC Calculation: 492 R Axis:   -64 Text Interpretation: Sinus rhythm Prolonged PR interval Left bundle branch block no significant change since Jul 31 2019 Confirmed by Sherwood Gambler [phone]) on 08/06/2019 3:05:04 PM   Radiology CT Head Wo Contrast  Result Date: 08/06/2019 CLINICAL DATA:  Encephalopathy EXAM: CT HEAD WITHOUT CONTRAST CT CERVICAL SPINE WITHOUT CONTRAST TECHNIQUE: Multidetector CT imaging of the head and cervical spine was performed following the standard protocol without intravenous contrast. Multiplanar CT image reconstructions of the cervical spine were also generated. COMPARISON:  CT 05/04/2016 FINDINGS:  CT HEAD FINDINGS Brain: Motion degradation of the exam. Repeat exam with some improvement. Hypodensities within the LEFT and RIGHT external capsule is consistent remote white matter infarctions. Focal encephalomalacia within the LEFT occipital lobe is new from comparison exam 2017 (image 13/30. No clear acute cortical Concern cortical atrophy. Generalized subcortical white matter hypodensities. No extra-axial collections Vascular: No hyperdense vessel or unexpected calcification. Skull: Normal. Negative for fracture or focal lesion. Sinuses/Orbits: Paranasal sinuses and mastoid air cells are clear. Orbits are clear. Other: None. CT CERVICAL SPINE FINDINGS Alignment: Normal alignment of the cervical vertebral bodies. Skull base and vertebrae: Normal craniocervical junction. No loss of vertebral body height or disc height. Normal facet articulation. No evidence of fracture. Soft tissues and spinal canal: No prevertebral soft tissue swelling. No perispinal or epidural hematoma. Disc levels: Endplate spurring and disc space narrowing from C4 to the C7. No acute findings Upper chest: Clear Other: None IMPRESSION: 1. No acute intracranial findings. 2. Remote deep white matter external capsule infarctions. 3. Probable remote LEFT occipital lobe infarction. 4. Generalized atrophy and white matter microvascular disease. 5. No cervical spine fracture. 6. Multilevel disc osteophytic disease. Electronically Signed   By: Suzy Bouchard M.D.   On: 08/06/2019 14:41   CT Cervical Spine Wo Contrast  Result Date: 08/06/2019 CLINICAL DATA:  Encephalopathy EXAM: CT HEAD WITHOUT CONTRAST CT CERVICAL SPINE WITHOUT CONTRAST TECHNIQUE: Multidetector CT imaging of the head and cervical spine was performed following the standard protocol without intravenous contrast. Multiplanar CT image reconstructions of the cervical spine were also generated. COMPARISON:  CT 05/04/2016 FINDINGS: CT HEAD FINDINGS Brain: Motion degradation of the  exam. Repeat exam with some improvement. Hypodensities within the LEFT and RIGHT external capsule is consistent remote white matter infarctions. Focal encephalomalacia within the LEFT occipital lobe is new from comparison exam 2017 (image 13/30. No clear  acute cortical Concern cortical atrophy. Generalized subcortical white matter hypodensities. No extra-axial collections Vascular: No hyperdense vessel or unexpected calcification. Skull: Normal. Negative for fracture or focal lesion. Sinuses/Orbits: Paranasal sinuses and mastoid air cells are clear. Orbits are clear. Other: None. CT CERVICAL SPINE FINDINGS Alignment: Normal alignment of the cervical vertebral bodies. Skull base and vertebrae: Normal craniocervical junction. No loss of vertebral body height or disc height. Normal facet articulation. No evidence of fracture. Soft tissues and spinal canal: No prevertebral soft tissue swelling. No perispinal or epidural hematoma. Disc levels: Endplate spurring and disc space narrowing from C4 to the C7. No acute findings Upper chest: Clear Other: None IMPRESSION: 1. No acute intracranial findings. 2. Remote deep white matter external capsule infarctions. 3. Probable remote LEFT occipital lobe infarction. 4. Generalized atrophy and white matter microvascular disease. 5. No cervical spine fracture. 6. Multilevel disc osteophytic disease. Electronically Signed   By: Genevive Bi M.D.   On: 08/06/2019 14:41    Procedures Procedures (including critical care time)  Medications Ordered in ED Medications - No data to display  ED Course  I have reviewed the triage vital signs and the nursing notes.  Pertinent labs & imaging results that were available during my care of the patient were reviewed by me and considered in my medical decision making (see chart for details).    MDM Rules/Calculators/A&P                      Patient is in no distress now.  I discussed with the daughter, [name], who  advises not typical for patient.  However after discussion, plan to discharge her back to the facility as long as she does not seem to have recurrent delirium or be in distress.  She is more familiar with the nursing home.  Unclear cause of the episode this morning.  Care transferred to Dr. Clarene Duke with urine pending. Final Clinical Impression(s) / ED Diagnoses Final diagnoses:  None    Rx / DC Orders ED Discharge Orders    None       Pricilla Loveless, MD 08/06/19 1605

## 2019-08-06 NOTE — ED Triage Notes (Signed)
Pt BIB [facility] from [facility] following an unwitnessed fall. Found on floor by staff, pt on Eliquis, C-collar in place. Hx of dementia. Pt alert, oriented to person, disoriented to place, time, and situation. Ambulatory at baseline. VS stable, NAD noted, pt denies pain. CBG 111, per EMS.

## 2019-08-06 NOTE — ED Provider Notes (Signed)
I received pt in signout from Dr. Criss Alvine.  Briefly, she recently developed COVID-19 infection and was just discharged from the hospital, presented today from nursing facility after a fall.  Also noted to have some confusion beyond baseline dementia.  At time of signout, awaiting urinalysis. Dr. Criss Alvine had already spoken w/ daughter over the phone.   UA unremarkable.  C-collar removed after negative imaging.  Patient remained stable on room air on reassessment, no respiratory distress.  Discharged back to nursing facility.   [name], [name], MD 08/06/19 [phone]

## 2019-08-06 NOTE — ED Notes (Signed)
Patient transported to CT 

## 2019-08-07 ENCOUNTER — Inpatient Hospital Stay (HOSPITAL_COMMUNITY)
Admission: EM | Admit: 2019-08-07 | Discharge: 2019-08-14 | DRG: 683 | Disposition: A | Discharge status: Skilled Nursing Facility | Payer: Medicare Other | Source: Skilled Nursing Facility | Attending: Internal Medicine | Admitting: Internal Medicine

## 2019-08-07 ENCOUNTER — Other Ambulatory Visit: Payer: Self-pay

## 2019-08-07 ENCOUNTER — Emergency Department (HOSPITAL_COMMUNITY): Payer: Medicare Other

## 2019-08-07 DIAGNOSIS — Z8673 Personal history of transient ischemic attack (TIA), and cerebral infarction without residual deficits: Secondary | ICD-10-CM

## 2019-08-07 DIAGNOSIS — I252 Old myocardial infarction: Secondary | ICD-10-CM

## 2019-08-07 DIAGNOSIS — N179 Acute kidney failure, unspecified: Principal | ICD-10-CM | POA: Diagnosis present

## 2019-08-07 DIAGNOSIS — B948 Sequelae of other specified infectious and parasitic diseases: Secondary | ICD-10-CM

## 2019-08-07 DIAGNOSIS — F0390 Unspecified dementia without behavioral disturbance: Secondary | ICD-10-CM | POA: Diagnosis present

## 2019-08-07 DIAGNOSIS — E869 Volume depletion, unspecified: Secondary | ICD-10-CM | POA: Diagnosis present

## 2019-08-07 DIAGNOSIS — Z79899 Other long term (current) drug therapy: Secondary | ICD-10-CM

## 2019-08-07 DIAGNOSIS — W19XXXA Unspecified fall, initial encounter: Secondary | ICD-10-CM

## 2019-08-07 DIAGNOSIS — I110 Hypertensive heart disease with heart failure: Secondary | ICD-10-CM | POA: Diagnosis present

## 2019-08-07 DIAGNOSIS — R627 Adult failure to thrive: Secondary | ICD-10-CM | POA: Diagnosis present

## 2019-08-07 DIAGNOSIS — E039 Hypothyroidism, unspecified: Secondary | ICD-10-CM | POA: Diagnosis present

## 2019-08-07 DIAGNOSIS — R413 Other amnesia: Secondary | ICD-10-CM | POA: Diagnosis present

## 2019-08-07 DIAGNOSIS — Z66 Do not resuscitate: Secondary | ICD-10-CM | POA: Diagnosis present

## 2019-08-07 DIAGNOSIS — I255 Ischemic cardiomyopathy: Secondary | ICD-10-CM | POA: Diagnosis present

## 2019-08-07 DIAGNOSIS — J9601 Acute respiratory failure with hypoxia: Secondary | ICD-10-CM | POA: Diagnosis present

## 2019-08-07 DIAGNOSIS — I5042 Chronic combined systolic (congestive) and diastolic (congestive) heart failure: Secondary | ICD-10-CM | POA: Diagnosis present

## 2019-08-07 DIAGNOSIS — Z79890 Hormone replacement therapy (postmenopausal): Secondary | ICD-10-CM

## 2019-08-07 DIAGNOSIS — Z87891 Personal history of nicotine dependence: Secondary | ICD-10-CM

## 2019-08-07 DIAGNOSIS — E785 Hyperlipidemia, unspecified: Secondary | ICD-10-CM | POA: Diagnosis present

## 2019-08-07 DIAGNOSIS — E860 Dehydration: Secondary | ICD-10-CM | POA: Diagnosis present

## 2019-08-07 DIAGNOSIS — R296 Repeated falls: Secondary | ICD-10-CM | POA: Diagnosis present

## 2019-08-07 DIAGNOSIS — U071 COVID-19: Secondary | ICD-10-CM

## 2019-08-07 LAB — COMPREHENSIVE METABOLIC PANEL
ALT: 21 U/L (ref 0–44)
AST: 22 U/L (ref 15–41)
Albumin: 3.9 g/dL (ref 3.5–5.0)
Alkaline Phosphatase: 49 U/L (ref 38–126)
Anion gap: 14 (ref 5–15)
BUN: 54 mg/dL — ABNORMAL HIGH (ref 8–23)
CO2: 23 mmol/L (ref 22–32)
Calcium: 9.2 mg/dL (ref 8.9–10.3)
Chloride: 106 mmol/L (ref 98–111)
Creatinine, Ser: 1.82 mg/dL — ABNORMAL HIGH (ref 0.44–1.00)
GFR calc Af Amer: 26 mL/min — ABNORMAL LOW (ref >60)
GFR calc non Af Amer: 22 mL/min — ABNORMAL LOW (ref >60)
Glucose, Bld: 120 mg/dL — ABNORMAL HIGH (ref 70–99)
Potassium: 4.2 mmol/L (ref 3.5–5.1)
Sodium: 143 mmol/L (ref 135–145)
Total Bilirubin: 2.2 mg/dL — ABNORMAL HIGH (ref 0.3–1.2)
Total Protein: 6.4 g/dL — ABNORMAL LOW (ref 6.5–8.1)

## 2019-08-07 LAB — URINE CULTURE: Culture: <10000 — AB

## 2019-08-07 LAB — CK: Total CK: 42 U/L (ref 38–234)

## 2019-08-07 NOTE — ED Notes (Signed)
Pt transported to CT ?

## 2019-08-07 NOTE — ED Notes (Addendum)
Fall alarm pad placed under pt due to pt being high fall risk and in ED for a fall. Pt undressed into gown. Pt has call bell within reach.

## 2019-08-07 NOTE — ED Provider Notes (Signed)
[facility]-EMERGENCY DEPT Provider Note   CSN: [id number] Arrival date & time: 08/07/19  2052     History Chief Complaint  Patient presents with  . Fall    [name] is a 86 y.o. female.  The history is provided by the nursing home, medical records and the EMS personnel. No language interpreter was used.  Fall This is a recurrent problem. The current episode started 1 to 2 hours ago. The problem occurs daily. The problem has not changed since onset.Associated symptoms include headaches. Pertinent negatives include no chest pain, no abdominal pain and no shortness of breath. Nothing aggravates the symptoms. Nothing relieves the symptoms. She has tried nothing for the symptoms. The treatment provided no relief.   LVL 5  Caveat for dementia     Past Medical History:  Diagnosis Date  . Advanced age   . Cardiac arrest [facility]) 2007   Occurred during planned ovarian surgery that resolved spontaneously after her stomach was deflated.   . CVA (cerebral infarction) Oct 2010   "light stroke". Plavix started.  . Grief    from husband's death  . Hyperlipemia   . Hypertension   . Hypothyroidism   . Ischemic cardiomyopathy June 2009   managed medically; EF is 20 to 25%  . Left bundle branch block   . Left heart failure (HCC)    EF is 20 to 25%; She is managed conservatively  . Stroke (HCC)   . Subendocardial myocardial infarction [facility]) June 2009    Patient Active Problem List   Diagnosis Date Noted  . Palliative care encounter   . Acute hypoxemic respiratory failure due to COVID-19 (HCC) 07/31/2019  . Advanced age 23/20/2021  . AKI (acute kidney injury) (HCC) 07/31/2019  . Memory deficit 06/06/2017  . Abnormal EKG 06/06/2017  . Recurrent falls 06/05/2017  . Diarrhea 04/20/2017  . Colitis 04/20/2017  . HFrEF (heart failure with reduced ejection fraction) (HCC) 04/20/2017  . Hypokalemia 04/20/2017  . Elevated bilirubin 04/20/2017  . CVA (cerebral  infarction) 08/04/2011  . Ischemic cardiomyopathy   . Hypothyroidism   . Hyperlipemia     Past Surgical History:  Procedure Laterality Date  . CATARACT EXTRACTION  09/18/2012   right eye  . DILATION AND CURETTAGE OF UTERUS    . OVARIAN CYST REMOVAL     2007-2008     OB History   No obstetric history on file.     Family History  Problem Relation Age of Onset  . Aneurysm Father        ruptured  . Heart attack Mother   . Heart defect Mother        enlarged heart    Social History   Tobacco Use  . Smoking status: Former Smoker    Quit date: 07/11/1968    Years since quitting: 51.1  . Smokeless tobacco: Never Used  Substance Use Topics  . Alcohol use: No  . Drug use: No    Home Medications Prior to Admission medications   Medication Sig Start Date End Date Taking? Authorizing Provider  acetaminophen (TYLENOL) 500 MG tablet Take 1 tablet (500 mg total) by mouth every 6 (six) hours as needed for mild pain, moderate pain, fever or headache. 06/02/17   Hedges, Tinnie Gens, PA-C  atorvastatin (LIPITOR) 10 MG tablet Take 1 tablet (10 mg total) by mouth daily. 06/14/17   Medina-Vargas, Monina C, NP  cholecalciferol (VITAMIN D) 1000 units tablet Take 1 tablet (1,000 Units total) by mouth daily.  Patient not taking: Reported on 08/06/2019 06/14/17   Medina-Vargas, Monina C, NP  Cholecalciferol (VITAMIN D3) 50 MCG (2000 UT) TABS Take 2,000 Units by mouth daily.    [provider]  dexamethasone (DECADRON) 6 MG tablet Take 1 tablet (6 mg total) by mouth daily for 5 days. 08/05/19 08/10/19  Alma Friendly, MD  digoxin (DIGITEK) 0.125 MG tablet TAKE 1 TABLET (0.125 MG TOTAL) BY MOUTH DAILY. Patient taking differently: Take 0.125 mg by mouth daily.  06/14/17   Medina-Vargas, Monina C, NP  diphenoxylate-atropine (LOMOTIL) 2.5-0.025 MG tablet Take 1 tablet by mouth daily.  07/23/19   [provider]  famotidine (PEPCID) 20 MG tablet Take 1 tablet (20 mg total) by mouth at  bedtime. 08/05/19 09/04/19  Alma Friendly, MD  furosemide (LASIX) 20 MG tablet Take 1 tablet (20 mg total) by mouth daily. Patient taking differently: Take 40 mg by mouth daily.  06/14/17   Medina-Vargas, Monina C, NP  levothyroxine (SYNTHROID, LEVOTHROID) 75 MCG tablet Take 75 mcg by mouth daily before breakfast. 08/24/18   [provider]  meclizine (ANTIVERT) 25 MG tablet Take 12.5 mg by mouth 2 (two) times daily as needed for dizziness.  04/02/19   [provider]  Multiple Vitamins-Minerals (CENTRUM SILVER) tablet Take 1 tablet by mouth daily. 06/02/17   Hedges, Dellis Filbert, PA-C  nitroGLYCERIN (NITRODUR - DOSED IN MG/24 HR) 0.2 mg/hr patch PLACE ONE PATCH ONTO SKIN ONCE DAILY AT BEDTIME Patient taking differently: Place 0.2 mg onto the skin daily.  06/14/17   Medina-Vargas, Monina C, NP  nitroGLYCERIN (NITROSTAT) 0.4 MG SL tablet Place 1 tablet (0.4 mg total) under the tongue every 5 (five) minutes as needed for chest pain. 06/14/17   Medina-Vargas, Monina C, NP  potassium chloride (K-DUR) 10 MEQ tablet TAKE 1 TABLET (10 MEQ TOTAL) BY MOUTH DAILY. Patient taking differently: Take 10 mEq by mouth daily.  06/14/17   Medina-Vargas, Monina C, NP    Allergies    Tape, Novocain [procaine], and Procaine hcl  Review of Systems   Review of Systems  Unable to perform ROS: Dementia  Constitutional: Positive for appetite change. Negative for chills, fatigue and fever.  HENT: Negative for congestion.   Respiratory: Negative for cough, chest tightness and shortness of breath.   Cardiovascular: Negative for chest pain.  Gastrointestinal: Negative for abdominal pain.  Genitourinary: Negative for dysuria.  Musculoskeletal: Negative for back pain.  Skin: Negative for wound.  Neurological: Positive for headaches.  Psychiatric/Behavioral: Negative for agitation.  All other systems reviewed and are negative.   Physical Exam Updated Vital Signs BP 115/85 (BP Location: Left Arm)    Pulse 78   Temp 97.7 F (36.5 C) (Oral)   Resp 15   Wt 56.2 kg   SpO2 95%   BMI 19.71 kg/m   Physical Exam Vitals and nursing note reviewed.  Constitutional:      General: She is not in acute distress.    Appearance: She is well-developed. She is not ill-appearing, toxic-appearing or diaphoretic.  HENT:     Head: Normocephalic and atraumatic.     Right Ear: External ear normal.     Left Ear: External ear normal.     Nose: Nose normal. No congestion or rhinorrhea.     Mouth/Throat:     Mouth: Mucous membranes are moist.     Pharynx: No oropharyngeal exudate.  Eyes:     Conjunctiva/sclera: Conjunctivae normal.     Pupils: Pupils are equal, round, and reactive  to light.  Cardiovascular:     Rate and Rhythm: Normal rate.     Pulses: Normal pulses.     Heart sounds: No murmur.  Pulmonary:     Effort: Pulmonary effort is normal. No respiratory distress.     Breath sounds: No stridor. No wheezing, rhonchi or rales.  Chest:     Chest wall: No tenderness.  Abdominal:     General: Abdomen is flat. There is no distension.     Tenderness: There is no abdominal tenderness. There is no right CVA tenderness, left CVA tenderness or rebound.  Musculoskeletal:     Cervical back: Normal range of motion and neck supple. No tenderness.  Skin:    General: Skin is warm.     Findings: No erythema or rash.  Neurological:     Mental Status: She is alert. She is disoriented.     Motor: No weakness or abnormal muscle tone.     Deep Tendon Reflexes: Reflexes are normal and symmetric.     ED Results / Procedures / Treatments   Labs (all labs ordered are listed, but only abnormal results are displayed) Labs Reviewed  CBC WITH DIFFERENTIAL/PLATELET - Abnormal; Notable for the following components:      Result Value   WBC 11.0 (*)    Lymphs Abs 4.5 (*)    Abs Immature Granulocytes 0.19 (*)    All other components within normal limits  COMPREHENSIVE METABOLIC PANEL - Abnormal; Notable for  the following components:   Glucose, Bld 120 (*)    BUN 54 (*)    Creatinine, Ser 1.82 (*)    Total Protein 6.4 (*)    Total Bilirubin 2.2 (*)    GFR calc non Af Amer 22 (*)    GFR calc Af Amer 26 (*)    All other components within normal limits  CK  CBC  CREATININE, SERUM  ABO/RH    EKG None  Radiology CT Head Wo Contrast  Result Date: 08/07/2019 CLINICAL DATA:  Altered mental status, COVID-19 diagnosis 1.5 weeks prior with admission for delirium on 08/06/2019 EXAM: CT HEAD WITHOUT CONTRAST TECHNIQUE: Contiguous axial images were obtained from the base of the skull through the vertex without intravenous contrast. COMPARISON:  CT head 08/06/2019, 05/04/2016 FINDINGS: Brain: Image quality is degraded due to patient motion. Redemonstration of a region of gliosis in the left occipital lobe which is unchanged from 1 day prior but new since 2017 comparisons. Overall finding favors a remote interval infarct but remains grossly age indeterminate. Prior lacunar infarcts are noted in the bilateral basal ganglia as is some stable senescent mineralization. No other sites concerning for acute infarction. No visible hemorrhage, hydrocephalus, extra-axial collection or mass lesion/mass effect. Symmetric moderate prominence of the ventricles, cisterns and sulci compatible with parenchymal volume loss. Confluent areas of white matter hypoattenuation are most compatible with chronic microvascular angiopathy. Vascular: Atherosclerotic calcification of the carotid siphons and intradural vertebral arteries. No hyperdense vessel. Skull: No calvarial fracture or suspicious osseous lesion. No scalp swelling or hematoma. Sinuses/Orbits: Paranasal sinuses and mastoid air cells are predominantly clear. Debris in the left external auditory canal. Orbital structures are unremarkable aside from prior lens extractions. Other: None IMPRESSION: Image quality degraded due to motion artifact. Redemonstration of a region of  gliosis in the left occipital lobe which is unchanged from 1 day prior but new since 2017 comparisons. Overall finding favors a remote interval infarct but remains grossly age indeterminate. If there is concern that this may be  subacute, consider MR for further evaluation though only if patient is able to remain still for the procedure. Remote bilateral basal ganglia lacunar infarcts. Extensive chronic microvascular angiopathy and moderate parenchymal volume loss, similar to priors. Intracranial atherosclerosis. Debris in the left external auditory canal, correlate for cerumen impaction. Electronically Signed   By: Kreg Shropshire M.D.   On: 08/07/2019 23:41   CT Head Wo Contrast  Result Date: 08/06/2019 CLINICAL DATA:  Encephalopathy EXAM: CT HEAD WITHOUT CONTRAST CT CERVICAL SPINE WITHOUT CONTRAST TECHNIQUE: Multidetector CT imaging of the head and cervical spine was performed following the standard protocol without intravenous contrast. Multiplanar CT image reconstructions of the cervical spine were also generated. COMPARISON:  CT 05/04/2016 FINDINGS: CT HEAD FINDINGS Brain: Motion degradation of the exam. Repeat exam with some improvement. Hypodensities within the LEFT and RIGHT external capsule is consistent remote white matter infarctions. Focal encephalomalacia within the LEFT occipital lobe is new from comparison exam 2017 (image 13/30. No clear acute cortical Concern cortical atrophy. Generalized subcortical white matter hypodensities. No extra-axial collections Vascular: No hyperdense vessel or unexpected calcification. Skull: Normal. Negative for fracture or focal lesion. Sinuses/Orbits: Paranasal sinuses and mastoid air cells are clear. Orbits are clear. Other: None. CT CERVICAL SPINE FINDINGS Alignment: Normal alignment of the cervical vertebral bodies. Skull base and vertebrae: Normal craniocervical junction. No loss of vertebral body height or disc height. Normal facet articulation. No evidence of  fracture. Soft tissues and spinal canal: No prevertebral soft tissue swelling. No perispinal or epidural hematoma. Disc levels: Endplate spurring and disc space narrowing from C4 to the C7. No acute findings Upper chest: Clear Other: None IMPRESSION: 1. No acute intracranial findings. 2. Remote deep white matter external capsule infarctions. 3. Probable remote LEFT occipital lobe infarction. 4. Generalized atrophy and white matter microvascular disease. 5. No cervical spine fracture. 6. Multilevel disc osteophytic disease. Electronically Signed   By: Genevive Bi M.D.   On: 08/06/2019 14:41   CT Cervical Spine Wo Contrast  Result Date: 08/06/2019 CLINICAL DATA:  Encephalopathy EXAM: CT HEAD WITHOUT CONTRAST CT CERVICAL SPINE WITHOUT CONTRAST TECHNIQUE: Multidetector CT imaging of the head and cervical spine was performed following the standard protocol without intravenous contrast. Multiplanar CT image reconstructions of the cervical spine were also generated. COMPARISON:  CT 05/04/2016 FINDINGS: CT HEAD FINDINGS Brain: Motion degradation of the exam. Repeat exam with some improvement. Hypodensities within the LEFT and RIGHT external capsule is consistent remote white matter infarctions. Focal encephalomalacia within the LEFT occipital lobe is new from comparison exam 2017 (image 13/30. No clear acute cortical Concern cortical atrophy. Generalized subcortical white matter hypodensities. No extra-axial collections Vascular: No hyperdense vessel or unexpected calcification. Skull: Normal. Negative for fracture or focal lesion. Sinuses/Orbits: Paranasal sinuses and mastoid air cells are clear. Orbits are clear. Other: None. CT CERVICAL SPINE FINDINGS Alignment: Normal alignment of the cervical vertebral bodies. Skull base and vertebrae: Normal craniocervical junction. No loss of vertebral body height or disc height. Normal facet articulation. No evidence of fracture. Soft tissues and spinal canal: No  prevertebral soft tissue swelling. No perispinal or epidural hematoma. Disc levels: Endplate spurring and disc space narrowing from C4 to the C7. No acute findings Upper chest: Clear Other: None IMPRESSION: 1. No acute intracranial findings. 2. Remote deep white matter external capsule infarctions. 3. Probable remote LEFT occipital lobe infarction. 4. Generalized atrophy and white matter microvascular disease. 5. No cervical spine fracture. 6. Multilevel disc osteophytic disease. Electronically Signed   By: Genevive Bi  M.D.   On: 08/06/2019 14:41    Procedures Procedures (including critical care time)  Medications Ordered in ED Medications  heparin injection 5,000 Units (has no administration in time range)  sodium chloride flush (NS) 0.9 % injection 3 mL (has no administration in time range)  sodium chloride flush (NS) 0.9 % injection 3 mL (has no administration in time range)  0.9 %  sodium chloride infusion (has no administration in time range)  ascorbic acid (VITAMIN C) tablet 500 mg (has no administration in time range)  zinc sulfate capsule 220 mg (has no administration in time range)  0.9 %  sodium chloride infusion (has no administration in time range)  sodium chloride 0.9 % bolus 500 mL (500 mLs Intravenous New Bag/Given 08/08/19 0035)    ED Course  I have reviewed the triage vital signs and the nursing notes.  Pertinent labs & imaging results that were available during my care of the patient were reviewed by me and considered in my medical decision making (see chart for details).    MDM Rules/Calculators/A&P                      [name] is a 86 y.o. female with a past medical history significant for heart failure, hyperlipidemia, hypothyroidism, prior stroke, prior cardiac arrest, CAD, and current Covid infection who presents with recurrent fall to the ground.  According to the facility, [facility], and EMS reports nursing, patient was found on the ground again  and with an unwitnessed fall.  It is unclear how long patient was on the ground.  Patient is acting more confused than her baseline as she was yesterday during ED visit.  She had work-up yesterday that did not show UTI and she was discharged home after similar episode of being found on the floor.  When I asked the patient, she denies any complaints other than feeling cold.  She denies any neck pain, chest pain, back pain, abdominal pain, or extremity pains.  She does say she has some mild headache.  She does not remember a fall.  She is curled up on the exam room bed and is disoriented.  On arrival, vital signs are reassuring.  Patient is in no distress.  On my exam, lungs are clear and chest is nontender.  Abdomen is nontender.  Back is nontender.  Neck is nontender.  She does not have evidence of acute trauma on my initial exam other than some mild bruising on her arms which are nontender on my exam.  Good pulses in extremities.  Oxygen saturations are in the mid 90s on room air.  Patient is unable to answer all questions appropriately however we will repeat blood work as the facility reports she was not eating and drinking today and acting slightly more confused than baseline.  We will get a repeat head CT although she had CT yesterday.  Cannot rule out intracranial injury as she was found on the ground for unknown period of time.  We will get CK to look for rhabdo.    Anticipate reassessment of patient after work-up.  12:20 AM CT had returned showing no new intracranial injury from yesterday.  Labs show that creatinine is worsening.  Is now up to 1.82 up from 1.21 three days ago.  With the facility reporting that she was not eating or drinking today, worsening AKI, and the recurrent episodes of her falling to the ground, will give her fluids and admit for gentle  rehydration and creatinine monitoring in the setting of COVID-19 infection.    Final Clinical Impression(s) / ED Diagnoses Final  diagnoses:  COVID-19  AKI (acute kidney injury) (HCC)  Dehydration  Fall, initial encounter    Clinical Impression: 1. COVID-19   2. AKI (acute kidney injury) (HCC)   3. Dehydration   4. Fall, initial encounter     Disposition: Admit  This note was prepared with assistance of Conservation officer, historic buildingsDragon voice recognition software. Occasional wrong-word or sound-a-like substitutions may have occurred due to the inherent limitations of voice recognition software.      Glendon Fiser, Canary Brimhristopher J, MD 08/08/19 [phone]

## 2019-08-07 NOTE — ED Triage Notes (Signed)
Pt diagnosed with covid 1.5 weeks ago and went to [facility] ED for delirium on 1/26 and was discharged back to her facility, [facility]. Today EMS was called for similar symptoms. Altered Mental Status and witness falled. Pt attempted to get off of her couch and collapsed to the floor. No appetite. EMS placed a brace to her neck. Baseline she normally is a&ox2 and currently is only oriented to self. Delay response per EMS.  Vitals 100/80, 70 HR, CBG 174, O2 92% on Rm Air. PIV 20 placed in her Lt. FA.

## 2019-08-08 ENCOUNTER — Other Ambulatory Visit: Payer: Self-pay

## 2019-08-08 ENCOUNTER — Encounter (HOSPITAL_COMMUNITY): Payer: Self-pay | Admitting: Family Medicine

## 2019-08-08 DIAGNOSIS — U071 COVID-19: Secondary | ICD-10-CM | POA: Diagnosis not present

## 2019-08-08 DIAGNOSIS — W19XXXA Unspecified fall, initial encounter: Secondary | ICD-10-CM | POA: Diagnosis not present

## 2019-08-08 DIAGNOSIS — Z79899 Other long term (current) drug therapy: Secondary | ICD-10-CM | POA: Diagnosis not present

## 2019-08-08 DIAGNOSIS — B948 Sequelae of other specified infectious and parasitic diseases: Secondary | ICD-10-CM | POA: Diagnosis not present

## 2019-08-08 DIAGNOSIS — E869 Volume depletion, unspecified: Secondary | ICD-10-CM | POA: Diagnosis present

## 2019-08-08 DIAGNOSIS — R627 Adult failure to thrive: Secondary | ICD-10-CM | POA: Diagnosis present

## 2019-08-08 DIAGNOSIS — I110 Hypertensive heart disease with heart failure: Secondary | ICD-10-CM | POA: Diagnosis present

## 2019-08-08 DIAGNOSIS — R296 Repeated falls: Secondary | ICD-10-CM | POA: Diagnosis present

## 2019-08-08 DIAGNOSIS — Z66 Do not resuscitate: Secondary | ICD-10-CM | POA: Diagnosis present

## 2019-08-08 DIAGNOSIS — E039 Hypothyroidism, unspecified: Secondary | ICD-10-CM | POA: Diagnosis present

## 2019-08-08 DIAGNOSIS — Z79890 Hormone replacement therapy (postmenopausal): Secondary | ICD-10-CM | POA: Diagnosis not present

## 2019-08-08 DIAGNOSIS — N179 Acute kidney failure, unspecified: Principal | ICD-10-CM

## 2019-08-08 DIAGNOSIS — I252 Old myocardial infarction: Secondary | ICD-10-CM | POA: Diagnosis not present

## 2019-08-08 DIAGNOSIS — E785 Hyperlipidemia, unspecified: Secondary | ICD-10-CM | POA: Diagnosis present

## 2019-08-08 DIAGNOSIS — I5042 Chronic combined systolic (congestive) and diastolic (congestive) heart failure: Secondary | ICD-10-CM | POA: Diagnosis present

## 2019-08-08 DIAGNOSIS — I255 Ischemic cardiomyopathy: Secondary | ICD-10-CM | POA: Diagnosis present

## 2019-08-08 DIAGNOSIS — Z8673 Personal history of transient ischemic attack (TIA), and cerebral infarction without residual deficits: Secondary | ICD-10-CM | POA: Diagnosis not present

## 2019-08-08 DIAGNOSIS — F0390 Unspecified dementia without behavioral disturbance: Secondary | ICD-10-CM | POA: Diagnosis present

## 2019-08-08 DIAGNOSIS — E860 Dehydration: Secondary | ICD-10-CM | POA: Diagnosis present

## 2019-08-08 DIAGNOSIS — Z87891 Personal history of nicotine dependence: Secondary | ICD-10-CM | POA: Diagnosis not present

## 2019-08-08 LAB — CBC WITH DIFFERENTIAL/PLATELET
Abs Immature Granulocytes: 0.19 10*3/uL — ABNORMAL HIGH (ref 0.00–0.07)
Basophils Absolute: 0.1 10*3/uL (ref 0.0–0.1)
Basophils Relative: 1 %
Eosinophils Absolute: 0.1 10*3/uL (ref 0.0–0.5)
Eosinophils Relative: 1 %
HCT: 42.6 % (ref 36.0–46.0)
Hemoglobin: 13.5 g/dL (ref 12.0–15.0)
Immature Granulocytes: 2 %
Lymphocytes Relative: 41 %
Lymphs Abs: 4.5 10*3/uL — ABNORMAL HIGH (ref 0.7–4.0)
MCH: 30.2 pg (ref 26.0–34.0)
MCHC: 31.7 g/dL (ref 30.0–36.0)
MCV: 95.3 fL (ref 80.0–100.0)
Monocytes Absolute: 0.9 10*3/uL (ref 0.1–1.0)
Monocytes Relative: 8 %
Neutro Abs: 5.2 10*3/uL (ref 1.7–7.7)
Neutrophils Relative %: 47 %
Platelets: 185 10*3/uL (ref 150–400)
RBC: 4.47 MIL/uL (ref 3.87–5.11)
RDW: 13.6 % (ref 11.5–15.5)
WBC: 11 10*3/uL — ABNORMAL HIGH (ref 4.0–10.5)
nRBC: 0 % (ref 0.0–0.2)

## 2019-08-08 LAB — ABO/RH: ABO/RH(D): O POS

## 2019-08-08 LAB — PATHOLOGIST SMEAR REVIEW: Path Review: REACTIVE

## 2019-08-08 MED ORDER — SODIUM CHLORIDE 0.9 % IV BOLUS
500.0000 mL | Freq: Once | INTRAVENOUS | Status: AC
  Administered 2019-08-08: 500 mL via INTRAVENOUS

## 2019-08-08 MED ORDER — SODIUM CHLORIDE 0.9 % IV SOLN: INTRAVENOUS | Status: DC

## 2019-08-08 MED ORDER — HEPARIN SODIUM (PORCINE) 5000 UNIT/ML IJ SOLN
5000.0000 [IU] | Freq: Three times a day (TID) | INTRAMUSCULAR | Status: DC
  Administered 2019-08-08 – 2019-08-14 (×19): 5000 [IU] via SUBCUTANEOUS
  Filled 2019-08-08 (×19): qty 1

## 2019-08-08 MED ORDER — SODIUM CHLORIDE 0.9% FLUSH
3.0000 mL | Freq: Two times a day (BID) | INTRAVENOUS | Status: DC
  Administered 2019-08-08 – 2019-08-11 (×9): 3 mL via INTRAVENOUS
  Administered 2019-08-12: 21:00:00 5 mL via INTRAVENOUS
  Administered 2019-08-12 – 2019-08-14 (×4): 3 mL via INTRAVENOUS

## 2019-08-08 MED ORDER — SODIUM CHLORIDE 0.9 % IV SOLN: 250.0000 mL | INTRAVENOUS | Status: DC | PRN

## 2019-08-08 MED ORDER — ZINC SULFATE 220 (50 ZN) MG PO CAPS
220.0000 mg | ORAL_CAPSULE | Freq: Every day | ORAL | Status: DC
  Administered 2019-08-08 – 2019-08-14 (×7): 220 mg via ORAL
  Filled 2019-08-08 (×7): qty 1

## 2019-08-08 MED ORDER — SODIUM CHLORIDE 0.9% FLUSH: 3.0000 mL | INTRAVENOUS | Status: DC | PRN

## 2019-08-08 MED ORDER — ASCORBIC ACID 500 MG PO TABS
500.0000 mg | ORAL_TABLET | Freq: Every day | ORAL | Status: DC
  Administered 2019-08-08 – 2019-08-14 (×7): 500 mg via ORAL
  Filled 2019-08-08 (×7): qty 1

## 2019-08-08 MED ORDER — SODIUM CHLORIDE 0.9 % IV SOLN: INTRAVENOUS | Status: AC

## 2019-08-08 MED ORDER — ENSURE ENLIVE PO LIQD
237.0000 mL | Freq: Two times a day (BID) | ORAL | Status: DC
  Administered 2019-08-08 – 2019-08-13 (×9): 237 mL via ORAL

## 2019-08-08 NOTE — ED Notes (Signed)
Attempted to call report to [facility] [phone]). Accepting nurse was with pt and will call back.

## 2019-08-08 NOTE — ED Notes (Signed)
[facility] at bedside to transport pt  

## 2019-08-08 NOTE — H&P (Signed)
History and Physical    SAHITHI ORDOYNE Duarte:[id number] DOB: 11/18/1916 DOA: 08/07/2019  PCP: Burton Apley, MD  Patient coming from: Nursing home  Chief Complaint: Frequent falls not eating well  HPI: [name] is a 86 y.o. female with medical history significant of dementia, hypertension, prior CVA sent in from her nursing home because of frequent falls and not eating and drinking very well.  Patient cannot provide any history secondary to her dementia.  She has been more confused than normal.  Today patient found to have acute kidney injury.  She is recently had Covid and was treated with remdesivir and Decadron discharged on August 05, 2019.  Patient today found to have acute kidney injury with a creatinine bump up to 1.8.  O2 sats are normal on room air.  Patient be referred for admission for dehydration in the setting of continued weakness after Covid infection.  Review of Systems: Unobtainable secondary to dementia  Past Medical History:  Diagnosis Date  . Advanced age   . Cardiac arrest [facility]) 2007   Occurred during planned ovarian surgery that resolved spontaneously after her stomach was deflated.   . CVA (cerebral infarction) Oct 2010   "light stroke". Plavix started.  . Grief    from husband's death  . Hyperlipemia   . Hypertension   . Hypothyroidism   . Ischemic cardiomyopathy June 2009   managed medically; EF is 20 to 25%  . Left bundle branch block   . Left heart failure (HCC)    EF is 20 to 25%; She is managed conservatively  . Stroke (HCC)   . Subendocardial myocardial infarction [facility]) June 2009    Past Surgical History:  Procedure Laterality Date  . CATARACT EXTRACTION  09/18/2012   right eye  . DILATION AND CURETTAGE OF UTERUS    . OVARIAN CYST REMOVAL     2007-2008     reports that she quit smoking about 51 years ago. She has never used smokeless tobacco. She reports that she does not drink alcohol or use drugs.  Allergies  Allergen Reactions    . Tape Other (See Comments)    SKIN IS THIN- WILL TEAR EASILY!!!!!  . Novocain [Procaine] Other (See Comments)    "I passed out"  . Procaine Hcl Other (See Comments) and Hypertension    "PASSED OUT"    Family History  Problem Relation Age of Onset  . Aneurysm Father        ruptured  . Heart attack Mother   . Heart defect Mother        enlarged heart    Prior to Admission medications   Medication Sig Start Date End Date Taking? Authorizing Provider  acetaminophen (TYLENOL) 500 MG tablet Take 1 tablet (500 mg total) by mouth every 6 (six) hours as needed for mild pain, moderate pain, fever or headache. 06/02/17  Yes Hedges, Tinnie Gens, PA-C  atorvastatin (LIPITOR) 10 MG tablet Take 1 tablet (10 mg total) by mouth daily. 06/14/17  Yes Medina-Vargas, Monina C, NP  Cholecalciferol (VITAMIN D) 50 MCG (2000 UT) CAPS Take 1 capsule by mouth daily.   Yes [provider]  dexamethasone (DECADRON) 6 MG tablet Take 1 tablet (6 mg total) by mouth daily for 5 days. 08/05/19 08/10/19 Yes Briant Cedar, MD  digoxin (DIGITEK) 0.125 MG tablet TAKE 1 TABLET (0.125 MG TOTAL) BY MOUTH DAILY. Patient taking differently: Take 0.125 mg by mouth daily.  06/14/17  Yes Medina-Vargas, Monina C, NP  diphenoxylate-atropine (LOMOTIL)  2.5-0.025 MG tablet Take 1 tablet by mouth daily.  07/23/19  Yes [provider]  famotidine (PEPCID) 20 MG tablet Take 1 tablet (20 mg total) by mouth at bedtime. Patient taking differently: Take 20 mg by mouth 2 (two) times daily.  08/05/19 09/04/19 Yes Alma Friendly, MD  furosemide (LASIX) 20 MG tablet Take 1 tablet (20 mg total) by mouth daily. Patient taking differently: Take 40 mg by mouth daily.  06/14/17  Yes Medina-Vargas, Monina C, NP  levothyroxine (SYNTHROID, LEVOTHROID) 75 MCG tablet Take 75 mcg by mouth daily before breakfast. 08/24/18  Yes [provider]  meclizine (ANTIVERT) 25 MG tablet Take 12.5 mg by mouth 2 (two) times daily as needed  for dizziness.  04/02/19  Yes [provider]  Multiple Vitamins-Minerals (CENTRUM SILVER) tablet Take 1 tablet by mouth daily. 06/02/17  Yes Hedges, Dellis Filbert, PA-C  nitroGLYCERIN (NITRODUR - DOSED IN MG/24 HR) 0.2 mg/hr patch PLACE ONE PATCH ONTO SKIN ONCE DAILY AT BEDTIME Patient taking differently: Place 0.2 mg onto the skin daily.  06/14/17  Yes Medina-Vargas, Monina C, NP  nitroGLYCERIN (NITROSTAT) 0.4 MG SL tablet Place 1 tablet (0.4 mg total) under the tongue every 5 (five) minutes as needed for chest pain. 06/14/17  Yes Medina-Vargas, Monina C, NP  potassium chloride (K-DUR) 10 MEQ tablet TAKE 1 TABLET (10 MEQ TOTAL) BY MOUTH DAILY. Patient taking differently: Take 10 mEq by mouth daily.  06/14/17  Yes Medina-Vargas, Monina C, NP  cholecalciferol (VITAMIN D) 1000 units tablet Take 1 tablet (1,000 Units total) by mouth daily. Patient not taking: Reported on 08/07/2019 06/14/17   Nickola Major, NP    Physical Exam: Vitals:   08/07/19 2230 08/07/19 2300 08/08/19 0000 08/08/19 0030  BP: 120/83 100/70 126/81 109/79  Pulse: 73 81 74 66  Resp: (!) 21 16 20 18   Temp:      TempSrc:      SpO2: 94% 95% 90% 91%  Weight:          Constitutional: NAD, calm, comfortable Vitals:   08/07/19 2230 08/07/19 2300 08/08/19 0000 08/08/19 0030  BP: 120/83 100/70 126/81 109/79  Pulse: 73 81 74 66  Resp: (!) 21 16 20 18   Temp:      TempSrc:      SpO2: 94% 95% 90% 91%  Weight:       Eyes: PERRL, lids and conjunctivae normal ENMT: Mucous membranes are moist. Posterior pharynx clear of any exudate or lesions.Normal dentition.  Neck: normal, supple, no masses, no thyromegaly Respiratory: clear to auscultation bilaterally, no wheezing, no crackles. Normal respiratory effort. No accessory muscle use.  Cardiovascular: Regular rate and rhythm, no murmurs / rubs / gallops. No extremity edema. 2+ pedal pulses. No carotid bruits.  Abdomen: no tenderness, no masses palpated. No  hepatosplenomegaly. Bowel sounds positive.  Musculoskeletal: no clubbing / cyanosis. No joint deformity upper and lower extremities. Good ROM, no contractures. Normal muscle tone.  Skin: no rashes, lesions, ulcers. No induration Neurologic: CN 2-12 grossly intact. Sensation intact, DTR normal. Strength 5/5 in all 4.  Psychiatric: Not normal judgment and insight. Alert and oriented x 0. Normal mood.    Labs on Admission: I have personally reviewed following labs and imaging studies  CBC: Recent Labs  Lab 08/03/19 0604 08/04/19 0319 08/05/19 0340 08/06/19 1323 08/07/19 2254  WBC 8.9 6.7 7.3 9.7 11.0*  NEUTROABS 5.2 3.2 3.1 5.5 5.2  HGB 10.5* 10.9* 11.5* 13.5 13.5  HCT 33.7* 33.6* 35.2* 41.8 42.6  MCV 98.0 94.1 93.6 93.7 95.3  PLT 124* 142* 153 178 185   Basic Metabolic Panel: Recent Labs  Lab 08/03/19 0604 08/04/19 0319 08/05/19 0340 08/06/19 1323 08/07/19 2254  NA 138 141 140 145 143  K 4.8 4.5 4.2 4.1 4.2  CL 104 105 106 109 106  CO2 25 26 26 26 23   GLUCOSE 119* 124* 125* 96 120*  BUN 59* 52* 46* 42* 54*  CREATININE 1.33* 1.29* 1.21* 1.45* 1.82*  CALCIUM 8.2* 8.5* 8.6* 9.2 9.2   GFR: Estimated Creatinine Clearance: 14.6 mL/min (A) (by C-G formula based on SCr of 1.82 mg/dL (H)). Liver Function Tests: Recent Labs  Lab 08/03/19 0604 08/04/19 0319 08/05/19 0340 08/06/19 1323 08/07/19 2254  AST 33 33 28 32 22  ALT 20 22 22 25 21   ALKPHOS 37* 39 40 45 49  BILITOT 1.1 1.2 1.6* 1.9* 2.2*  PROT 5.3* 5.5* 5.7* 6.2* 6.4*  ALBUMIN 3.1* 3.4* 3.4* 3.6 3.9   No results for input(s): LIPASE, AMYLASE in the last 168 hours. No results for input(s): AMMONIA in the last 168 hours. Coagulation Profile: No results for input(s): INR, PROTIME in the last 168 hours. Cardiac Enzymes: Recent Labs  Lab 08/07/19 2254  CKTOTAL 42   BNP (last 3 results) No results for input(s): PROBNP in the last 8760 hours. HbA1C: No results for input(s): HGBA1C in the last 72  hours. CBG: No results for input(s): GLUCAP in the last 168 hours. Lipid Profile: No results for input(s): CHOL, HDL, LDLCALC, TRIG, CHOLHDL, LDLDIRECT in the last 72 hours. Thyroid Function Tests: No results for input(s): TSH, T4TOTAL, FREET4, T3FREE, THYROIDAB in the last 72 hours. Anemia Panel: No results for input(s): VITAMINB12, FOLATE, FERRITIN, TIBC, IRON, RETICCTPCT in the last 72 hours. Urine analysis:    Component Value Date/Time   COLORURINE STRAW (A) 08/06/2019 1527   APPEARANCEUR CLEAR 08/06/2019 1527   LABSPEC 1.008 08/06/2019 1527   PHURINE 7.0 08/06/2019 1527   GLUCOSEU NEGATIVE 08/06/2019 1527   HGBUR NEGATIVE 08/06/2019 1527   BILIRUBINUR NEGATIVE 08/06/2019 1527   KETONESUR NEGATIVE 08/06/2019 1527   PROTEINUR NEGATIVE 08/06/2019 1527   UROBILINOGEN 0.2 04/10/2014 0745   NITRITE NEGATIVE 08/06/2019 1527   LEUKOCYTESUR NEGATIVE 08/06/2019 1527   Sepsis Labs: !!!!!!!!!!!!!!!!!!!!!!!!!!!!!!!!!!!!!!!!!!!! @LABRCNTIP (procalcitonin:4,lacticidven:4) ) Recent Results (from the past 240 hour(s))  Blood Culture (routine x 2)     Status: None   Collection Time: 07/31/19  5:48 PM   Specimen: BLOOD  Result Value Ref Range Status   Specimen Description   Final    BLOOD LEFT ANTECUBITAL Performed at [facility], [address] [place], [place] [address]    Special Requests   Final    BOTTLES DRAWN AEROBIC AND ANAEROBIC Blood Culture adequate volume Performed at [facility], [address] [place], [place] [address]    Culture   Final    NO GROWTH 5 DAYS Performed at [facility], [address] [place], [place] [address]    Report Status 08/05/2019 FINAL  Final  Blood Culture (routine x 2)     Status: None   Collection Time: 07/31/19  5:57 PM   Specimen: BLOOD  Result Value Ref Range Status   Specimen Description   Final    BLOOD RIGHT ANTECUBITAL Performed at [facility], [address] [place], [place] [address]    Special Requests   Final    BOTTLES DRAWN AEROBIC AND ANAEROBIC Blood Culture adequate volume  Performed at [facility], [address] [place], [place] [address]    Culture   Final    NO GROWTH 5 DAYS Performed at [facility], [address] [place], [place] [address]    Report Status 08/05/2019 FINAL  Final  Urine culture     Status: Abnormal   Collection Time: 08/06/19  3:27 PM   Specimen: Urine, Random  Result Value Ref Range Status   Specimen Description URINE, RANDOM  Final   Special Requests NONE  Final   Culture (A)  Final    <10,000 COLONIES/mL INSIGNIFICANT GROWTH Performed at [facility], [address], [place], [place] [address]    Report Status 08/07/2019 FINAL  Final     Radiological Exams on Admission: CT Head Wo Contrast  Result Date: 08/07/2019 CLINICAL DATA:  Altered mental status, COVID-19 diagnosis 1.5 weeks prior with admission for delirium on 08/06/2019 EXAM: CT HEAD WITHOUT CONTRAST TECHNIQUE: Contiguous axial images were obtained from the base of the skull through the vertex without intravenous contrast. COMPARISON:  CT head 08/06/2019, 05/04/2016 FINDINGS: Brain: Image quality is degraded due to patient motion. Redemonstration of a region of gliosis in the left occipital lobe which is unchanged from 1 day prior but new since 2017 comparisons. Overall finding favors a remote interval infarct but remains grossly age indeterminate. Prior lacunar infarcts are noted in the bilateral basal ganglia as is some stable senescent mineralization. No other sites concerning for acute infarction. No visible hemorrhage, hydrocephalus, extra-axial collection or mass lesion/mass effect. Symmetric moderate prominence of the ventricles, cisterns and sulci compatible with parenchymal volume loss. Confluent areas of white matter hypoattenuation are most compatible with chronic microvascular angiopathy. Vascular:  Atherosclerotic calcification of the carotid siphons and intradural vertebral arteries. No hyperdense vessel. Skull: No calvarial fracture or suspicious osseous lesion. No scalp swelling or hematoma. Sinuses/Orbits: Paranasal sinuses and mastoid air cells are predominantly clear. Debris in the left external auditory canal. Orbital structures are unremarkable aside from prior lens extractions. Other: None IMPRESSION: Image quality degraded due to motion artifact. Redemonstration of a region of gliosis in the left occipital lobe which is unchanged from 1 day prior but new since 2017 comparisons. Overall finding favors a remote interval infarct but remains grossly age indeterminate. If there is concern that this may be subacute, consider MR for further evaluation though only if patient is able to remain still for the procedure. Remote bilateral basal ganglia lacunar infarcts. Extensive chronic microvascular angiopathy and moderate parenchymal volume loss, similar to priors. Intracranial atherosclerosis. Debris in the left external auditory canal, correlate for cerumen impaction. Electronically Signed   By: Kreg Shropshire M.D.   On: 08/07/2019 23:41   CT Head Wo Contrast  Result Date: 08/06/2019 CLINICAL DATA:  Encephalopathy EXAM: CT HEAD WITHOUT CONTRAST CT CERVICAL SPINE WITHOUT CONTRAST TECHNIQUE: Multidetector CT imaging of the head and cervical spine was performed following the standard protocol without intravenous contrast. Multiplanar CT image reconstructions of the cervical spine were also generated. COMPARISON:  CT 05/04/2016 FINDINGS: CT HEAD FINDINGS Brain: Motion degradation of the exam. Repeat exam with some improvement. Hypodensities within the LEFT and RIGHT external capsule is consistent remote white matter infarctions. Focal encephalomalacia within the LEFT occipital lobe is new from comparison exam 2017 (image 13/30. No clear acute cortical Concern cortical atrophy. Generalized subcortical white  matter hypodensities. No extra-axial collections Vascular: No hyperdense vessel or unexpected calcification. Skull: Normal. Negative for fracture or focal lesion. Sinuses/Orbits: Paranasal sinuses  and mastoid air cells are clear. Orbits are clear. Other: None. CT CERVICAL SPINE FINDINGS Alignment: Normal alignment of the cervical vertebral bodies. Skull base and vertebrae: Normal craniocervical junction. No loss of vertebral body height or disc height. Normal facet articulation. No evidence of fracture. Soft tissues and spinal canal: No prevertebral soft tissue swelling. No perispinal or epidural hematoma. Disc levels: Endplate spurring and disc space narrowing from C4 to the C7. No acute findings Upper chest: Clear Other: None IMPRESSION: 1. No acute intracranial findings. 2. Remote deep white matter external capsule infarctions. 3. Probable remote LEFT occipital lobe infarction. 4. Generalized atrophy and white matter microvascular disease. 5. No cervical spine fracture. 6. Multilevel disc osteophytic disease. Electronically Signed   By: Genevive Bi M.D.   On: 08/06/2019 14:41   CT Cervical Spine Wo Contrast  Result Date: 08/06/2019 CLINICAL DATA:  Encephalopathy EXAM: CT HEAD WITHOUT CONTRAST CT CERVICAL SPINE WITHOUT CONTRAST TECHNIQUE: Multidetector CT imaging of the head and cervical spine was performed following the standard protocol without intravenous contrast. Multiplanar CT image reconstructions of the cervical spine were also generated. COMPARISON:  CT 05/04/2016 FINDINGS: CT HEAD FINDINGS Brain: Motion degradation of the exam. Repeat exam with some improvement. Hypodensities within the LEFT and RIGHT external capsule is consistent remote white matter infarctions. Focal encephalomalacia within the LEFT occipital lobe is new from comparison exam 2017 (image 13/30. No clear acute cortical Concern cortical atrophy. Generalized subcortical white matter hypodensities. No extra-axial collections  Vascular: No hyperdense vessel or unexpected calcification. Skull: Normal. Negative for fracture or focal lesion. Sinuses/Orbits: Paranasal sinuses and mastoid air cells are clear. Orbits are clear. Other: None. CT CERVICAL SPINE FINDINGS Alignment: Normal alignment of the cervical vertebral bodies. Skull base and vertebrae: Normal craniocervical junction. No loss of vertebral body height or disc height. Normal facet articulation. No evidence of fracture. Soft tissues and spinal canal: No prevertebral soft tissue swelling. No perispinal or epidural hematoma. Disc levels: Endplate spurring and disc space narrowing from C4 to the C7. No acute findings Upper chest: Clear Other: None IMPRESSION: 1. No acute intracranial findings. 2. Remote deep white matter external capsule infarctions. 3. Probable remote LEFT occipital lobe infarction. 4. Generalized atrophy and white matter microvascular disease. 5. No cervical spine fracture. 6. Multilevel disc osteophytic disease. Electronically Signed   By: Genevive Bi M.D.   On: 08/06/2019 14:41   Old chart reviewed Case discussed with EDP   Assessment/Plan 86 year old female with recent Covid infection status post remdesivir and Decadron presents with failure to thrive continue decreased p.o. intake and acute kidney injury due to dehydration Principal Problem:   AKI (acute kidney injury) (HCC)-creatinine has been slowly worsening likely secondary to decreased p.o. intake.  Give IV fluids.  Creatinine bumped up to 1.9.  Repeat labs in the morning.  Potassium normal.  Active Problems:   Acute hypoxemic respiratory failure due to COVID-19 (HCC)-seems to have resolved.  Currently O2 sats normal on room air    Memory deficit-noted    FTT (failure to thrive) in adult-as above     DVT prophylaxis: SCDs Code Status: DNR Family Communication: None Disposition Plan: 1 to 3 days Consults called: None Admission status: Observation   Leilynn Pilat A MD [facility]  If 7PM-7AM, please contact night-coverage [email or web address] Password [email or web address]  08/08/2019, 1:05 AM

## 2019-08-08 NOTE — Progress Notes (Signed)
Patient received order for tele sitter from MD at 1245. Just gave report to tele sitter tech who will call me when tele sitter camera is turned on in room. Patient currently just fell asleep.

## 2019-08-08 NOTE — Progress Notes (Signed)
PROGRESS NOTE                                                                                                                                                                                                             Patient Demographics:    [name], is a 86 y.o. female, DOB - [birth year], KDT:[id number]  Admit date - 08/07/2019   Admitting Physician Haydee Monica, MD  Outpatient Primary MD for the patient is Burton Apley, MD  LOS - 0   Chief Complaint  Patient presents with  . Fall       Brief Narrative    This is a no charge note as patient was seen and admitted earlier today by Dr. Zada Girt is a 86 y.o. female with medical history significant of dementia, hypertension, prior CVA sent in from her nursing home because of frequent falls and not eating and drinking very well.  Patient cannot provide any history secondary to her dementia.  She has been more confused than normal.  Today patient found to have acute kidney injury.  She is recently had Covid and was treated with remdesivir and Decadron discharged on August 05, 2019.  Patient today found to have acute kidney injury with a creatinine bump up to 1.8.  O2 sats are normal on room air.  Patient be referred for admission for dehydration in the setting of continued weakness after Covid infection.   Subjective:    [name] today pleasantly confused, cannot provide any reliable complaints, but reports she is cold and asking if she can be covered with more blankets which I have done.  Assessment  & Plan :    Principal Problem:   AKI (acute kidney injury) (HCC) Active Problems:   Memory deficit   Acute hypoxemic respiratory failure due to COVID-19 (HCC)   FTT (failure to thrive) in adult   AKI/failure to thrive -This is most likely due to volume depletion/dehydration, from poor oral intake and failure to thrive post Covid infection. -Patient remains  clinically dehydrated with a dry tongue and delayed skin turgor, continue with IV fluid at current rate.  We will need to monitor closely for volume overload, given her known heart failure with EF 20 to 25%. -Will monitor phosphorus and magnesium and replete as needed. -Will start on Ensure -Will  consult PT/OT  COVID  19 infection -Patient currently with no hypoxia, she already finished her treatment with steroids and remdesivir last admission.  Chronic systolic/diastolic CHF -Currently appears to be dry, but will need to monitor closely given her history of CHF with known EF 20 to 25% in 2012.  Hypothyroidism -Continue with Synthroid  History of dementia -Appears to be worsening due hospital delirium, to get out of bed, have ordered telemetry sitter.  History of CVA -Continue with a statin  COVID-19 Labs  No results for input(s): DDIMER, FERRITIN, LDH, CRP in the last 72 hours.  No results found for: SARSCOV2NAA   Code Status : DNR  Disposition Plan  : Back to SNF  Barriers For Discharge : AKI, remains on IV fluid, oral intake remains poor, as well mentation back to baseline  Consults  :  none  Procedures  : none  DVT Prophylaxis  :   Lab Results  Component Value Date   PLT 185 08/07/2019    Antibiotics  :    Anti-infectives (From admission, onward)   None        Objective:   Vitals:   08/08/19 0400 08/08/19 0518 08/08/19 0541 08/08/19 0726  BP: 121/74 131/74 131/74 (!) 112/56  Pulse: 67 64 72 73  Resp: 20 17 16 18   Temp:  97.8 F (36.6 C) 97.8 F (36.6 C) 98 F (36.7 C)  TempSrc:   Oral Axillary  SpO2: 92% 98%  96%  Weight:   52.2 kg   Height:   5\' 6"  (1.676 m)     Wt Readings from Last 3 Encounters:  08/08/19 52.2 kg  07/31/19 55.8 kg  09/16/18 56.7 kg    No intake or output data in the 24 hours ending 08/08/19 1322   Physical Exam  Awake Alert, extremely frail, laying in bed, build in her blanket, in no apparent distress  Dry oral  mucosa, with delayed skin turgor  Symmetrical Chest wall movement, Good air movement bilaterally, CTAB RRR,No Gallops,Rubs or new Murmurs, No Parasternal Heave +ve B.Sounds, Abd Soft, No tenderness,  No rebound - guarding or rigidity. No Cyanosis, Clubbing or edema, No new Rash or bruise     Data Review:    CBC Recent Labs  Lab 08/03/19 0604 08/04/19 0319 08/05/19 0340 08/06/19 1323 08/07/19 2254  WBC 8.9 6.7 7.3 9.7 11.0*  HGB 10.5* 10.9* 11.5* 13.5 13.5  HCT 33.7* 33.6* 35.2* 41.8 42.6  PLT 124* 142* 153 178 185  MCV 98.0 94.1 93.6 93.7 95.3  MCH 30.5 30.5 30.6 30.3 30.2  MCHC 31.2 32.4 32.7 32.3 31.7  RDW 14.0 13.9 13.6 13.4 13.6  LYMPHSABS 3.0 2.8 3.4 3.5 4.5*  MONOABS 0.7 0.6 0.7 0.7 0.9  EOSABS 0.0 0.0 0.0 0.0 0.1  BASOSABS 0.0 0.0 0.0 0.0 0.1    Chemistries  Recent Labs  Lab 08/03/19 0604 08/04/19 0319 08/05/19 0340 08/06/19 1323 08/07/19 2254  NA 138 141 140 145 143  K 4.8 4.5 4.2 4.1 4.2  CL 104 105 106 109 106  CO2 25 26 26 26 23   GLUCOSE 119* 124* 125* 96 120*  BUN 59* 52* 46* 42* 54*  CREATININE 1.33* 1.29* 1.21* 1.45* 1.82*  CALCIUM 8.2* 8.5* 8.6* 9.2 9.2  AST 33 33 28 32 22  ALT 20 22 22 25 21   ALKPHOS 37* 39 40 45 49  BILITOT 1.1 1.2 1.6* 1.9* 2.2*   ------------------------------------------------------------------------------------------------------------------ No results for input(s): CHOL, HDL, LDLCALC, TRIG, CHOLHDL, LDLDIRECT in  the last 72 hours.  Lab Results  Component Value Date   HGBA1C  12/20/2007    5.7 (NOTE)   The ADA recommends the following therapeutic goals for glycemic   control related to Hgb A1C measurement:   Goal of Therapy:   < 7.0% Hgb A1C   Action Suggested:  > 8.0% Hgb A1C   Ref:  Diabetes Care, 22, Suppl. 1, 1999   ------------------------------------------------------------------------------------------------------------------ No results for input(s): TSH, T4TOTAL, T3FREE, THYROIDAB in the last 72  hours.  Invalid input(s): FREET3 ------------------------------------------------------------------------------------------------------------------ No results for input(s): VITAMINB12, FOLATE, FERRITIN, TIBC, IRON, RETICCTPCT in the last 72 hours.  Coagulation profile No results for input(s): INR, PROTIME in the last 168 hours.  No results for input(s): DDIMER in the last 72 hours.  Cardiac Enzymes No results for input(s): CKMB, TROPONINI, MYOGLOBIN in the last 168 hours.  Invalid input(s): CK ------------------------------------------------------------------------------------------------------------------    Component Value Date/Time   BNP 58.7 08/02/2019 0220   BNP 33.5 09/16/2015 1139    Inpatient Medications  Scheduled Meds: . vitamin C  500 mg Oral Daily  . heparin  5,000 Units Subcutaneous Q8H  . sodium chloride flush  3 mL Intravenous Q12H  . zinc sulfate  220 mg Oral Daily   Continuous Infusions: . sodium chloride     PRN Meds:.sodium chloride, sodium chloride flush  Micro Results Recent Results (from the past 240 hour(s))  Blood Culture (routine x 2)     Status: None   Collection Time: 07/31/19  5:48 PM   Specimen: BLOOD  Result Value Ref Range Status   Specimen Description   Final    BLOOD LEFT ANTECUBITAL Performed at [facility], [address], [place], [place] [address]    Special Requests   Final    BOTTLES DRAWN AEROBIC AND ANAEROBIC Blood Culture adequate volume Performed at [facility], [address] [place], [place] [address]    Culture   Final    NO GROWTH 5 DAYS Performed at [facility], [address] [place], [place] [address]    Report Status 08/05/2019 FINAL  Final  Blood Culture (routine x 2)     Status: None   Collection Time: 07/31/19  5:57 PM   Specimen: BLOOD  Result Value Ref Range Status   Specimen Description   Final    BLOOD RIGHT ANTECUBITAL Performed at [facility], [address] [place], [place] [address]    Special Requests   Final    BOTTLES DRAWN AEROBIC AND ANAEROBIC Blood Culture adequate volume Performed at [facility], [address] [place], [place] [address]    Culture   Final    NO GROWTH 5 DAYS Performed at [facility], [address] [place], [place] [address]    Report Status 08/05/2019 FINAL  Final  Urine culture     Status: Abnormal   Collection Time: 08/06/19  3:27 PM   Specimen: Urine, Random  Result Value Ref Range Status   Specimen Description URINE, RANDOM  Final   Special Requests NONE  Final   Culture (A)  Final    <10,000 COLONIES/mL INSIGNIFICANT GROWTH Performed at [facility], [address], [place], [place] [address]    Report Status 08/07/2019 FINAL  Final    Radiology Reports CT Head Wo Contrast  Result Date: 08/07/2019 CLINICAL DATA:  Altered mental status, COVID-19 diagnosis 1.5 weeks prior with admission for delirium on 08/06/2019 EXAM:  CT HEAD WITHOUT CONTRAST TECHNIQUE: Contiguous axial images were obtained from the base of the skull through the vertex without intravenous contrast. COMPARISON:  CT head 08/06/2019, 05/04/2016 FINDINGS: Brain: Image quality is degraded due to patient motion. Redemonstration of a region of gliosis in the left occipital lobe which is unchanged from 1 day prior but new since 2017 comparisons. Overall finding favors a remote interval infarct but remains grossly age indeterminate. Prior lacunar infarcts are noted in the bilateral basal ganglia as is some stable senescent mineralization. No other sites concerning for acute infarction. No visible hemorrhage, hydrocephalus, extra-axial collection or mass lesion/mass effect. Symmetric moderate prominence of the ventricles, cisterns and sulci compatible with parenchymal volume loss. Confluent areas of white matter hypoattenuation are most compatible with chronic microvascular  angiopathy. Vascular: Atherosclerotic calcification of the carotid siphons and intradural vertebral arteries. No hyperdense vessel. Skull: No calvarial fracture or suspicious osseous lesion. No scalp swelling or hematoma. Sinuses/Orbits: Paranasal sinuses and mastoid air cells are predominantly clear. Debris in the left external auditory canal. Orbital structures are unremarkable aside from prior lens extractions. Other: None IMPRESSION: Image quality degraded due to motion artifact. Redemonstration of a region of gliosis in the left occipital lobe which is unchanged from 1 day prior but new since 2017 comparisons. Overall finding favors a remote interval infarct but remains grossly age indeterminate. If there is concern that this may be subacute, consider MR for further evaluation though only if patient is able to remain still for the procedure. Remote bilateral basal ganglia lacunar infarcts. Extensive chronic microvascular angiopathy and moderate parenchymal volume loss, similar to priors. Intracranial atherosclerosis. Debris in the left external auditory canal, correlate for cerumen impaction. Electronically Signed   By: Kreg Shropshire M.D.   On: 08/07/2019 23:41   CT Head Wo Contrast  Result Date: 08/06/2019 CLINICAL DATA:  Encephalopathy EXAM: CT HEAD WITHOUT CONTRAST CT CERVICAL SPINE WITHOUT CONTRAST TECHNIQUE: Multidetector CT imaging of the head and cervical spine was performed following the standard protocol without intravenous contrast. Multiplanar CT image reconstructions of the cervical spine were also generated. COMPARISON:  CT 05/04/2016 FINDINGS: CT HEAD FINDINGS Brain: Motion degradation of the exam. Repeat exam with some improvement. Hypodensities within the LEFT and RIGHT external capsule is consistent remote white matter infarctions. Focal encephalomalacia within the LEFT occipital lobe is new from comparison exam 2017 (image 13/30. No clear acute cortical Concern cortical atrophy. Generalized  subcortical white matter hypodensities. No extra-axial collections Vascular: No hyperdense vessel or unexpected calcification. Skull: Normal. Negative for fracture or focal lesion. Sinuses/Orbits: Paranasal sinuses and mastoid air cells are clear. Orbits are clear. Other: None. CT CERVICAL SPINE FINDINGS Alignment: Normal alignment of the cervical vertebral bodies. Skull base and vertebrae: Normal craniocervical junction. No loss of vertebral body height or disc height. Normal facet articulation. No evidence of fracture. Soft tissues and spinal canal: No prevertebral soft tissue swelling. No perispinal or epidural hematoma. Disc levels: Endplate spurring and disc space narrowing from C4 to the C7. No acute findings Upper chest: Clear Other: None IMPRESSION: 1. No acute intracranial findings. 2. Remote deep white matter external capsule infarctions. 3. Probable remote LEFT occipital lobe infarction. 4. Generalized atrophy and white matter microvascular disease. 5. No cervical spine fracture. 6. Multilevel disc osteophytic disease. Electronically Signed   By: Genevive Bi M.D.   On: 08/06/2019 14:41   CT Cervical Spine Wo Contrast  Result Date: 08/06/2019 CLINICAL DATA:  Encephalopathy EXAM: CT HEAD WITHOUT CONTRAST CT CERVICAL SPINE WITHOUT CONTRAST TECHNIQUE:  Multidetector CT imaging of the head and cervical spine was performed following the standard protocol without intravenous contrast. Multiplanar CT image reconstructions of the cervical spine were also generated. COMPARISON:  CT 05/04/2016 FINDINGS: CT HEAD FINDINGS Brain: Motion degradation of the exam. Repeat exam with some improvement. Hypodensities within the LEFT and RIGHT external capsule is consistent remote white matter infarctions. Focal encephalomalacia within the LEFT occipital lobe is new from comparison exam 2017 (image 13/30. No clear acute cortical Concern cortical atrophy. Generalized subcortical white matter hypodensities. No extra-axial  collections Vascular: No hyperdense vessel or unexpected calcification. Skull: Normal. Negative for fracture or focal lesion. Sinuses/Orbits: Paranasal sinuses and mastoid air cells are clear. Orbits are clear. Other: None. CT CERVICAL SPINE FINDINGS Alignment: Normal alignment of the cervical vertebral bodies. Skull base and vertebrae: Normal craniocervical junction. No loss of vertebral body height or disc height. Normal facet articulation. No evidence of fracture. Soft tissues and spinal canal: No prevertebral soft tissue swelling. No perispinal or epidural hematoma. Disc levels: Endplate spurring and disc space narrowing from C4 to the C7. No acute findings Upper chest: Clear Other: None IMPRESSION: 1. No acute intracranial findings. 2. Remote deep white matter external capsule infarctions. 3. Probable remote LEFT occipital lobe infarction. 4. Generalized atrophy and white matter microvascular disease. 5. No cervical spine fracture. 6. Multilevel disc osteophytic disease. Electronically Signed   By: Genevive BiStewart  Edmunds M.D.   On: 08/06/2019 14:41   DG Chest Port 1 View  Result Date: 07/31/2019 CLINICAL DATA:  Diagnosed as COVID-19 positive on 07/28/2019, increased weakness, decreased appetite, dehydration for 3 days, history Alzheimer's, stroke, coronary artery disease post MI, CHF, ischemic cardiomyopathy, hypertension EXAM: PORTABLE CHEST 1 VIEW COMPARISON:  Portable exam 1722 hours compared to 11/10/2014 FINDINGS: Upper normal heart size. Mediastinal contours and pulmonary vascularity normal. Atherosclerotic calcification aorta. Patchy infiltrates identified LEFT upper lobe and RIGHT base consistent with multifocal pneumonia. No pleural effusion or pneumothorax. Bones demineralized. IMPRESSION: Patchy infiltrates LEFT upper lobe and RIGHT base consistent with multifocal pneumonia. Electronically Signed   By: Ulyses SouthwardMark  Boles M.D.   On: 07/31/2019 17:59     Huey Bienenstockawood Kelvyn Schunk M.D on 08/08/2019 at 1:22  PM  Between 7am to 7pm - Pager - [phone]  After 7pm go to [email or web address] - password [email or web address]  [facility] -  Office  [phone]

## 2019-08-08 NOTE — ED Notes (Signed)
PTAR at bedside 

## 2019-08-08 NOTE — Plan of Care (Signed)
  Problem: Coping: Goal: Psychosocial and spiritual needs will be supported Outcome: Progressing   Problem: Respiratory: Goal: Will maintain a patent airway Outcome: Progressing Goal: Complications related to the disease process, condition or treatment will be avoided or minimized Outcome: Progressing   Problem: Clinical Measurements: Goal: Ability to maintain clinical measurements within normal limits will improve Outcome: Progressing Goal: Will remain free from infection Outcome: Progressing Goal: Diagnostic test results will improve Outcome: Progressing Goal: Respiratory complications will improve Outcome: Progressing Goal: Cardiovascular complication will be avoided Outcome: Progressing   Problem: Activity: Goal: Risk for activity intolerance will decrease Outcome: Progressing   Problem: Nutrition: Goal: Adequate nutrition will be maintained Outcome: Progressing   Problem: Coping: Goal: Level of anxiety will decrease Outcome: Progressing   Problem: Elimination: Goal: Will not experience complications related to bowel motility Outcome: Progressing Goal: Will not experience complications related to urinary retention Outcome: Progressing   Problem: Pain Managment: Goal: General experience of comfort will improve Outcome: Progressing   Problem: Safety: Goal: Ability to remain free from injury will improve Outcome: Progressing   Problem: Skin Integrity: Goal: Risk for impaired skin integrity will decrease Outcome: Progressing   Problem: Education: Goal: Knowledge of risk factors and measures for prevention of condition will improve Outcome: Not Progressing   Problem: Education: Goal: Knowledge of General Education information will improve Description: Including pain rating scale, medication(s)/side effects and non-pharmacologic comfort measures Outcome: Not Progressing   Problem: Health Behavior/Discharge Planning: Goal: Ability to manage health-related  needs will improve Outcome: Not Progressing

## 2019-08-08 NOTE — ED Notes (Signed)
ED TO INPATIENT HANDOFF REPORT  Name/Age/Gender [name] 86 y.o. female  Code Status    Code Status Orders  (From admission, onward)         Start     Ordered   08/08/19 0102  Do not attempt resuscitation (DNR)  Continuous    Question Answer Comment  In the event of cardiac or respiratory ARREST Do not call a "code blue"   In the event of cardiac or respiratory ARREST Do not perform Intubation, CPR, defibrillation or ACLS   In the event of cardiac or respiratory ARREST Use medication by any route, position, wound care, and other measures to relive pain and suffering. May use oxygen, suction and manual treatment of airway obstruction as needed for comfort.      08/08/19 0103        Code Status History    Date Active Date Inactive Code Status Order ID Comments User Context   07/31/2019 1956 08/06/2019 0218 DNR [id number]  Hillary Bow, DO ED   04/20/2017 1737 04/24/2017 2136 DNR [id number]  Zigmund Daniel., MD Inpatient   Advance Care Planning Activity      Home/SNF/Other Nursing Home  Chief Complaint AKI (acute kidney injury) [facility]) [N17.9]  Level of Care/Admitting Diagnosis ED Disposition    ED Disposition Condition Comment   Admit  Hospital Area: [facility] [[id number]]  Level of Care: Med-Surg [16]  Covid Evaluation: Confirmed COVID Positive  Diagnosis: AKI (acute kidney injury) [facility]) [[id number]]  Admitting Physician: Haydee Monica [[id number]]  Attending Physician: Haydee Monica [[id number]]       Medical History Past Medical History:  Diagnosis Date  . Advanced age   . Cardiac arrest [facility]) 2007   Occurred during planned ovarian surgery that resolved spontaneously after her stomach was deflated.   . CVA (cerebral infarction) Oct 2010   "light stroke". Plavix started.  . Grief    from husband's death  . Hyperlipemia   . Hypertension   . Hypothyroidism   . Ischemic cardiomyopathy June 2009   managed medically; EF is 20 to 25%  .  Left bundle branch block   . Left heart failure (HCC)    EF is 20 to 25%; She is managed conservatively  . Stroke (HCC)   . Subendocardial myocardial infarction [facility]) June 2009    Allergies Allergies  Allergen Reactions  . Tape Other (See Comments)    SKIN IS THIN- WILL TEAR EASILY!!!!!  . Novocain [Procaine] Other (See Comments)    "I passed out"  . Procaine Hcl Other (See Comments) and Hypertension    "PASSED OUT"    IV Location/Drains/Wounds Patient Lines/Drains/Airways Status   Active Line/Drains/Airways    Name:   Placement date:   Placement time:   Site:   Days:   Peripheral IV 08/02/19 Anterior;Distal;Right Wrist   08/02/19    0135    Wrist   6   Peripheral IV 08/07/19 Left Forearm   08/07/19    --    Forearm   1   Incision (Closed) 04/21/17 Wrist Left   04/21/17    0930     839          Labs/Imaging Results for orders placed or performed during the hospital encounter of 08/07/19 (from the past 48 hour(s))  CBC with Differential     Status: Abnormal   Collection Time: 08/07/19 10:54 PM  Result Value Ref Range   WBC 11.0 (H) 4.0 -  10.5 K/uL   RBC 4.47 3.87 - 5.11 MIL/uL   Hemoglobin 13.5 12.0 - 15.0 g/dL   HCT 02.5 85.2 - 77.8 %   MCV 95.3 80.0 - 100.0 fL   MCH 30.2 26.0 - 34.0 pg   MCHC 31.7 30.0 - 36.0 g/dL   RDW 24.2 35.3 - 61.4 %   Platelets 185 150 - 400 K/uL   nRBC 0.0 0.0 - 0.2 %   Neutrophils Relative % 47 %   Neutro Abs 5.2 1.7 - 7.7 K/uL   Lymphocytes Relative 41 %   Lymphs Abs 4.5 (H) 0.7 - 4.0 K/uL   Monocytes Relative 8 %   Monocytes Absolute 0.9 0.1 - 1.0 K/uL   Eosinophils Relative 1 %   Eosinophils Absolute 0.1 0.0 - 0.5 K/uL   Basophils Relative 1 %   Basophils Absolute 0.1 0.0 - 0.1 K/uL   WBC Morphology MILD LEFT SHIFT (1-5% METAS, OCC MYELO, OCC BANDS)    Immature Granulocytes 2 %   Abs Immature Granulocytes 0.19 (H) 0.00 - 0.07 K/uL    Comment: Performed at [facility], [address] [place], [place] [address]   Comprehensive metabolic panel     Status: Abnormal   Collection Time: 08/07/19 10:54 PM  Result Value Ref Range   Sodium 143 135 - 145 mmol/L   Potassium 4.2 3.5 - 5.1 mmol/L   Chloride 106 98 - 111 mmol/L   CO2 23 22 - 32 mmol/L   Glucose, Bld 120 (H) 70 - 99 mg/dL   BUN 54 (H) 8 - 23 mg/dL   Creatinine, Ser 0.08 (H) 0.44 - 1.00 mg/dL   Calcium 9.2 8.9 - 67.6 mg/dL   Total Protein 6.4 (L) 6.5 - 8.1 g/dL   Albumin 3.9 3.5 - 5.0 g/dL   AST 22 15 - 41 U/L   ALT 21 0 - 44 U/L   Alkaline Phosphatase 49 38 - 126 U/L   Total Bilirubin 2.2 (H) 0.3 - 1.2 mg/dL   GFR calc non Af Amer 22 (L) >60 mL/min   GFR calc Af Amer 26 (L) >60 mL/min   Anion gap 14 5 - 15    Comment: Performed at [facility], [address] [place], [place] [address]  [place]     Status: None   Collection Time: 08/07/19 10:54 PM  Result Value Ref Range   Total CK 42 38 - 234 U/L    Comment: Performed at [facility], [address] [place] [address] [place], [place] [address]   CT Head Wo Contrast  Result Date: 08/07/2019 CLINICAL DATA:  Altered mental status, COVID-19 diagnosis 1.5 weeks prior with admission for delirium on 08/06/2019 EXAM: CT HEAD WITHOUT CONTRAST TECHNIQUE: Contiguous axial images were obtained from the base of the skull through the vertex without intravenous contrast. COMPARISON:  CT head 08/06/2019, 05/04/2016 FINDINGS: Brain: Image quality is degraded due to patient motion. Redemonstration of a region of gliosis in the left occipital lobe which is unchanged from 1 day prior but new since 2017 comparisons. Overall finding favors a remote interval infarct but remains grossly age indeterminate. Prior lacunar infarcts are noted in the bilateral basal ganglia as is some stable senescent mineralization. No other sites concerning for acute infarction. No visible hemorrhage, hydrocephalus, extra-axial collection or mass lesion/mass effect. Symmetric moderate prominence of the ventricles,  cisterns and sulci compatible with parenchymal volume loss. Confluent areas of white matter hypoattenuation are most compatible with chronic microvascular angiopathy. Vascular: Atherosclerotic  calcification of the carotid siphons and intradural vertebral arteries. No hyperdense vessel. Skull: No calvarial fracture or suspicious osseous lesion. No scalp swelling or hematoma. Sinuses/Orbits: Paranasal sinuses and mastoid air cells are predominantly clear. Debris in the left external auditory canal. Orbital structures are unremarkable aside from prior lens extractions. Other: None IMPRESSION: Image quality degraded due to motion artifact. Redemonstration of a region of gliosis in the left occipital lobe which is unchanged from 1 day prior but new since 2017 comparisons. Overall finding favors a remote interval infarct but remains grossly age indeterminate. If there is concern that this may be subacute, consider MR for further evaluation though only if patient is able to remain still for the procedure. Remote bilateral basal ganglia lacunar infarcts. Extensive chronic microvascular angiopathy and moderate parenchymal volume loss, similar to priors. Intracranial atherosclerosis. Debris in the left external auditory canal, correlate for cerumen impaction. Electronically Signed   By: Lovena Le M.D.   On: 08/07/2019 23:41   CT Head Wo Contrast  Result Date: 08/06/2019 CLINICAL DATA:  Encephalopathy EXAM: CT HEAD WITHOUT CONTRAST CT CERVICAL SPINE WITHOUT CONTRAST TECHNIQUE: Multidetector CT imaging of the head and cervical spine was performed following the standard protocol without intravenous contrast. Multiplanar CT image reconstructions of the cervical spine were also generated. COMPARISON:  CT 05/04/2016 FINDINGS: CT HEAD FINDINGS Brain: Motion degradation of the exam. Repeat exam with some improvement. Hypodensities within the LEFT and RIGHT external capsule is consistent remote white matter infarctions. Focal  encephalomalacia within the LEFT occipital lobe is new from comparison exam 2017 (image 13/30. No clear acute cortical Concern cortical atrophy. Generalized subcortical white matter hypodensities. No extra-axial collections Vascular: No hyperdense vessel or unexpected calcification. Skull: Normal. Negative for fracture or focal lesion. Sinuses/Orbits: Paranasal sinuses and mastoid air cells are clear. Orbits are clear. Other: None. CT CERVICAL SPINE FINDINGS Alignment: Normal alignment of the cervical vertebral bodies. Skull base and vertebrae: Normal craniocervical junction. No loss of vertebral body height or disc height. Normal facet articulation. No evidence of fracture. Soft tissues and spinal canal: No prevertebral soft tissue swelling. No perispinal or epidural hematoma. Disc levels: Endplate spurring and disc space narrowing from C4 to the C7. No acute findings Upper chest: Clear Other: None IMPRESSION: 1. No acute intracranial findings. 2. Remote deep white matter external capsule infarctions. 3. Probable remote LEFT occipital lobe infarction. 4. Generalized atrophy and white matter microvascular disease. 5. No cervical spine fracture. 6. Multilevel disc osteophytic disease. Electronically Signed   By: Suzy Bouchard M.D.   On: 08/06/2019 14:41   CT Cervical Spine Wo Contrast  Result Date: 08/06/2019 CLINICAL DATA:  Encephalopathy EXAM: CT HEAD WITHOUT CONTRAST CT CERVICAL SPINE WITHOUT CONTRAST TECHNIQUE: Multidetector CT imaging of the head and cervical spine was performed following the standard protocol without intravenous contrast. Multiplanar CT image reconstructions of the cervical spine were also generated. COMPARISON:  CT 05/04/2016 FINDINGS: CT HEAD FINDINGS Brain: Motion degradation of the exam. Repeat exam with some improvement. Hypodensities within the LEFT and RIGHT external capsule is consistent remote white matter infarctions. Focal encephalomalacia within the LEFT occipital lobe is new  from comparison exam 2017 (image 13/30. No clear acute cortical Concern cortical atrophy. Generalized subcortical white matter hypodensities. No extra-axial collections Vascular: No hyperdense vessel or unexpected calcification. Skull: Normal. Negative for fracture or focal lesion. Sinuses/Orbits: Paranasal sinuses and mastoid air cells are clear. Orbits are clear. Other: None. CT CERVICAL SPINE FINDINGS Alignment: Normal alignment of the cervical vertebral bodies. Skull base  and vertebrae: Normal craniocervical junction. No loss of vertebral body height or disc height. Normal facet articulation. No evidence of fracture. Soft tissues and spinal canal: No prevertebral soft tissue swelling. No perispinal or epidural hematoma. Disc levels: Endplate spurring and disc space narrowing from C4 to the C7. No acute findings Upper chest: Clear Other: None IMPRESSION: 1. No acute intracranial findings. 2. Remote deep white matter external capsule infarctions. 3. Probable remote LEFT occipital lobe infarction. 4. Generalized atrophy and white matter microvascular disease. 5. No cervical spine fracture. 6. Multilevel disc osteophytic disease. Electronically Signed   By: Genevive Bi M.D.   On: 08/06/2019 14:41    Pending Labs Unresulted Labs (From admission, onward)    Start     Ordered   08/09/19 0500  CBC with Differential/Platelet  Daily,   R     08/08/19 0103   08/09/19 0500  Comprehensive metabolic panel  Daily,   R     08/08/19 0103   08/08/19 0208  CBC  (heparin)  Add-on,   AD    Comments: Baseline for heparin therapy IF NOT ALREADY DRAWN.  Notify MD if PLT < 100 K.    08/08/19 0207   08/08/19 0208  Creatinine, serum  (heparin)  Add-on,   AD    Comments: Baseline for heparin therapy IF NOT ALREADY DRAWN.    08/08/19 0207   08/08/19 0102  ABO/Rh  Once,   STAT     08/08/19 0103          Vitals/Pain Today's Vitals   08/08/19 0030 08/08/19 0100 08/08/19 0200 08/08/19 0400  BP: 109/79 132/71  125/89 121/74  Pulse: 66 70 71 67  Resp: 18 18 19 20   Temp:      TempSrc:      SpO2: 91% 90% 90% 92%  Weight:        Isolation Precautions Airborne and Contact precautions  Medications Medications  heparin injection 5,000 Units (has no administration in time range)  sodium chloride flush (NS) 0.9 % injection 3 mL (has no administration in time range)  sodium chloride flush (NS) 0.9 % injection 3 mL (has no administration in time range)  0.9 %  sodium chloride infusion (has no administration in time range)  ascorbic acid (VITAMIN C) tablet 500 mg (has no administration in time range)  zinc sulfate capsule 220 mg (has no administration in time range)  0.9 %  sodium chloride infusion (has no administration in time range)  sodium chloride 0.9 % bolus 500 mL (500 mLs Intravenous New Bag/Given 08/08/19 0035)    Mobility non-ambulatory

## 2019-08-08 NOTE — ED Notes (Signed)
[facility] contacted for transport. Paperwork printed. 

## 2019-08-09 DIAGNOSIS — U071 COVID-19: Secondary | ICD-10-CM

## 2019-08-09 DIAGNOSIS — E860 Dehydration: Secondary | ICD-10-CM

## 2019-08-09 LAB — COMPREHENSIVE METABOLIC PANEL
ALT: 15 U/L (ref 0–44)
AST: 19 U/L (ref 15–41)
Albumin: 3.1 g/dL — ABNORMAL LOW (ref 3.5–5.0)
Alkaline Phosphatase: 37 U/L — ABNORMAL LOW (ref 38–126)
Anion gap: 9 (ref 5–15)
BUN: 50 mg/dL — ABNORMAL HIGH (ref 8–23)
CO2: 23 mmol/L (ref 22–32)
Calcium: 8.3 mg/dL — ABNORMAL LOW (ref 8.9–10.3)
Chloride: 111 mmol/L (ref 98–111)
Creatinine, Ser: 1.47 mg/dL — ABNORMAL HIGH (ref 0.44–1.00)
GFR calc Af Amer: 34 mL/min — ABNORMAL LOW (ref >60)
GFR calc non Af Amer: 29 mL/min — ABNORMAL LOW (ref >60)
Glucose, Bld: 87 mg/dL (ref 70–99)
Potassium: 3.5 mmol/L (ref 3.5–5.1)
Sodium: 143 mmol/L (ref 135–145)
Total Bilirubin: 2.1 mg/dL — ABNORMAL HIGH (ref 0.3–1.2)
Total Protein: 5.2 g/dL — ABNORMAL LOW (ref 6.5–8.1)

## 2019-08-09 LAB — CBC WITH DIFFERENTIAL/PLATELET
Abs Immature Granulocytes: 0.14 10*3/uL — ABNORMAL HIGH (ref 0.00–0.07)
Basophils Absolute: 0 10*3/uL (ref 0.0–0.1)
Basophils Relative: 0 %
Eosinophils Absolute: 0.2 10*3/uL (ref 0.0–0.5)
Eosinophils Relative: 2 %
HCT: 32.9 % — ABNORMAL LOW (ref 36.0–46.0)
Hemoglobin: 10.7 g/dL — ABNORMAL LOW (ref 12.0–15.0)
Immature Granulocytes: 2 %
Lymphocytes Relative: 43 %
Lymphs Abs: 3.2 10*3/uL (ref 0.7–4.0)
MCH: 30.8 pg (ref 26.0–34.0)
MCHC: 32.5 g/dL (ref 30.0–36.0)
MCV: 94.8 fL (ref 80.0–100.0)
Monocytes Absolute: 0.7 10*3/uL (ref 0.1–1.0)
Monocytes Relative: 9 %
Neutro Abs: 3.3 10*3/uL (ref 1.7–7.7)
Neutrophils Relative %: 44 %
Platelets: 132 10*3/uL — ABNORMAL LOW (ref 150–400)
RBC: 3.47 MIL/uL — ABNORMAL LOW (ref 3.87–5.11)
RDW: 13.8 % (ref 11.5–15.5)
WBC: 7.5 10*3/uL (ref 4.0–10.5)
nRBC: 0 % (ref 0.0–0.2)

## 2019-08-09 LAB — MRSA PCR SCREENING: MRSA by PCR: NEGATIVE

## 2019-08-09 MED ORDER — SODIUM CHLORIDE 0.45 % IV SOLN: INTRAVENOUS | Status: DC

## 2019-08-09 MED ORDER — SODIUM CHLORIDE 0.9 % IV SOLN: INTRAVENOUS | Status: AC

## 2019-08-09 NOTE — Progress Notes (Addendum)
PROGRESS NOTE                                                                                                                                                                                                             Patient Demographics:    [name], is a 86 y.o. female, DOB - 24-Oct-1916, FKC:[id number]  Admit date - 08/07/2019   Admitting Physician Phillips Grout, MD  Outpatient Primary MD for the patient is Lorene Dy, MD  LOS - 1   Chief Complaint  Patient presents with   Fall       Brief Narrative     86 y.o. female with medical history significant of dementia, hypertension, prior CVA sent in from her nursing home because of frequent falls and not eating and drinking very well.  Patient cannot provide any history secondary to her dementia.  She has been more confused than normal.  She is recently had Covid and was treated with remdesivir and Decadron discharged on August 05, 2019.  Patient today found to have acute kidney injury with a creatinine bump up to 1.8.  O2 sats are normal on room air.  Patient be referred for admission for dehydration in the setting of continued weakness after Covid infection.   Subjective:    [name] today pleasantly confused, cannot provide any reliable complaints, is no significant events as discussed with staff.   Assessment  & Plan :    Principal Problem:   AKI (acute kidney injury) ([place]) Active Problems:   Memory deficit   Acute hypoxemic respiratory failure due to COVID-19 ([place])   FTT (failure to thrive) in adult   AKI/failure to thrive -This is most likely due to volume depletion/dehydration, from poor oral intake and failure to thrive post Covid infection. -He was treated with IV fluids, her creatinine has significantly improved, BUN remains elevated at 50, so I will continue for another 500 cc fluid, will be very cautious with resuscitation volume given her known heart failure  with EF 20 to 25%. -Discussed with staff, her appetite has started to improve, she was encouraged to drink Ensure  History of dementia -Known baseline of dementia, but she has been more confused, impulsive, this appears to be improving.  COVID  19 infection -Patient currently with no hypoxia, she already finished her treatment with steroids and  remdesivir last admission.  Chronic systolic/diastolic CHF -Currently appears to be dry, but will need to monitor closely given her history of CHF with known EF 20 to 25% in 2012.  Hypothyroidism -Continue with Synthroid  History of CVA -Continue with a statin  COVID-19 Labs  No results for input(s): DDIMER, FERRITIN, LDH, CRP in the last 72 hours.  No results found for: SARSCOV2NAA   Code Status : DNR  Family communication: Left her daughter a voicemail  Disposition Plan  : Back to SNF  Barriers For Discharge : Mental status has been improving, will discontinue her telemetry sitter, hopefully can go back to SNF in 24 hours .she is from [facility], daughter hoping for her to go to memory care at [facility].  Consults  :  none  Procedures  : none  DVT Prophylaxis  : [place] heparin  Lab Results  Component Value Date   PLT 132 (L) 08/09/2019    Antibiotics  :    Anti-infectives (From admission, onward)   None        Objective:   Vitals:   08/09/19 0440 08/09/19 0758 08/09/19 1141 08/09/19 1538  BP: 128/67 114/75  113/67  Pulse: 64 (!) 58 62 64  Resp: 16 16 18    Temp: 97.6 F (36.4 C) (!) 97.4 F (36.3 C) 99 F (37.2 C) 98.1 F (36.7 C)  TempSrc: Oral Oral Oral Axillary  SpO2: 99% 99% 97% 95%  Weight:      Height:        Wt Readings from Last 3 Encounters:  08/08/19 52.2 kg  07/31/19 55.8 kg  09/16/18 56.7 kg     Intake/Output Summary (Last 24 hours) at 08/09/2019 1551 Last data filed at 08/09/2019 0530 Gross per 24 hour  Intake 2365 ml  Output --  Net 2365 ml     Physical  Exam  Awake Alert, extremely frail, in no apparent distress, follows simple commands but does not answer questions appropriately. Symmetrical Chest wall movement, Good air movement bilaterally, CTAB RRR,No Gallops,Rubs or new Murmurs, No Parasternal Heave +ve B.Sounds, Abd Soft, No tenderness, No rebound - guarding or rigidity. No Cyanosis, Clubbing or edema, No new Rash or bruise      Data Review:    CBC Recent Labs  Lab 08/04/19 0319 08/05/19 0340 08/06/19 1323 08/07/19 2254 08/09/19 0218  WBC 6.7 7.3 9.7 11.0* 7.5  HGB 10.9* 11.5* 13.5 13.5 10.7*  HCT 33.6* 35.2* 41.8 42.6 32.9*  PLT 142* 153 178 185 132*  MCV 94.1 93.6 93.7 95.3 94.8  MCH 30.5 30.6 30.3 30.2 30.8  MCHC 32.4 32.7 32.3 31.7 32.5  RDW 13.9 13.6 13.4 13.6 13.8  LYMPHSABS 2.8 3.4 3.5 4.5* 3.2  MONOABS 0.6 0.7 0.7 0.9 0.7  EOSABS 0.0 0.0 0.0 0.1 0.2  BASOSABS 0.0 0.0 0.0 0.1 0.0    Chemistries  Recent Labs  Lab 08/04/19 0319 08/05/19 0340 08/06/19 1323 08/07/19 2254 08/09/19 0218  NA 141 140 145 143 143  K 4.5 4.2 4.1 4.2 3.5  CL 105 106 109 106 111  CO2 26 26 26 23 23   GLUCOSE 124* 125* 96 120* 87  BUN 52* 46* 42* 54* 50*  CREATININE 1.29* 1.21* 1.45* 1.82* 1.47*  CALCIUM 8.5* 8.6* 9.2 9.2 8.3*  AST 33 28 32 22 19  ALT 22 22 25 21 15   ALKPHOS 39 40 45 49 37*  BILITOT 1.2 1.6* 1.9* 2.2* 2.1*   ------------------------------------------------------------------------------------------------------------------ No results for input(s): CHOL, HDL,  LDLCALC, TRIG, CHOLHDL, LDLDIRECT in the last 72 hours.  Lab Results  Component Value Date   HGBA1C  12/20/2007    5.7 (NOTE)   The ADA recommends the following therapeutic goals for glycemic   control related to Hgb A1C measurement:   Goal of Therapy:   < 7.0% Hgb A1C   Action Suggested:  > 8.0% Hgb A1C   Ref:  Diabetes Care, 22, Suppl. 1, 1999    ------------------------------------------------------------------------------------------------------------------ No results for input(s): TSH, T4TOTAL, T3FREE, THYROIDAB in the last 72 hours.  Invalid input(s): FREET3 ------------------------------------------------------------------------------------------------------------------ No results for input(s): VITAMINB12, FOLATE, FERRITIN, TIBC, IRON, RETICCTPCT in the last 72 hours.  Coagulation profile No results for input(s): INR, PROTIME in the last 168 hours.  No results for input(s): DDIMER in the last 72 hours.  Cardiac Enzymes No results for input(s): CKMB, TROPONINI, MYOGLOBIN in the last 168 hours.  Invalid input(s): CK ------------------------------------------------------------------------------------------------------------------    Component Value Date/Time   BNP 58.7 08/02/2019 0220   BNP 33.5 09/16/2015 1139    Inpatient Medications  Scheduled Meds:  vitamin C  500 mg Oral Daily   feeding supplement (ENSURE ENLIVE)  237 mL Oral BID BM   heparin  5,000 Units Subcutaneous Q8H   sodium chloride flush  3 mL Intravenous Q12H   zinc sulfate  220 mg Oral Daily   Continuous Infusions:  sodium chloride     PRN Meds:.sodium chloride, sodium chloride flush  Micro Results Recent Results (from the past 240 hour(s))  Blood Culture (routine x 2)     Status: None   Collection Time: 07/31/19  5:48 PM   Specimen: BLOOD  Result Value Ref Range Status   Specimen Description   Final    BLOOD LEFT ANTECUBITAL Performed at [facility], [address] [place], [place] [address]    Special Requests   Final    BOTTLES DRAWN AEROBIC AND ANAEROBIC Blood Culture adequate volume Performed at [facility], [address] [place], [place] [address]    Culture   Final    NO GROWTH 5 DAYS Performed at [facility], [address] [place], [place] [address]    Report Status  08/05/2019 FINAL  Final  Blood Culture (routine x 2)     Status: None   Collection Time: 07/31/19  5:57 PM   Specimen: BLOOD  Result Value Ref Range Status   Specimen Description   Final    BLOOD RIGHT ANTECUBITAL Performed at [facility], [address] [place], [place] [address]    Special Requests   Final    BOTTLES DRAWN AEROBIC AND ANAEROBIC Blood Culture adequate volume Performed at [facility], [address], [place], [place] [address]    Culture   Final    NO GROWTH 5 DAYS Performed at [facility], [address], [place], [place] [address]    Report Status 08/05/2019 FINAL  Final  Urine culture     Status: Abnormal   Collection Time: 08/06/19  3:27 PM   Specimen: Urine, Random  Result Value Ref Range Status   Specimen Description URINE, RANDOM  Final   Special Requests NONE  Final   Culture (A)  Final    <10,000 COLONIES/mL INSIGNIFICANT GROWTH Performed at [facility], [address] [place], [place] [address]    Report Status 08/07/2019 FINAL  Final  MRSA PCR Screening     Status: None   Collection Time:  08/09/19 11:30 AM  Result Value Ref Range Status   MRSA by PCR NEGATIVE NEGATIVE Final    Comment:        The GeneXpert MRSA Assay (FDA approved for NASAL specimens only), is one component of a comprehensive MRSA colonization surveillance program. It is not intended to diagnose MRSA infection nor to guide or monitor treatment for MRSA infections. Performed at [facility], [address] [place], [place] [address]     Radiology Reports CT Head Wo Contrast  Result Date: 08/07/2019 CLINICAL DATA:  Altered mental status, COVID-19 diagnosis 1.5 weeks prior with admission for delirium on 08/06/2019 EXAM: CT HEAD WITHOUT CONTRAST TECHNIQUE: Contiguous axial images were obtained from the base of the skull through the vertex without intravenous contrast. COMPARISON:  CT head 08/06/2019,  05/04/2016 FINDINGS: Brain: Image quality is degraded due to patient motion. Redemonstration of a region of gliosis in the left occipital lobe which is unchanged from 1 day prior but new since 2017 comparisons. Overall finding favors a remote interval infarct but remains grossly age indeterminate. Prior lacunar infarcts are noted in the bilateral basal ganglia as is some stable senescent mineralization. No other sites concerning for acute infarction. No visible hemorrhage, hydrocephalus, extra-axial collection or mass lesion/mass effect. Symmetric moderate prominence of the ventricles, cisterns and sulci compatible with parenchymal volume loss. Confluent areas of white matter hypoattenuation are most compatible with chronic microvascular angiopathy. Vascular: Atherosclerotic calcification of the carotid siphons and intradural vertebral arteries. No hyperdense vessel. Skull: No calvarial fracture or suspicious osseous lesion. No scalp swelling or hematoma. Sinuses/Orbits: Paranasal sinuses and mastoid air cells are predominantly clear. Debris in the left external auditory canal. Orbital structures are unremarkable aside from prior lens extractions. Other: None IMPRESSION: Image quality degraded due to motion artifact. Redemonstration of a region of gliosis in the left occipital lobe which is unchanged from 1 day prior but new since 2017 comparisons. Overall finding favors a remote interval infarct but remains grossly age indeterminate. If there is concern that this may be subacute, consider MR for further evaluation though only if patient is able to remain still for the procedure. Remote bilateral basal ganglia lacunar infarcts. Extensive chronic microvascular angiopathy and moderate parenchymal volume loss, similar to priors. Intracranial atherosclerosis. Debris in the left external auditory canal, correlate for cerumen impaction. Electronically Signed   By: Kreg Shropshire M.D.   On: 08/07/2019 23:41   CT Head Wo  Contrast  Result Date: 08/06/2019 CLINICAL DATA:  Encephalopathy EXAM: CT HEAD WITHOUT CONTRAST CT CERVICAL SPINE WITHOUT CONTRAST TECHNIQUE: Multidetector CT imaging of the head and cervical spine was performed following the standard protocol without intravenous contrast. Multiplanar CT image reconstructions of the cervical spine were also generated. COMPARISON:  CT 05/04/2016 FINDINGS: CT HEAD FINDINGS Brain: Motion degradation of the exam. Repeat exam with some improvement. Hypodensities within the LEFT and RIGHT external capsule is consistent remote white matter infarctions. Focal encephalomalacia within the LEFT occipital lobe is new from comparison exam 2017 (image 13/30. No clear acute cortical Concern cortical atrophy. Generalized subcortical white matter hypodensities. No extra-axial collections Vascular: No hyperdense vessel or unexpected calcification. Skull: Normal. Negative for fracture or focal lesion. Sinuses/Orbits: Paranasal sinuses and mastoid air cells are clear. Orbits are clear. Other: None. CT CERVICAL SPINE FINDINGS Alignment: Normal alignment of the cervical vertebral bodies. Skull base and vertebrae: Normal craniocervical junction. No loss of vertebral body height or disc height. Normal facet articulation. No evidence of fracture. Soft tissues and spinal canal:  No prevertebral soft tissue swelling. No perispinal or epidural hematoma. Disc levels: Endplate spurring and disc space narrowing from C4 to the C7. No acute findings Upper chest: Clear Other: None IMPRESSION: 1. No acute intracranial findings. 2. Remote deep white matter external capsule infarctions. 3. Probable remote LEFT occipital lobe infarction. 4. Generalized atrophy and white matter microvascular disease. 5. No cervical spine fracture. 6. Multilevel disc osteophytic disease. Electronically Signed   By: Genevive Bi M.D.   On: 08/06/2019 14:41   CT Cervical Spine Wo Contrast  Result Date: 08/06/2019 CLINICAL DATA:   Encephalopathy EXAM: CT HEAD WITHOUT CONTRAST CT CERVICAL SPINE WITHOUT CONTRAST TECHNIQUE: Multidetector CT imaging of the head and cervical spine was performed following the standard protocol without intravenous contrast. Multiplanar CT image reconstructions of the cervical spine were also generated. COMPARISON:  CT 05/04/2016 FINDINGS: CT HEAD FINDINGS Brain: Motion degradation of the exam. Repeat exam with some improvement. Hypodensities within the LEFT and RIGHT external capsule is consistent remote white matter infarctions. Focal encephalomalacia within the LEFT occipital lobe is new from comparison exam 2017 (image 13/30. No clear acute cortical Concern cortical atrophy. Generalized subcortical white matter hypodensities. No extra-axial collections Vascular: No hyperdense vessel or unexpected calcification. Skull: Normal. Negative for fracture or focal lesion. Sinuses/Orbits: Paranasal sinuses and mastoid air cells are clear. Orbits are clear. Other: None. CT CERVICAL SPINE FINDINGS Alignment: Normal alignment of the cervical vertebral bodies. Skull base and vertebrae: Normal craniocervical junction. No loss of vertebral body height or disc height. Normal facet articulation. No evidence of fracture. Soft tissues and spinal canal: No prevertebral soft tissue swelling. No perispinal or epidural hematoma. Disc levels: Endplate spurring and disc space narrowing from C4 to the C7. No acute findings Upper chest: Clear Other: None IMPRESSION: 1. No acute intracranial findings. 2. Remote deep white matter external capsule infarctions. 3. Probable remote LEFT occipital lobe infarction. 4. Generalized atrophy and white matter microvascular disease. 5. No cervical spine fracture. 6. Multilevel disc osteophytic disease. Electronically Signed   By: Genevive Bi M.D.   On: 08/06/2019 14:41   DG Chest Port 1 View  Result Date: 07/31/2019 CLINICAL DATA:  Diagnosed as COVID-19 positive on 07/28/2019, increased  weakness, decreased appetite, dehydration for 3 days, history Alzheimer's, stroke, coronary artery disease post MI, CHF, ischemic cardiomyopathy, hypertension EXAM: PORTABLE CHEST 1 VIEW COMPARISON:  Portable exam 1722 hours compared to 11/10/2014 FINDINGS: Upper normal heart size. Mediastinal contours and pulmonary vascularity normal. Atherosclerotic calcification aorta. Patchy infiltrates identified LEFT upper lobe and RIGHT base consistent with multifocal pneumonia. No pleural effusion or pneumothorax. Bones demineralized. IMPRESSION: Patchy infiltrates LEFT upper lobe and RIGHT base consistent with multifocal pneumonia. Electronically Signed   By: Ulyses Southward M.D.   On: 07/31/2019 17:59     Huey Bienenstock M.D on 08/09/2019 at 3:51 PM  Between 7am to 7pm - Pager - [phone]  After 7pm go to [email or web address] - password [facility]  [facility] -  Office  [phone]

## 2019-08-10 DIAGNOSIS — R627 Adult failure to thrive: Secondary | ICD-10-CM

## 2019-08-10 LAB — COMPREHENSIVE METABOLIC PANEL
ALT: 16 U/L (ref 0–44)
AST: 20 U/L (ref 15–41)
Albumin: 3 g/dL — ABNORMAL LOW (ref 3.5–5.0)
Alkaline Phosphatase: 38 U/L (ref 38–126)
Anion gap: 7 (ref 5–15)
BUN: 37 mg/dL — ABNORMAL HIGH (ref 8–23)
CO2: 24 mmol/L (ref 22–32)
Calcium: 8.4 mg/dL — ABNORMAL LOW (ref 8.9–10.3)
Chloride: 113 mmol/L — ABNORMAL HIGH (ref 98–111)
Creatinine, Ser: 1.22 mg/dL — ABNORMAL HIGH (ref 0.44–1.00)
GFR calc Af Amer: 42 mL/min — ABNORMAL LOW (ref >60)
GFR calc non Af Amer: 36 mL/min — ABNORMAL LOW (ref >60)
Glucose, Bld: 81 mg/dL (ref 70–99)
Potassium: 3.6 mmol/L (ref 3.5–5.1)
Sodium: 144 mmol/L (ref 135–145)
Total Bilirubin: 2 mg/dL — ABNORMAL HIGH (ref 0.3–1.2)
Total Protein: 5 g/dL — ABNORMAL LOW (ref 6.5–8.1)

## 2019-08-10 NOTE — Evaluation (Signed)
Physical Therapy Evaluation Patient Details Name: [name] MRN: [id number] DOB: Jan 15, 1917 Today's Date: 08/10/2019   History of Present Illness  86 y.o.femalewith medical history significant ofdementia, hypertension, prior CVA sent in from her nursing home because of frequent falls and not eating and drinking very well. Patient cannot provide any history secondary to her dementia. She has been more confused than normal. She is recently had Covid and was treated with remdesivir and Decadron discharged on August 05, 2019. Patient today found to have acute kidney injury with a creatinine bump up to 1.8. O2 sats are normal on room air. Patient be referred for admission for dehydration in the setting of continued weakness after Covid infection.  Clinical Impression   Pt admitted with above hx and dx. Pt was recently admitted to hospital with COVID and was dc to assisted living. Daughter reports that prior to initial COVID admission to hospital pt was living in independent living and was not this confused. This am pt is extremely confused this with HOH makes communicating quite difficult. Pt needed min to mod a wit mobility was able to ambulate approx 33ft with RW and mod a. Pt was on room air and mostly stayed  In 90s, monitor had  Instances of reading quite low saturations but this was because pedi probe on earlobe was positioned in correctly. Pt will benefit from continued PT tx while in hospital to address deficits in strength, balance and coordination, independence and activity tolerance. She may be dc back to home SNF when medically able.     Follow Up Recommendations SNF    Equipment Recommendations  None recommended by PT    Recommendations for Other Services       Precautions / Restrictions Precautions Precautions: Fall Restrictions Weight Bearing Restrictions: No      Mobility  Bed Mobility               General bed mobility comments: Pt sitting in  recliner at therapist arrival  Transfers Overall transfer level: Needs assistance Equipment used: Rolling walker (2 wheeled) Transfers: Sit to/from Stand Sit to Stand: Min assist            Ambulation/Gait Ambulation/Gait assistance: Mod assist Gait Distance (Feet): 44 Feet Assistive device: Rolling walker (2 wheeled) Gait Pattern/deviations: Step-through pattern;Decreased stride length;Narrow base of support     General Gait Details: noted limping with ambulation   Stairs            Wheelchair Mobility    Modified Rankin (Stroke Patients Only)       Balance Overall balance assessment: Needs assistance Sitting-balance support: Feet supported Sitting balance-Leahy Scale: Fair     Standing balance support: During functional activity;Bilateral upper extremity supported Standing balance-Leahy Scale: Poor                               Pertinent Vitals/Pain Pain Assessment: No/denies pain    Home Living Family/patient expects to be discharged to:: Other (Comment)(daughter reports was in independent living prior to COVID dx)               Home Equipment: Dan Humphreys - 2 wheels Additional Comments: per chart and pt she amb with RW at her ALF, [facility]    Prior Function Level of Independence: Needs assistance   Gait / Transfers Assistance Needed: ambulated with RW  ADL's / Homemaking Assistance Needed: assisted by staff at facility after initiall COVID admission  Hand Dominance        Extremity/Trunk Assessment   Upper Extremity Assessment Upper Extremity Assessment: Generalized weakness    Lower Extremity Assessment Lower Extremity Assessment: Generalized weakness       Communication   Communication: HOH  Cognition Arousal/Alertness: Lethargic Behavior During Therapy: WFL for tasks assessed/performed Overall Cognitive Status: History of cognitive impairments - at baseline                                         General Comments      Exercises     Assessment/Plan    PT Assessment Patient needs continued PT services  PT Problem List Decreased strength;Decreased activity tolerance;Decreased balance;Decreased mobility;Decreased coordination;Decreased cognition;Decreased knowledge of use of DME;Decreased safety awareness       PT Treatment Interventions DME instruction;Gait training;Functional mobility training;Therapeutic activities;Patient/family education;Therapeutic exercise    PT Goals (Current goals can be found in the Care Plan section)  Acute Rehab PT Goals Patient Stated Goal: did not state any goals, very confused this am PT Goal Formulation: Patient unable to participate in goal setting Time For Goal Achievement: 08/24/19 Potential to Achieve Goals: Fair    Frequency Min 2X/week   Barriers to discharge        Co-evaluation               AM-PAC PT "6 Clicks" Mobility  Outcome Measure Help needed turning from your back to your side while in a flat bed without using bedrails?: A Little Help needed moving from lying on your back to sitting on the side of a flat bed without using bedrails?: A Little Help needed moving to and from a bed to a chair (including a wheelchair)?: A Lot Help needed standing up from a chair using your arms (e.g., wheelchair or bedside chair)?: A Lot Help needed to walk in hospital room?: A Lot Help needed climbing 3-5 steps with a railing? : A Lot 6 Click Score: 14    End of Session Equipment Utilized During Treatment: Gait belt Activity Tolerance: Treatment limited secondary to medical complications (Comment);Patient limited by fatigue;Patient limited by lethargy Patient left: in chair;with call bell/phone within reach;with chair alarm set Nurse Communication: Mobility status PT Visit Diagnosis: Other abnormalities of gait and mobility (R26.89)    Time: 9767-3419 PT Time Calculation (min) (ACUTE ONLY): 12 min   Charges:   PT  Evaluation $PT Eval Moderate Complexity: [place], PT   Delford Field 08/10/2019, 1:13 PM

## 2019-08-10 NOTE — Plan of Care (Signed)
Pt A&Ox1 to self only. Able to interact w/ conversation but very forgetful, attempts to reorient unsuccessful. VSS, SpO2 >88% on RA. No c/o pain throughout shift. Utilizing purewick to void, adequate output  Bathed, teeth brushed, OOBTC. Tolerated transition well  Worked w/ PT & OT, see notes for details  Daughter [name] [phone]) called & updated on plan of care. All questions answered at this time  Encouraging PO intake, Pt eatinf <25% of meals each time. Given ensure shakes to supplement.  All safety measures in place, will report to oncoming shift  [name]    Problem: Education: Goal: Knowledge of risk factors and measures for prevention of condition will improve Outcome: Progressing   Problem: Coping: Goal: Psychosocial and spiritual needs will be supported Outcome: Progressing   Problem: Respiratory: Goal: Will maintain a patent airway Outcome: Progressing Goal: Complications related to the disease process, condition or treatment will be avoided or minimized Outcome: Progressing   Problem: Education: Goal: Knowledge of General Education information will improve Description: Including pain rating scale, medication(s)/side effects and non-pharmacologic comfort measures Outcome: Progressing   Problem: Health Behavior/Discharge Planning: Goal: Ability to manage health-related needs will improve Outcome: Progressing   Problem: Clinical Measurements: Goal: Ability to maintain clinical measurements within normal limits will improve Outcome: Progressing Goal: Will remain free from infection Outcome: Progressing Goal: Diagnostic test results will improve Outcome: Progressing Goal: Respiratory complications will improve Outcome: Progressing Goal: Cardiovascular complication will be avoided Outcome: Progressing   Problem: Activity: Goal: Risk for activity intolerance will decrease Outcome: Progressing   Problem: Nutrition: Goal: Adequate  nutrition will be maintained Outcome: Progressing   Problem: Coping: Goal: Level of anxiety will decrease Outcome: Progressing   Problem: Elimination: Goal: Will not experience complications related to bowel motility Outcome: Progressing Goal: Will not experience complications related to urinary retention Outcome: Progressing   Problem: Pain Managment: Goal: General experience of comfort will improve Outcome: Progressing   Problem: Safety: Goal: Ability to remain free from injury will improve Outcome: Progressing   Problem: Skin Integrity: Goal: Risk for impaired skin integrity will decrease Outcome: Progressing

## 2019-08-10 NOTE — Progress Notes (Signed)
Occupational Therapy Evaluation Patient Details Name: [name] MRN: [id number] DOB: 13-May-1917 Today's Date: 08/10/2019    History of Present Illness 86 y.o.femalewith medical history significant ofdementia, hypertension, prior CVA sent in from her nursing home because of frequent falls and not eating and drinking very well. Patient cannot provide any history secondary to her dementia. She has been more confused than normal. She is recently had Covid and was treated with remdesivir and Decadron discharged on August 05, 2019. Patient today found to have acute kidney injury with a creatinine bump up to 1.8. O2 sats are normal on room air. Patient be referred for admission for dehydration in the setting of continued weakness after Covid infection.   Clinical Impression   Patient has history of dementia and is HOH but is quite pleasant.  She lives in independent living and per chart was able to complete ADLs when cued by staff.  Patient required min assist with transfers and mod assist with rolling walker for functional mobility.  She was able to perform grooming and eating while seated with set up/min assist for cueing.  She was on room air throughout session and SpO2 remained in the mid 90s.  Will continue to follow acutely with OT to address the deficits listed below. Recommending discharge to SNF level of care.    Follow Up Recommendations  SNF    Equipment Recommendations  None recommended by OT    Recommendations for Other Services       Precautions / Restrictions Precautions Precautions: Fall Restrictions Weight Bearing Restrictions: No      Mobility Bed Mobility               General bed mobility comments: Pt sitting in recliner at therapist arrival  Transfers Overall transfer level: Needs assistance Equipment used: Rolling walker (2 wheeled) Transfers: Sit to/from Stand Sit to Stand: Min assist              Balance Overall balance assessment:  Needs assistance Sitting-balance support: Feet supported Sitting balance-Leahy Scale: Fair     Standing balance support: During functional activity;Bilateral upper extremity supported Standing balance-Leahy Scale: Poor                             ADL either performed or assessed with clinical judgement   ADL Overall ADL's : Needs assistance/impaired Eating/Feeding: Set up;Cueing for sequencing;Sitting   Grooming: Minimal assistance;Cueing for sequencing;Sitting   Upper Body Bathing: Moderate assistance;Sitting   Lower Body Bathing: Maximal assistance;Sitting/lateral leans   Upper Body Dressing : Minimal assistance;Sitting   Lower Body Dressing: Maximal assistance;Sitting/lateral leans   Toilet Transfer: Moderate assistance;Cueing for safety;Cueing for sequencing;Ambulation   Toileting- Clothing Manipulation and Hygiene: Maximal assistance;Sitting/lateral lean       Functional mobility during ADLs: Moderate assistance;Rolling walker       Vision Baseline Vision/History: Wears glasses       Perception     Praxis      Pertinent Vitals/Pain Pain Assessment: No/denies pain     Hand Dominance Right   Extremity/Trunk Assessment Upper Extremity Assessment Upper Extremity Assessment: Generalized weakness           Communication Communication Communication: HOH   Cognition Arousal/Alertness: Lethargic Behavior During Therapy: WFL for tasks assessed/performed Overall Cognitive Status: History of cognitive impairments - at baseline  General Comments: Pleasant dementia   General Comments       Exercises     Shoulder Instructions      Home Living Family/patient expects to be discharged to:: Other (Comment)                             Home Equipment: Walker - 2 wheels   Additional Comments: per chart and pt she amb with RW at her ALF, [facility]      Prior Functioning/Environment  Level of Independence: Needs assistance  Gait / Transfers Assistance Needed: ambulated with RW ADL's / Homemaking Assistance Needed: assisted by staff at facility after initiall COVID admission Communication / Swallowing Assistance Needed: HOH, confused          OT Problem List: Decreased strength;Decreased activity tolerance;Impaired balance (sitting and/or standing);Impaired vision/perception;Decreased safety awareness;Decreased cognition;Cardiopulmonary status limiting activity      OT Treatment/Interventions: Self-care/ADL training;Therapeutic exercise;Energy conservation;Therapeutic activities    OT Goals(Current goals can be found in the care plan section) Acute Rehab OT Goals Patient Stated Goal: Did not state OT Goal Formulation: Patient unable to participate in goal setting Time For Goal Achievement: 08/24/19 Potential to Achieve Goals: Good  OT Frequency: Min 2X/week   Barriers to D/C:            Co-evaluation              AM-PAC OT "6 Clicks" Daily Activity     Outcome Measure Help from another person eating meals?: A Little Help from another person taking care of personal grooming?: A Lot Help from another person toileting, which includes using toliet, bedpan, or urinal?: A Lot Help from another person bathing (including washing, rinsing, drying)?: A Lot Help from another person to put on and taking off regular upper body clothing?: A Little Help from another person to put on and taking off regular lower body clothing?: A Lot 6 Click Score: 14   End of Session Equipment Utilized During Treatment: Gait belt;Rolling walker Nurse Communication: Mobility status  Activity Tolerance: Patient tolerated treatment well Patient left: in chair;with call bell/phone within reach;with chair alarm set  OT Visit Diagnosis: Unsteadiness on feet (R26.81);Repeated falls (R29.6);Muscle weakness (generalized) (M62.81);Other symptoms and signs involving cognitive function                 Time: 0175-1025 OT Time Calculation (min): 33 min Charges:  OT General Charges $OT Visit: 1 Visit OT Evaluation $OT Eval Moderate Complexity: 1 Mod OT Treatments $Self Care/Home Management : 8-22 mins  Barbie Banner, OTR/L     [name] 08/10/2019, 3:21 PM

## 2019-08-10 NOTE — Progress Notes (Signed)
PROGRESS NOTE                                                                                                                                                                                                             Patient Demographics:    [name], is a 86 y.o. female, DOB - 28-Feb-1917, INO:[id number]  Admit date - 08/07/2019   Admitting Physician Phillips Grout, MD  Outpatient Primary MD for the patient is Lorene Dy, MD  LOS - 2   Chief Complaint  Patient presents with  . Fall       Brief Narrative     86 y.o. female with medical history significant of dementia, hypertension, prior CVA sent in from her nursing home because of frequent falls and not eating and drinking very well.  Patient cannot provide any history secondary to her dementia.  She has been more confused than normal.  She is recently had Covid and was treated with remdesivir and Decadron discharged on August 05, 2019.  Patient today found to have acute kidney injury with a creatinine bump up to 1.8.  O2 sats are normal on room air.  Patient be referred for admission for dehydration in the setting of continued weakness after Covid infection.   Subjective:    [name] today pleasantly confused, cannot provide any reliable complaints, is no significant events as discussed with staff.   Assessment  & Plan :    Principal Problem:   AKI (acute kidney injury) ([place]) Active Problems:   Memory deficit   Acute hypoxemic respiratory failure due to COVID-19 ([place])   FTT (failure to thrive) in adult   AKI/failure to thrive -This is most likely due to volume depletion/dehydration, from poor oral intake and failure to thrive post Covid infection. -AKI has resolved with IV fluids, will hold on further IV fluids with known EF 20 to 25% . -Oral intake remains poor as discussed with staff this morning, she had nothing for breakfast, will see how she doing with lunch  .  History of dementia -Known baseline of dementia, but she has been more confused, impulsive, initially, but this seems to be improving currently .  COVID  19 infection -Patient currently with no hypoxia, she already finished her treatment with steroids and remdesivir last admission.  Chronic systolic/diastolic CHF -Currently appears to be  dry, but will need to monitor closely given her history of CHF with known EF 20 to 25% in 2012.  Hypothyroidism -Continue with Synthroid  History of CVA -Continue with a statin  COVID-19 Labs  No results for input(s): DDIMER, FERRITIN, LDH, CRP in the last 72 hours.  No results found for: SARSCOV2NAA   Code Status : DNR  Family communication: Discussed with daughter 1/29  Disposition Plan  : Back to SNF  Barriers For Discharge : Need SNF placement, she is from [facility], daughter hoping for her to go to memory care at [facility].  Consults  :  none  Procedures  : none  DVT Prophylaxis  : [place] heparin  Lab Results  Component Value Date   PLT 132 (L) 08/09/2019    Antibiotics  :    Anti-infectives (From admission, onward)   None        Objective:   Vitals:   08/09/19 2015 08/09/19 2335 08/10/19 0448 08/10/19 0700  BP:   121/72 123/66  Pulse: (!) 59 (!) 59 65 65  Resp:   17   Temp:   97.8 F (36.6 C) 98.2 F (36.8 C)  TempSrc:   Oral Oral  SpO2: 98% 97% 99% 93%  Weight:      Height:        Wt Readings from Last 3 Encounters:  08/08/19 52.2 kg  07/31/19 55.8 kg  09/16/18 56.7 kg     Intake/Output Summary (Last 24 hours) at 08/10/2019 1440 Last data filed at 08/10/2019 1350 Gross per 24 hour  Intake 1160 ml  Output 400 ml  Net 760 ml     Physical Exam . Awake Alert, more talkative and appropriate today, but remains easily distracted,  hard of hearing, but remains significantly confused. Symmetrical Chest wall movement, Good air movement bilaterally, CTAB RRR,No Gallops,Rubs or new  Murmurs, No Parasternal Heave +ve B.Sounds, Abd Soft, No tenderness, No rebound - guarding or rigidity. No Cyanosis, Clubbing or edema, No new Rash or bruise       Data Review:    CBC Recent Labs  Lab 08/04/19 0319 08/05/19 0340 08/06/19 1323 08/07/19 2254 08/09/19 0218  WBC 6.7 7.3 9.7 11.0* 7.5  HGB 10.9* 11.5* 13.5 13.5 10.7*  HCT 33.6* 35.2* 41.8 42.6 32.9*  PLT 142* 153 178 185 132*  MCV 94.1 93.6 93.7 95.3 94.8  MCH 30.5 30.6 30.3 30.2 30.8  MCHC 32.4 32.7 32.3 31.7 32.5  RDW 13.9 13.6 13.4 13.6 13.8  LYMPHSABS 2.8 3.4 3.5 4.5* 3.2  MONOABS 0.6 0.7 0.7 0.9 0.7  EOSABS 0.0 0.0 0.0 0.1 0.2  BASOSABS 0.0 0.0 0.0 0.1 0.0    Chemistries  Recent Labs  Lab 08/05/19 0340 08/06/19 1323 08/07/19 2254 08/09/19 0218 08/10/19 0053  NA 140 145 143 143 144  K 4.2 4.1 4.2 3.5 3.6  CL 106 109 106 111 113*  CO2 26 26 23 23 24   GLUCOSE 125* 96 120* 87 81  BUN 46* 42* 54* 50* 37*  CREATININE 1.21* 1.45* 1.82* 1.47* 1.22*  CALCIUM 8.6* 9.2 9.2 8.3* 8.4*  AST 28 32 22 19 20   ALT 22 25 21 15 16   ALKPHOS 40 45 49 37* 38  BILITOT 1.6* 1.9* 2.2* 2.1* 2.0*   ------------------------------------------------------------------------------------------------------------------ No results for input(s): CHOL, HDL, LDLCALC, TRIG, CHOLHDL, LDLDIRECT in the last 72 hours.  Lab Results  Component Value Date   HGBA1C  12/20/2007    5.7 (NOTE)   The ADA  recommends the following therapeutic goals for glycemic   control related to Hgb A1C measurement:   Goal of Therapy:   < 7.0% Hgb A1C   Action Suggested:  > 8.0% Hgb A1C   Ref:  Diabetes Care, 22, Suppl. 1, 1999   ------------------------------------------------------------------------------------------------------------------ No results for input(s): TSH, T4TOTAL, T3FREE, THYROIDAB in the last 72 hours.  Invalid input(s):  FREET3 ------------------------------------------------------------------------------------------------------------------ No results for input(s): VITAMINB12, FOLATE, FERRITIN, TIBC, IRON, RETICCTPCT in the last 72 hours.  Coagulation profile No results for input(s): INR, PROTIME in the last 168 hours.  No results for input(s): DDIMER in the last 72 hours.  Cardiac Enzymes No results for input(s): CKMB, TROPONINI, MYOGLOBIN in the last 168 hours.  Invalid input(s): CK ------------------------------------------------------------------------------------------------------------------    Component Value Date/Time   BNP 58.7 08/02/2019 0220   BNP 33.5 09/16/2015 1139    Inpatient Medications  Scheduled Meds: . vitamin C  500 mg Oral Daily  . feeding supplement (ENSURE ENLIVE)  237 mL Oral BID BM  . heparin  5,000 Units Subcutaneous Q8H  . sodium chloride flush  3 mL Intravenous Q12H  . zinc sulfate  220 mg Oral Daily   Continuous Infusions: . sodium chloride     PRN Meds:.sodium chloride, sodium chloride flush  Micro Results Recent Results (from the past 240 hour(s))  Blood Culture (routine x 2)     Status: None   Collection Time: 07/31/19  5:48 PM   Specimen: BLOOD  Result Value Ref Range Status   Specimen Description   Final    BLOOD LEFT ANTECUBITAL Performed at [facility], [address], [place], [place] [address]    Special Requests   Final    BOTTLES DRAWN AEROBIC AND ANAEROBIC Blood Culture adequate volume Performed at [facility], [address] [place], [place] [address]    Culture   Final    NO GROWTH 5 DAYS Performed at [facility], [address] [place], [place] [address]    Report Status 08/05/2019 FINAL  Final  Blood Culture (routine x 2)     Status: None   Collection Time: 07/31/19  5:57 PM   Specimen: BLOOD  Result Value Ref Range Status   Specimen Description   Final    BLOOD RIGHT  ANTECUBITAL Performed at [facility], [address], [place], [place] [address]    Special Requests   Final    BOTTLES DRAWN AEROBIC AND ANAEROBIC Blood Culture adequate volume Performed at [facility], [address], [place], [place] [address]    Culture   Final    NO GROWTH 5 DAYS Performed at [facility], [address], [place], [place] [address]    Report Status 08/05/2019 FINAL  Final  Urine culture     Status: Abnormal   Collection Time: 08/06/19  3:27 PM   Specimen: Urine, Random  Result Value Ref Range Status   Specimen Description URINE, RANDOM  Final   Special Requests NONE  Final   Culture (A)  Final    <10,000 COLONIES/mL INSIGNIFICANT GROWTH Performed at [facility], [address] [place], [place] [address]    Report Status 08/07/2019 FINAL  Final  MRSA PCR Screening     Status: None   Collection Time: 08/09/19 11:30 AM  Result Value Ref Range Status   MRSA by PCR NEGATIVE NEGATIVE Final    Comment:        The GeneXpert  MRSA Assay (FDA approved for NASAL specimens only), is one component of a comprehensive MRSA colonization surveillance program. It is not intended to diagnose MRSA infection nor to guide or monitor treatment for MRSA infections. Performed at [facility], [address], [place], [place] [address]     Radiology Reports CT Head Wo Contrast  Result Date: 08/07/2019 CLINICAL DATA:  Altered mental status, COVID-19 diagnosis 1.5 weeks prior with admission for delirium on 08/06/2019 EXAM: CT HEAD WITHOUT CONTRAST TECHNIQUE: Contiguous axial images were obtained from the base of the skull through the vertex without intravenous contrast. COMPARISON:  CT head 08/06/2019, 05/04/2016 FINDINGS: Brain: Image quality is degraded due to patient motion. Redemonstration of a region of gliosis in the left occipital lobe which is unchanged from 1 day prior but new since 2017  comparisons. Overall finding favors a remote interval infarct but remains grossly age indeterminate. Prior lacunar infarcts are noted in the bilateral basal ganglia as is some stable senescent mineralization. No other sites concerning for acute infarction. No visible hemorrhage, hydrocephalus, extra-axial collection or mass lesion/mass effect. Symmetric moderate prominence of the ventricles, cisterns and sulci compatible with parenchymal volume loss. Confluent areas of white matter hypoattenuation are most compatible with chronic microvascular angiopathy. Vascular: Atherosclerotic calcification of the carotid siphons and intradural vertebral arteries. No hyperdense vessel. Skull: No calvarial fracture or suspicious osseous lesion. No scalp swelling or hematoma. Sinuses/Orbits: Paranasal sinuses and mastoid air cells are predominantly clear. Debris in the left external auditory canal. Orbital structures are unremarkable aside from prior lens extractions. Other: None IMPRESSION: Image quality degraded due to motion artifact. Redemonstration of a region of gliosis in the left occipital lobe which is unchanged from 1 day prior but new since 2017 comparisons. Overall finding favors a remote interval infarct but remains grossly age indeterminate. If there is concern that this may be subacute, consider MR for further evaluation though only if patient is able to remain still for the procedure. Remote bilateral basal ganglia lacunar infarcts. Extensive chronic microvascular angiopathy and moderate parenchymal volume loss, similar to priors. Intracranial atherosclerosis. Debris in the left external auditory canal, correlate for cerumen impaction. Electronically Signed   By: Kreg Shropshire M.D.   On: 08/07/2019 23:41   CT Head Wo Contrast  Result Date: 08/06/2019 CLINICAL DATA:  Encephalopathy EXAM: CT HEAD WITHOUT CONTRAST CT CERVICAL SPINE WITHOUT CONTRAST TECHNIQUE: Multidetector CT imaging of the head and cervical spine  was performed following the standard protocol without intravenous contrast. Multiplanar CT image reconstructions of the cervical spine were also generated. COMPARISON:  CT 05/04/2016 FINDINGS: CT HEAD FINDINGS Brain: Motion degradation of the exam. Repeat exam with some improvement. Hypodensities within the LEFT and RIGHT external capsule is consistent remote white matter infarctions. Focal encephalomalacia within the LEFT occipital lobe is new from comparison exam 2017 (image 13/30. No clear acute cortical Concern cortical atrophy. Generalized subcortical white matter hypodensities. No extra-axial collections Vascular: No hyperdense vessel or unexpected calcification. Skull: Normal. Negative for fracture or focal lesion. Sinuses/Orbits: Paranasal sinuses and mastoid air cells are clear. Orbits are clear. Other: None. CT CERVICAL SPINE FINDINGS Alignment: Normal alignment of the cervical vertebral bodies. Skull base and vertebrae: Normal craniocervical junction. No loss of vertebral body height or disc height. Normal facet articulation. No evidence of fracture. Soft tissues and spinal canal: No prevertebral soft tissue swelling. No perispinal or epidural hematoma. Disc levels: Endplate spurring and disc space narrowing from C4 to the C7. No acute findings Upper chest: Clear Other:  None IMPRESSION: 1. No acute intracranial findings. 2. Remote deep white matter external capsule infarctions. 3. Probable remote LEFT occipital lobe infarction. 4. Generalized atrophy and white matter microvascular disease. 5. No cervical spine fracture. 6. Multilevel disc osteophytic disease. Electronically Signed   By: Genevive BiStewart  Edmunds M.D.   On: 08/06/2019 14:41   CT Cervical Spine Wo Contrast  Result Date: 08/06/2019 CLINICAL DATA:  Encephalopathy EXAM: CT HEAD WITHOUT CONTRAST CT CERVICAL SPINE WITHOUT CONTRAST TECHNIQUE: Multidetector CT imaging of the head and cervical spine was performed following the standard protocol without  intravenous contrast. Multiplanar CT image reconstructions of the cervical spine were also generated. COMPARISON:  CT 05/04/2016 FINDINGS: CT HEAD FINDINGS Brain: Motion degradation of the exam. Repeat exam with some improvement. Hypodensities within the LEFT and RIGHT external capsule is consistent remote white matter infarctions. Focal encephalomalacia within the LEFT occipital lobe is new from comparison exam 2017 (image 13/30. No clear acute cortical Concern cortical atrophy. Generalized subcortical white matter hypodensities. No extra-axial collections Vascular: No hyperdense vessel or unexpected calcification. Skull: Normal. Negative for fracture or focal lesion. Sinuses/Orbits: Paranasal sinuses and mastoid air cells are clear. Orbits are clear. Other: None. CT CERVICAL SPINE FINDINGS Alignment: Normal alignment of the cervical vertebral bodies. Skull base and vertebrae: Normal craniocervical junction. No loss of vertebral body height or disc height. Normal facet articulation. No evidence of fracture. Soft tissues and spinal canal: No prevertebral soft tissue swelling. No perispinal or epidural hematoma. Disc levels: Endplate spurring and disc space narrowing from C4 to the C7. No acute findings Upper chest: Clear Other: None IMPRESSION: 1. No acute intracranial findings. 2. Remote deep white matter external capsule infarctions. 3. Probable remote LEFT occipital lobe infarction. 4. Generalized atrophy and white matter microvascular disease. 5. No cervical spine fracture. 6. Multilevel disc osteophytic disease. Electronically Signed   By: Genevive BiStewart  Edmunds M.D.   On: 08/06/2019 14:41   DG Chest Port 1 View  Result Date: 07/31/2019 CLINICAL DATA:  Diagnosed as COVID-19 positive on 07/28/2019, increased weakness, decreased appetite, dehydration for 3 days, history Alzheimer's, stroke, coronary artery disease post MI, CHF, ischemic cardiomyopathy, hypertension EXAM: PORTABLE CHEST 1 VIEW COMPARISON:  Portable  exam 1722 hours compared to 11/10/2014 FINDINGS: Upper normal heart size. Mediastinal contours and pulmonary vascularity normal. Atherosclerotic calcification aorta. Patchy infiltrates identified LEFT upper lobe and RIGHT base consistent with multifocal pneumonia. No pleural effusion or pneumothorax. Bones demineralized. IMPRESSION: Patchy infiltrates LEFT upper lobe and RIGHT base consistent with multifocal pneumonia. Electronically Signed   By: Ulyses SouthwardMark  Boles M.D.   On: 07/31/2019 17:59     Huey Bienenstockawood Aneshia Jacquet M.D on 08/10/2019 at 2:40 PM  Between 7am to 7pm - Pager - [phone]  After 7pm go to [email or web address] - password [email or web address]  [facility] -  Office  [phone]

## 2019-08-11 LAB — COMPREHENSIVE METABOLIC PANEL
ALT: 17 U/L (ref 0–44)
AST: 19 U/L (ref 15–41)
Albumin: 3.1 g/dL — ABNORMAL LOW (ref 3.5–5.0)
Alkaline Phosphatase: 39 U/L (ref 38–126)
Anion gap: 9 (ref 5–15)
BUN: 31 mg/dL — ABNORMAL HIGH (ref 8–23)
CO2: 22 mmol/L (ref 22–32)
Calcium: 8.6 mg/dL — ABNORMAL LOW (ref 8.9–10.3)
Chloride: 112 mmol/L — ABNORMAL HIGH (ref 98–111)
Creatinine, Ser: 1.17 mg/dL — ABNORMAL HIGH (ref 0.44–1.00)
GFR calc Af Amer: 44 mL/min — ABNORMAL LOW (ref >60)
GFR calc non Af Amer: 38 mL/min — ABNORMAL LOW (ref >60)
Glucose, Bld: 88 mg/dL (ref 70–99)
Potassium: 3.7 mmol/L (ref 3.5–5.1)
Sodium: 143 mmol/L (ref 135–145)
Total Bilirubin: 1.7 mg/dL — ABNORMAL HIGH (ref 0.3–1.2)
Total Protein: 5.1 g/dL — ABNORMAL LOW (ref 6.5–8.1)

## 2019-08-11 NOTE — NC FL2 (Signed)
[place] LEVEL OF CARE SCREENING TOOL     IDENTIFICATION  Patient Name: [name] Birthdate: [birth year] Sex: female Admission Date (Current Location): 08/07/2019  [facility] and [place] Number:  Herbalist and Address:  [facility], [place] [address] [place], [place] [address]      Provider Number: [id number]  Attending Physician Name and Address:  Elgergawy, Silver Huguenin, MD  Relative Name and Phone Number:  Philip Aspen  Daughter [phone]    Current Level of Care: Hospital Recommended Level of Care: [place] Prior Approval Number:    Date Approved/Denied:   PASRR Number: [id number]  Discharge Plan: SNF    Current Diagnoses: Patient Active Problem List   Diagnosis Date Noted  . FTT (failure to thrive) in adult 08/08/2019  . Palliative care encounter   . Acute hypoxemic respiratory failure due to COVID-19 ([place]) 07/31/2019  . Advanced age 86/20[birth year]2021  . AKI (acute kidney injury) ([place]) 07/31/2019  . Memory deficit 06/06/2017  . Abnormal EKG 06/06/2017  . Recurrent falls 06/05/2017  . Diarrhea 04/20/2017  . Colitis 04/20/2017  . HFrEF (heart failure with reduced ejection fraction) ([place]) 04/20/2017  . Hypokalemia 04/20/2017  . Elevated bilirubin 04/20/2017  . CVA (cerebral infarction) 08/04/2011  . Ischemic cardiomyopathy   . Hypothyroidism   . Hyperlipemia     Orientation RESPIRATION BLADDER Height & Weight     Self  Normal Incontinent, External catheter Weight: 52.2 kg Height:  5\' 6"  (167.6 cm)  BEHAVIORAL SYMPTOMS/MOOD NEUROLOGICAL BOWEL NUTRITION STATUS      Incontinent Diet(Heart Healthy Diet)  AMBULATORY STATUS COMMUNICATION OF NEEDS Skin   Limited Assist Verbally Bruising(Bruising on left arm,, moisture skin damage on buttocks)                       Personal Care Assistance Level of Assistance  Bathing, Feeding, Dressing Bathing Assistance: Limited assistance Feeding  assistance: Limited assistance Dressing Assistance: Limited assistance Total Care Assistance: Limited assistance   Functional Limitations Info  Hearing, Sight Sight Info: Impaired Hearing Info: Impaired      SPECIAL CARE FACTORS FREQUENCY  PT (By licensed PT), OT (By licensed OT)     PT Frequency: PT x 5 OT Frequency: OT x 5            Contractures Contractures Info: (not documented)    Additional Factors Info  Code Status, Allergies, Insulin Sliding Scale, Isolation Precautions Code Status Info: DNR Allergies Info: Novocain, Tape, Procaine Hcl     Isolation Precautions Info: COVID Positive     Current Medications (2020-04-1420):  This is the current hospital active medication list Current Facility-Administered Medications  Medication Dose Route Frequency Provider Last Rate Last Admin  . 0.9 %  sodium chloride infusion  250 mL Intravenous PRN Derrill Kay A, MD      . ascorbic acid (VITAMIN C) tablet 500 mg  500 mg Oral Daily Derrill Kay A, MD   500 mg at 08/11/19 0848  . feeding supplement (ENSURE ENLIVE) (ENSURE ENLIVE) liquid 237 mL  237 mL Oral BID BM Elgergawy, Silver Huguenin, MD   237 mL at 08/11/19 0848  . heparin injection 5,000 Units  5,000 Units Subcutaneous Q8H Phillips Grout, MD   5,000 Units at 08/11/19 [phone]  . sodium chloride flush (NS) 0.9 % injection 3 mL  3 mL Intravenous Q12H Derrill Kay A, MD   3 mL at 08/11/19 0849  . sodium chloride  flush (NS) 0.9 % injection 3 mL  3 mL Intravenous PRN Tarry Kos A, MD      . zinc sulfate capsule 220 mg  220 mg Oral Daily Haydee Monica, MD   220 mg at 08/11/19 3419     Discharge Medications: Please see discharge summary for a list of discharge medications.  Relevant Imaging Results:  Relevant Lab Results:   Additional Information COVID Positive Patient.  Patients Social Security Number :  [id number].  Pt is from [employer], [place], Charity fundraiser

## 2019-08-11 NOTE — Plan of Care (Signed)
Pt A&Ox1, oriented to self only. Reoriented but easily forgets . VSS, SpO2 > 92% on RA. No c/o pain throughout shift. Utilizing [facility] to void w/ adequate output.  OOBTC for majority of shift, tolerated transfer well w/ 1A.   Daughter Annice Pih called & updated w/ plan of care. All questions answered at this time.   All safety measures in place, will report to oncoming shift.    Berneice Heinrich    Problem: Education: Goal: Knowledge of risk factors and measures for prevention of condition will improve Outcome: Progressing   Problem: Coping: Goal: Psychosocial and spiritual needs will be supported Outcome: Progressing   Problem: Respiratory: Goal: Will maintain a patent airway Outcome: Progressing Goal: Complications related to the disease process, condition or treatment will be avoided or minimized Outcome: Progressing   Problem: Education: Goal: Knowledge of General Education information will improve Description: Including pain rating scale, medication(s)/side effects and non-pharmacologic comfort measures Outcome: Progressing   Problem: Health Behavior/Discharge Planning: Goal: Ability to manage health-related needs will improve Outcome: Progressing   Problem: Clinical Measurements: Goal: Ability to maintain clinical measurements within normal limits will improve Outcome: Progressing Goal: Will remain free from infection Outcome: Progressing Goal: Diagnostic test results will improve Outcome: Progressing Goal: Respiratory complications will improve Outcome: Progressing Goal: Cardiovascular complication will be avoided Outcome: Progressing   Problem: Activity: Goal: Risk for activity intolerance will decrease Outcome: Progressing   Problem: Nutrition: Goal: Adequate nutrition will be maintained Outcome: Progressing   Problem: Coping: Goal: Level of anxiety will decrease Outcome: Progressing   Problem: Elimination: Goal: Will not experience complications  related to bowel motility Outcome: Progressing Goal: Will not experience complications related to urinary retention Outcome: Progressing   Problem: Pain Managment: Goal: General experience of comfort will improve Outcome: Progressing   Problem: Safety: Goal: Ability to remain free from injury will improve Outcome: Progressing   Problem: Skin Integrity: Goal: Risk for impaired skin integrity will decrease Outcome: Progressing

## 2019-08-11 NOTE — Progress Notes (Signed)
PROGRESS NOTE                                                                                                                                                                                                             Patient Demographics:    [name], is a [age] y.o. female, DOB - 1917-01-29, ZOX:[id number]  Admit date - 08/07/2019   Admitting Physician Haydee Monicaachal A David, MD  Outpatient Primary MD for the patient is Burton Apleyoberts, Ronald, MD  LOS - 3   Chief Complaint  Patient presents with  . Fall       Brief Narrative     [age] y.o. female with medical history significant of dementia, hypertension, prior CVA sent in from her nursing home because of frequent falls and not eating and drinking very well.  Patient cannot provide any history secondary to her dementia.  She has been more confused than normal.  She is recently had Covid and was treated with remdesivir and Decadron discharged on August 05, 2019.  Patient today found to have acute kidney injury with a creatinine bump up to 1.8.  O2 sats are normal on room air.  Patient be referred for admission for dehydration in the setting of continued weakness after Covid infection.   Subjective:    [name] today no significant events overnight, I have discussed with staff, her appetite appears to be improving later in the day , I have wished her happy birthday as it is today her 26[birth year] birthday .   Assessment  & Plan :    Principal Problem:   AKI (acute kidney injury) (HCC) Active Problems:   Memory deficit   Acute hypoxemic respiratory failure due to COVID-19 (HCC)   FTT (failure to thrive) in adult   AKI/failure to thrive -This is most likely due to volume depletion/dehydration, from poor oral intake and failure to thrive post Covid infection. -AKI has resolved with IV fluids, will hold on further IV fluids with known EF 20 to 25% . -Discussed with staff oral intake is improving, overall she  is having poor breakfast, but this improving during lunch and dinner, she was able to drink 1 Ensure yesterday, to finish 25% of her lunch and dinner, as well she was able to drink few juices .  History of dementia -Known baseline of dementia, but she has  been more confused initially, this has improved, appears mentation back to baseline.  COVID  19 infection -Patient currently with no hypoxia, she already finished her treatment with steroids and remdesivir last admission.  Chronic systolic/diastolic CHF -Currently appears to be dry, but will need to monitor closely given her history of CHF with known EF 20 to 25% in 2012.  Hypothyroidism -Continue with Synthroid  History of CVA -Continue with a statin  COVID-19 Labs  No results for input(s): DDIMER, FERRITIN, LDH, CRP in the last 72 hours.  No results found for: SARSCOV2NAA   Code Status : DNR  Family communication: Discussed with daughter 1/29  Disposition Plan  : Back to SNF  Barriers For Discharge : Need SNF placement, she is from [facility], daughter hoping for her to go to memory care at [facility].  Hopefully she can go there tomorrow as long oral intake is sufficient  Consults  :  none  Procedures  : none  DVT Prophylaxis  : Peak heparin  Lab Results  Component Value Date   PLT 132 (L) 08/09/2019    Antibiotics  :    Anti-infectives (From admission, onward)   None        Objective:   Vitals:   08/11/19 0427 08/11/19 0500 08/11/19 0646 08/11/19 0737  BP: 120/68   105/73  Pulse:  70 64 (!) 59  Resp: 18   18  Temp: 97.8 F (36.6 C)   (!) 97.5 F (36.4 C)  TempSrc: Oral   Oral  SpO2: 98% 96% 100% 99%  Weight:      Height:        Wt Readings from Last 3 Encounters:  08/08/19 52.2 kg  07/31/19 55.8 kg  09/16/18 56.7 kg     Intake/Output Summary (Last 24 hours) at 2020-06-120 1430 Last data filed at 2020-06-120 0753 Gross per 24 hour  Intake 490 ml  Output 1000 ml  Net -510 ml       Physical Exam . She is more awake and alert, and communicative today, and more appropriate as well . Symmetrical Chest wall movement, Good air movement bilaterally, CTAB RRR,No Gallops,Rubs or new Murmurs, No Parasternal Heave +ve B.Sounds, Abd Soft, No tenderness, No rebound - guarding or rigidity. No Cyanosis, Clubbing or edema, No new Rash or bruise       Data Review:    CBC Recent Labs  Lab 08/05/19 0340 08/06/19 1323 08/07/19 2254 08/09/19 0218  WBC 7.3 9.7 11.0* 7.5  HGB 11.5* 13.5 13.5 10.7*  HCT 35.2* 41.8 42.6 32.9*  PLT 153 178 185 132*  MCV 93.6 93.7 95.3 94.8  MCH 30.6 30.3 30.2 30.8  MCHC 32.7 32.3 31.7 32.5  RDW 13.6 13.4 13.6 13.8  LYMPHSABS 3.4 3.5 4.5* 3.2  MONOABS 0.7 0.7 0.9 0.7  EOSABS 0.0 0.0 0.1 0.2  BASOSABS 0.0 0.0 0.1 0.0    Chemistries  Recent Labs  Lab 08/06/19 1323 08/07/19 2254 08/09/19 0218 08/10/19 0053 08/11/19 0105  NA 145 143 143 144 143  K 4.1 4.2 3.5 3.6 3.7  CL 109 106 111 113* 112*  CO2 26 23 23 24 22   GLUCOSE 96 120* 87 81 88  BUN 42* 54* 50* 37* 31*  CREATININE 1.45* 1.82* 1.47* 1.22* 1.17*  CALCIUM 9.2 9.2 8.3* 8.4* 8.6*  AST 32 22 19 20 19   ALT 25 21 15 16 17   ALKPHOS 45 49 37* 38 39  BILITOT 1.9* 2.2* 2.1* 2.0* 1.7*   ------------------------------------------------------------------------------------------------------------------  No results for input(s): CHOL, HDL, LDLCALC, TRIG, CHOLHDL, LDLDIRECT in the last 72 hours.  Lab Results  Component Value Date   HGBA1C  12/20/2007    5.7 (NOTE)   The ADA recommends the following therapeutic goals for glycemic   control related to Hgb A1C measurement:   Goal of Therapy:   < 7.0% Hgb A1C   Action Suggested:  > 8.0% Hgb A1C   Ref:  Diabetes Care, 22, Suppl. 1, 1999   ------------------------------------------------------------------------------------------------------------------ No results for input(s): TSH, T4TOTAL, T3FREE, THYROIDAB in the last 72  hours.  Invalid input(s): FREET3 ------------------------------------------------------------------------------------------------------------------ No results for input(s): VITAMINB12, FOLATE, FERRITIN, TIBC, IRON, RETICCTPCT in the last 72 hours.  Coagulation profile No results for input(s): INR, PROTIME in the last 168 hours.  No results for input(s): DDIMER in the last 72 hours.  Cardiac Enzymes No results for input(s): CKMB, TROPONINI, MYOGLOBIN in the last 168 hours.  Invalid input(s): CK ------------------------------------------------------------------------------------------------------------------    Component Value Date/Time   BNP 58.7 08/02/2019 0220   BNP 33.5 09/16/2015 1139    Inpatient Medications  Scheduled Meds: . vitamin C  500 mg Oral Daily  . feeding supplement (ENSURE ENLIVE)  237 mL Oral BID BM  . heparin  5,000 Units Subcutaneous Q8H  . sodium chloride flush  3 mL Intravenous Q12H  . zinc sulfate  220 mg Oral Daily   Continuous Infusions: . sodium chloride     PRN Meds:.sodium chloride, sodium chloride flush  Micro Results Recent Results (from the past 240 hour(s))  Urine culture     Status: Abnormal   Collection Time: 08/06/19  3:27 PM   Specimen: Urine, Random  Result Value Ref Range Status   Specimen Description URINE, RANDOM  Final   Special Requests NONE  Final   Culture (A)  Final    <10,000 COLONIES/mL INSIGNIFICANT GROWTH Performed at [facility], [address], [place], [place] [address]    Report Status 08/07/2019 FINAL  Final  MRSA PCR Screening     Status: None   Collection Time: 08/09/19 11:30 AM  Result Value Ref Range Status   MRSA by PCR NEGATIVE NEGATIVE Final    Comment:        The GeneXpert MRSA Assay (FDA approved for NASAL specimens only), is one component of a comprehensive MRSA colonization surveillance program. It is not intended to diagnose MRSA infection nor to guide or monitor treatment for MRSA  infections. Performed at [facility], [address] [place], [place] [address]     Radiology Reports CT Head Wo Contrast  Result Date: 08/07/2019 CLINICAL DATA:  Altered mental status, COVID-19 diagnosis 1.5 weeks prior with admission for delirium on 08/06/2019 EXAM: CT HEAD WITHOUT CONTRAST TECHNIQUE: Contiguous axial images were obtained from the base of the skull through the vertex without intravenous contrast. COMPARISON:  CT head 08/06/2019, 05/04/2016 FINDINGS: Brain: Image quality is degraded due to patient motion. Redemonstration of a region of gliosis in the left occipital lobe which is unchanged from 1 day prior but new since 2017 comparisons. Overall finding favors a remote interval infarct but remains grossly age indeterminate. Prior lacunar infarcts are noted in the bilateral basal ganglia as is some stable senescent mineralization. No other sites concerning for acute infarction. No visible hemorrhage, hydrocephalus, extra-axial collection or mass lesion/mass effect. Symmetric moderate prominence of the ventricles, cisterns and sulci compatible with parenchymal volume loss. Confluent areas of white matter hypoattenuation are most compatible with chronic microvascular angiopathy. Vascular:  Atherosclerotic calcification of the carotid siphons and intradural vertebral arteries. No hyperdense vessel. Skull: No calvarial fracture or suspicious osseous lesion. No scalp swelling or hematoma. Sinuses/Orbits: Paranasal sinuses and mastoid air cells are predominantly clear. Debris in the left external auditory canal. Orbital structures are unremarkable aside from prior lens extractions. Other: None IMPRESSION: Image quality degraded due to motion artifact. Redemonstration of a region of gliosis in the left occipital lobe which is unchanged from 1 day prior but new since 2017 comparisons. Overall finding favors a remote interval infarct but remains grossly age indeterminate. If there  is concern that this may be subacute, consider MR for further evaluation though only if patient is able to remain still for the procedure. Remote bilateral basal ganglia lacunar infarcts. Extensive chronic microvascular angiopathy and moderate parenchymal volume loss, similar to priors. Intracranial atherosclerosis. Debris in the left external auditory canal, correlate for cerumen impaction. Electronically Signed   By: Kreg Shropshire M.D.   On: 08/07/2019 23:41   CT Head Wo Contrast  Result Date: 08/06/2019 CLINICAL DATA:  Encephalopathy EXAM: CT HEAD WITHOUT CONTRAST CT CERVICAL SPINE WITHOUT CONTRAST TECHNIQUE: Multidetector CT imaging of the head and cervical spine was performed following the standard protocol without intravenous contrast. Multiplanar CT image reconstructions of the cervical spine were also generated. COMPARISON:  CT 05/04/2016 FINDINGS: CT HEAD FINDINGS Brain: Motion degradation of the exam. Repeat exam with some improvement. Hypodensities within the LEFT and RIGHT external capsule is consistent remote white matter infarctions. Focal encephalomalacia within the LEFT occipital lobe is new from comparison exam 2017 (image 13/30. No clear acute cortical Concern cortical atrophy. Generalized subcortical white matter hypodensities. No extra-axial collections Vascular: No hyperdense vessel or unexpected calcification. Skull: Normal. Negative for fracture or focal lesion. Sinuses/Orbits: Paranasal sinuses and mastoid air cells are clear. Orbits are clear. Other: None. CT CERVICAL SPINE FINDINGS Alignment: Normal alignment of the cervical vertebral bodies. Skull base and vertebrae: Normal craniocervical junction. No loss of vertebral body height or disc height. Normal facet articulation. No evidence of fracture. Soft tissues and spinal canal: No prevertebral soft tissue swelling. No perispinal or epidural hematoma. Disc levels: Endplate spurring and disc space narrowing from C4 to the C7. No acute  findings Upper chest: Clear Other: None IMPRESSION: 1. No acute intracranial findings. 2. Remote deep white matter external capsule infarctions. 3. Probable remote LEFT occipital lobe infarction. 4. Generalized atrophy and white matter microvascular disease. 5. No cervical spine fracture. 6. Multilevel disc osteophytic disease. Electronically Signed   By: Genevive Bi M.D.   On: 08/06/2019 14:41   CT Cervical Spine Wo Contrast  Result Date: 08/06/2019 CLINICAL DATA:  Encephalopathy EXAM: CT HEAD WITHOUT CONTRAST CT CERVICAL SPINE WITHOUT CONTRAST TECHNIQUE: Multidetector CT imaging of the head and cervical spine was performed following the standard protocol without intravenous contrast. Multiplanar CT image reconstructions of the cervical spine were also generated. COMPARISON:  CT 05/04/2016 FINDINGS: CT HEAD FINDINGS Brain: Motion degradation of the exam. Repeat exam with some improvement. Hypodensities within the LEFT and RIGHT external capsule is consistent remote white matter infarctions. Focal encephalomalacia within the LEFT occipital lobe is new from comparison exam 2017 (image 13/30. No clear acute cortical Concern cortical atrophy. Generalized subcortical white matter hypodensities. No extra-axial collections Vascular: No hyperdense vessel or unexpected calcification. Skull: Normal. Negative for fracture or focal lesion. Sinuses/Orbits: Paranasal sinuses and mastoid air cells are clear. Orbits are clear. Other: None. CT CERVICAL SPINE FINDINGS Alignment: Normal alignment of the cervical vertebral bodies. Skull  base and vertebrae: Normal craniocervical junction. No loss of vertebral body height or disc height. Normal facet articulation. No evidence of fracture. Soft tissues and spinal canal: No prevertebral soft tissue swelling. No perispinal or epidural hematoma. Disc levels: Endplate spurring and disc space narrowing from C4 to the C7. No acute findings Upper chest: Clear Other: None IMPRESSION: 1.  No acute intracranial findings. 2. Remote deep white matter external capsule infarctions. 3. Probable remote LEFT occipital lobe infarction. 4. Generalized atrophy and white matter microvascular disease. 5. No cervical spine fracture. 6. Multilevel disc osteophytic disease. Electronically Signed   By: Suzy Bouchard M.D.   On: 08/06/2019 14:41   DG Chest Port 1 View  Result Date: 07/31/2019 CLINICAL DATA:  Diagnosed as COVID-19 positive on 07/28/2019, increased weakness, decreased appetite, dehydration for 3 days, history Alzheimer's, stroke, coronary artery disease post MI, CHF, ischemic cardiomyopathy, hypertension EXAM: PORTABLE CHEST 1 VIEW COMPARISON:  Portable exam 1722 hours compared to 11/10/2014 FINDINGS: Upper normal heart size. Mediastinal contours and pulmonary vascularity normal. Atherosclerotic calcification aorta. Patchy infiltrates identified LEFT upper lobe and RIGHT base consistent with multifocal pneumonia. No pleural effusion or pneumothorax. Bones demineralized. IMPRESSION: Patchy infiltrates LEFT upper lobe and RIGHT base consistent with multifocal pneumonia. Electronically Signed   By: Lavonia Dana M.D.   On: 07/31/2019 17:59     Phillips Climes M.D on 05-31-2020 at 2:30 PM  Between 7am to 7pm - Pager - [phone]  After 7pm go to [email or web address] - password [facility]  [facility] -  Office  [phone]

## 2019-08-11 NOTE — TOC Initial Note (Signed)
Transition of Care [facility]) - Initial/Assessment Note    Patient Details  Name: [name] MRN: [id number] Date of Birth: [birth year]  [facility]) CM/SW Contact:    Maryclare Labrador, RN Phone Number: August 10, 202021, 10:51 AM  Clinical Narrative:   Pt is extremely HOH  - CM unable to conduct assessemnt with pt via phone.  CM was able to reach pts daughter.  CM explained the discharge dispositions available to pt and the also communicated the recommendation of SNF.  CM read aloud PT/OT notes to daughter.  Daughter in agreement for pt to discharge to SNF.  CM explained process to daughter.  CM completed FL2 (updated PASRR to provide dementia dx).  CM faxed pt out to local SNFs.                Expected Discharge Plan: Skilled Nursing Facility Barriers to Discharge: Continued Medical Work up   Patient Goals and CMS Choice     Choice offered to / list presented to : Adult Children  Expected Discharge Plan and Services Expected Discharge Plan: [place] Acute Care Choice: [place] arrangements for the past 2 months: [place]                                      Prior Living Arrangements/Services Living arrangements for the past 2 months: [place] Lives with:: Self Patient language and need for interpreter reviewed:: Yes        Need for Family Participation in Patient Care: Yes (Comment) Care giver support system in place?: Yes (comment)   Criminal Activity/Legal Involvement Pertinent to Current Situation/Hospitalization: No - Comment as needed  Activities of Daily Living Home Assistive Devices/Equipment: None ADL Screening (condition at time of admission) Patient's cognitive ability adequate to safely complete daily activities?: No Is the patient deaf or have difficulty hearing?: Yes Does the patient have difficulty seeing, even when wearing glasses/contacts?: No Does  the patient have difficulty concentrating, remembering, or making decisions?: Yes Patient able to express need for assistance with ADLs?: Yes Does the patient have difficulty dressing or bathing?: Yes Independently performs ADLs?: No Does the patient have difficulty walking or climbing stairs?: Yes Weakness of Legs: None Weakness of Arms/Hands: None  Permission Sought/Granted   Permission granted to share information with : Yes, Verbal Permission Granted     Permission granted to share info w AGENCY: Daughter provided permission to fax pt out to SNF facilities        Emotional Assessment       Orientation: : Oriented to Self   Psych Involvement: No (comment)  Admission diagnosis:  Dehydration [E86.0] AKI (acute kidney injury) ([place]) [N17.9] Fall, initial encounter [W19.XXXA] COVID-19 [U07.1] Patient Active Problem List   Diagnosis Date Noted  . FTT (failure to thrive) in adult 08/08/2019  . Palliative care encounter   . Acute hypoxemic respiratory failure due to COVID-19 ([place]) 07/31/2019  . Advanced age 86/20/2021  . AKI (acute kidney injury) ([place]) 07/31/2019  . Memory deficit 06/06/2017  . Abnormal EKG 06/06/2017  . Recurrent falls 06/05/2017  . Diarrhea 04/20/2017  . Colitis 04/20/2017  . HFrEF (heart failure with reduced ejection fraction) ([place]) 04/20/2017  . Hypokalemia 04/20/2017  . Elevated bilirubin 04/20/2017  . CVA (cerebral infarction) 08/04/2011  . Ischemic cardiomyopathy   . Hypothyroidism   . Hyperlipemia  PCP:  Burton Apley, MD Pharmacy:   [facility] [address], [place] - [address] [place] [address] Phone: [phone] Fax: [phone]     Social Determinants of Health (SDOH) Interventions    Readmission Risk Interventions No flowsheet data found.

## 2019-08-12 LAB — COMPREHENSIVE METABOLIC PANEL
ALT: 18 U/L (ref 0–44)
AST: 20 U/L (ref 15–41)
Albumin: 3.2 g/dL — ABNORMAL LOW (ref 3.5–5.0)
Alkaline Phosphatase: 41 U/L (ref 38–126)
Anion gap: 8 (ref 5–15)
BUN: 31 mg/dL — ABNORMAL HIGH (ref 8–23)
CO2: 23 mmol/L (ref 22–32)
Calcium: 8.7 mg/dL — ABNORMAL LOW (ref 8.9–10.3)
Chloride: 111 mmol/L (ref 98–111)
Creatinine, Ser: 1.26 mg/dL — ABNORMAL HIGH (ref 0.44–1.00)
GFR calc Af Amer: 40 mL/min — ABNORMAL LOW (ref >60)
GFR calc non Af Amer: 35 mL/min — ABNORMAL LOW (ref >60)
Glucose, Bld: 77 mg/dL (ref 70–99)
Potassium: 3.7 mmol/L (ref 3.5–5.1)
Sodium: 142 mmol/L (ref 135–145)
Total Bilirubin: 1.9 mg/dL — ABNORMAL HIGH (ref 0.3–1.2)
Total Protein: 5.4 g/dL — ABNORMAL LOW (ref 6.5–8.1)

## 2019-08-12 LAB — TROPONIN I (HIGH SENSITIVITY)
Troponin I (High Sensitivity): 27 ng/L — ABNORMAL HIGH (ref <18)
Troponin I (High Sensitivity): 26 ng/L — ABNORMAL HIGH (ref <18)

## 2019-08-12 MED ORDER — ALUM & MAG HYDROXIDE-SIMETH 200-200-20 MG/5ML PO SUSP
30.0000 mL | Freq: Four times a day (QID) | ORAL | Status: DC | PRN
  Administered 2019-08-12: 30 mL via ORAL
  Filled 2019-08-12: qty 30

## 2019-08-12 MED ORDER — ASPIRIN EC 81 MG PO TBEC
81.0000 mg | DELAYED_RELEASE_TABLET | Freq: Once | ORAL | Status: AC
  Administered 2019-08-12: 13:00:00 81 mg via ORAL
  Filled 2019-08-12: qty 1

## 2019-08-12 NOTE — TOC Progression Note (Signed)
Transition of Care [facility]) - Progression Note    Patient Details  Name: [name] MRN: [id number] Date of Birth: 01-08-1917  Transition of Care [facility]) CM/SW Contact  Armanda Heritage, RN Phone Number: 08/12/2019, 12:54 PM  Clinical Narrative:    CM followed up with daughter, who reports they select [facility] in [place]. Facility rep notified of selection, facility will have bed available tomorrow for admission.    Expected Discharge Plan: Skilled Nursing Facility Barriers to Discharge: Continued Medical Work up  Expected Discharge Plan and Services Expected Discharge Plan: Skilled Nursing Facility     Post Acute Care Choice: Skilled Nursing Facility Living arrangements for the past 2 months: Independent Living Facility                                       Social Determinants of Health (SDOH) Interventions    Readmission Risk Interventions No flowsheet data found.

## 2019-08-12 NOTE — Progress Notes (Addendum)
PROGRESS NOTE                                                                                                                                                                                                             Patient Demographics:    [name], is a 86 y.o. female, DOB - Jul 02, 1917, WYO:[id number]  Admit date - 08/07/2019   Admitting Physician Haydee Monica, MD  Outpatient Primary MD for the patient is Burton Apley, MD  LOS - 4   Chief Complaint  Patient presents with  . Fall       Brief Narrative     86 y.o. female with medical history significant of dementia, hypertension, prior CVA sent in from her nursing home because of frequent falls and not eating and drinking very well.  Patient cannot provide any history secondary to her dementia.  She has been more confused than normal.  She is recently had Covid and was treated with remdesivir and Decadron discharged on August 05, 2019.  Patient today found to have acute kidney injury with a creatinine bump up to 1.8.  O2 sats are normal on room air.  Patient be referred for admission for dehydration in the setting of continued weakness after Covid infection.   Subjective:    [name] with no significant events overnight as discussed with staff, she did report some stomach ache earlier today to staff, she did good with her breakfast, did finish 50% of her breakfast, drank all her milk, and had few sips of chocolate shake.   Assessment  & Plan :    Principal Problem:   AKI (acute kidney injury) (HCC) Active Problems:   Memory deficit   Acute hypoxemic respiratory failure due to COVID-19 (HCC)   FTT (failure to thrive) in adult   AKI/failure to thrive -This is most likely due to volume depletion/dehydration, from poor oral intake and failure to thrive post Covid infection. -AKI has resolved with IV fluids, will hold on further IV fluids with known EF 20 to 25%  . -Discussed with staff oral intake is improving, discussed with staff, they have been encouraging her to increase her fluid intake, her appetite has been susceptible as well .  History of dementia -Known baseline of dementia, but she has been more confused initially, this has improved, appears mentation back to  baseline.  COVID  19 infection -Patient currently with no hypoxia, she already finished her treatment with steroids and remdesivir last admission.  Chronic systolic/diastolic CHF -Currently appears to be dry, but will need to monitor closely given her history of CHF with known EF 20 to 25% in 2012.  Hypothyroidism -Continue with Synthroid  History of CVA -Continue with a statin  Patient had a complaint of stomach ache earlier today, EKG no changes(bundle branch block) troponin not concerning, she received aspirin, I have ordered Maalox as well.  COVID-19 Labs  No results for input(s): DDIMER, FERRITIN, LDH, CRP in the last 72 hours.  No results found for: SARSCOV2NAA   Code Status : DNR  Family communication: Discussed with daughter 1/29  Disposition Plan  : Back to SNF  Barriers For Discharge : SNF placement in a.m. when bed is available  Consults  :  none  Procedures  : none  DVT Prophylaxis  : [place] heparin  Lab Results  Component Value Date   PLT 132 (L) 08/09/2019    Antibiotics  :    Anti-infectives (From admission, onward)   None        Objective:   Vitals:   08/12/19 0403 08/12/19 0539 08/12/19 0650 08/12/19 0723  BP: 108/63   116/75  Pulse: 69 66 61 64  Resp: 20   17  Temp: 98.6 F (37 C)   98.1 F (36.7 C)  TempSrc: Oral   Oral  SpO2: 98% 95% 99% 100%  Weight:      Height:        Wt Readings from Last 3 Encounters:  08/08/19 52.2 kg  07/31/19 55.8 kg  09/16/18 56.7 kg     Intake/Output Summary (Last 24 hours) at 08/12/2019 1441 Last data filed at 08/12/2019 1110 Gross per 24 hour  Intake 413 ml  Output 700 ml  Net -287 ml      Physical Exam . sitting comfortably, open her eyes, follows simple commands. Symmetrical Chest wall movement, Good air movement bilaterally, CTAB RRR,No Gallops,Rubs or new Murmurs, No Parasternal Heave +ve B.Sounds, Abd Soft, No tenderness, No rebound - guarding or rigidity. No Cyanosis, Clubbing or edema, No new Rash or bruise        Data Review:    CBC Recent Labs  Lab 08/06/19 1323 08/07/19 2254 08/09/19 0218  WBC 9.7 11.0* 7.5  HGB 13.5 13.5 10.7*  HCT 41.8 42.6 32.9*  PLT 178 185 132*  MCV 93.7 95.3 94.8  MCH 30.3 30.2 30.8  MCHC 32.3 31.7 32.5  RDW 13.4 13.6 13.8  LYMPHSABS 3.5 4.5* 3.2  MONOABS 0.7 0.9 0.7  EOSABS 0.0 0.1 0.2  BASOSABS 0.0 0.1 0.0    Chemistries  Recent Labs  Lab 08/07/19 2254 08/09/19 0218 08/10/19 0053 08/11/19 0105 08/12/19 0237  NA 143 143 144 143 142  K 4.2 3.5 3.6 3.7 3.7  CL 106 111 113* 112* 111  CO2 23 23 24 22 23   GLUCOSE 120* 87 81 88 77  BUN 54* 50* 37* 31* 31*  CREATININE 1.82* 1.47* 1.22* 1.17* 1.26*  CALCIUM 9.2 8.3* 8.4* 8.6* 8.7*  AST 22 19 20 19 20   ALT 21 15 16 17 18   ALKPHOS 49 37* 38 39 41  BILITOT 2.2* 2.1* 2.0* 1.7* 1.9*   ------------------------------------------------------------------------------------------------------------------ No results for input(s): CHOL, HDL, LDLCALC, TRIG, CHOLHDL, LDLDIRECT in the last 72 hours.  Lab Results  Component Value Date   HGBA1C  12/20/2007    5.7 (NOTE)  The ADA recommends the following therapeutic goals for glycemic   control related to Hgb A1C measurement:   Goal of Therapy:   < 7.0% Hgb A1C   Action Suggested:  > 8.0% Hgb A1C   Ref:  Diabetes Care, 22, Suppl. 1, 1999   ------------------------------------------------------------------------------------------------------------------ No results for input(s): TSH, T4TOTAL, T3FREE, THYROIDAB in the last 72 hours.  Invalid input(s):  FREET3 ------------------------------------------------------------------------------------------------------------------ No results for input(s): VITAMINB12, FOLATE, FERRITIN, TIBC, IRON, RETICCTPCT in the last 72 hours.  Coagulation profile No results for input(s): INR, PROTIME in the last 168 hours.  No results for input(s): DDIMER in the last 72 hours.  Cardiac Enzymes No results for input(s): CKMB, TROPONINI, MYOGLOBIN in the last 168 hours.  Invalid input(s): CK ------------------------------------------------------------------------------------------------------------------    Component Value Date/Time   BNP 58.7 08/02/2019 0220   BNP 33.5 09/16/2015 1139    Inpatient Medications  Scheduled Meds: . vitamin C  500 mg Oral Daily  . feeding supplement (ENSURE ENLIVE)  237 mL Oral BID BM  . heparin  5,000 Units Subcutaneous Q8H  . sodium chloride flush  3 mL Intravenous Q12H  . zinc sulfate  220 mg Oral Daily   Continuous Infusions: . sodium chloride     PRN Meds:.sodium chloride, alum & mag hydroxide-simeth, sodium chloride flush  Micro Results Recent Results (from the past 240 hour(s))  Urine culture     Status: Abnormal   Collection Time: 08/06/19  3:27 PM   Specimen: Urine, Random  Result Value Ref Range Status   Specimen Description URINE, RANDOM  Final   Special Requests NONE  Final   Culture (A)  Final    <10,000 COLONIES/mL INSIGNIFICANT GROWTH Performed at [facility], [address], [place], [place] [address]    Report Status 08/07/2019 FINAL  Final  MRSA PCR Screening     Status: None   Collection Time: 08/09/19 11:30 AM  Result Value Ref Range Status   MRSA by PCR NEGATIVE NEGATIVE Final    Comment:        The GeneXpert MRSA Assay (FDA approved for NASAL specimens only), is one component of a comprehensive MRSA colonization surveillance program. It is not intended to diagnose MRSA infection nor to guide or monitor treatment for MRSA  infections. Performed at [facility], [address], [place], [place] [address]     Radiology Reports CT Head Wo Contrast  Result Date: 08/07/2019 CLINICAL DATA:  Altered mental status, COVID-19 diagnosis 1.5 weeks prior with admission for delirium on 08/06/2019 EXAM: CT HEAD WITHOUT CONTRAST TECHNIQUE: Contiguous axial images were obtained from the base of the skull through the vertex without intravenous contrast. COMPARISON:  CT head 08/06/2019, 05/04/2016 FINDINGS: Brain: Image quality is degraded due to patient motion. Redemonstration of a region of gliosis in the left occipital lobe which is unchanged from 1 day prior but new since 2017 comparisons. Overall finding favors a remote interval infarct but remains grossly age indeterminate. Prior lacunar infarcts are noted in the bilateral basal ganglia as is some stable senescent mineralization. No other sites concerning for acute infarction. No visible hemorrhage, hydrocephalus, extra-axial collection or mass lesion/mass effect. Symmetric moderate prominence of the ventricles, cisterns and sulci compatible with parenchymal volume loss. Confluent areas of white matter hypoattenuation are most compatible with chronic microvascular angiopathy. Vascular: Atherosclerotic calcification of the carotid siphons and intradural vertebral arteries. No hyperdense vessel. Skull: No calvarial fracture or suspicious osseous lesion. No scalp swelling or hematoma. Sinuses/Orbits: Paranasal sinuses and  mastoid air cells are predominantly clear. Debris in the left external auditory canal. Orbital structures are unremarkable aside from prior lens extractions. Other: None IMPRESSION: Image quality degraded due to motion artifact. Redemonstration of a region of gliosis in the left occipital lobe which is unchanged from 1 day prior but new since 2017 comparisons. Overall finding favors a remote interval infarct but remains grossly age indeterminate. If there  is concern that this may be subacute, consider MR for further evaluation though only if patient is able to remain still for the procedure. Remote bilateral basal ganglia lacunar infarcts. Extensive chronic microvascular angiopathy and moderate parenchymal volume loss, similar to priors. Intracranial atherosclerosis. Debris in the left external auditory canal, correlate for cerumen impaction. Electronically Signed   By: Kreg ShropshirePrice  DeHay M.D.   On: 08/07/2019 23:41   CT Head Wo Contrast  Result Date: 08/06/2019 CLINICAL DATA:  Encephalopathy EXAM: CT HEAD WITHOUT CONTRAST CT CERVICAL SPINE WITHOUT CONTRAST TECHNIQUE: Multidetector CT imaging of the head and cervical spine was performed following the standard protocol without intravenous contrast. Multiplanar CT image reconstructions of the cervical spine were also generated. COMPARISON:  CT 05/04/2016 FINDINGS: CT HEAD FINDINGS Brain: Motion degradation of the exam. Repeat exam with some improvement. Hypodensities within the LEFT and RIGHT external capsule is consistent remote white matter infarctions. Focal encephalomalacia within the LEFT occipital lobe is new from comparison exam 2017 (image 13/30. No clear acute cortical Concern cortical atrophy. Generalized subcortical white matter hypodensities. No extra-axial collections Vascular: No hyperdense vessel or unexpected calcification. Skull: Normal. Negative for fracture or focal lesion. Sinuses/Orbits: Paranasal sinuses and mastoid air cells are clear. Orbits are clear. Other: None. CT CERVICAL SPINE FINDINGS Alignment: Normal alignment of the cervical vertebral bodies. Skull base and vertebrae: Normal craniocervical junction. No loss of vertebral body height or disc height. Normal facet articulation. No evidence of fracture. Soft tissues and spinal canal: No prevertebral soft tissue swelling. No perispinal or epidural hematoma. Disc levels: Endplate spurring and disc space narrowing from C4 to the C7. No acute  findings Upper chest: Clear Other: None IMPRESSION: 1. No acute intracranial findings. 2. Remote deep white matter external capsule infarctions. 3. Probable remote LEFT occipital lobe infarction. 4. Generalized atrophy and white matter microvascular disease. 5. No cervical spine fracture. 6. Multilevel disc osteophytic disease. Electronically Signed   By: Genevive BiStewart  Edmunds M.D.   On: 08/06/2019 14:41   CT Cervical Spine Wo Contrast  Result Date: 08/06/2019 CLINICAL DATA:  Encephalopathy EXAM: CT HEAD WITHOUT CONTRAST CT CERVICAL SPINE WITHOUT CONTRAST TECHNIQUE: Multidetector CT imaging of the head and cervical spine was performed following the standard protocol without intravenous contrast. Multiplanar CT image reconstructions of the cervical spine were also generated. COMPARISON:  CT 05/04/2016 FINDINGS: CT HEAD FINDINGS Brain: Motion degradation of the exam. Repeat exam with some improvement. Hypodensities within the LEFT and RIGHT external capsule is consistent remote white matter infarctions. Focal encephalomalacia within the LEFT occipital lobe is new from comparison exam 2017 (image 13/30. No clear acute cortical Concern cortical atrophy. Generalized subcortical white matter hypodensities. No extra-axial collections Vascular: No hyperdense vessel or unexpected calcification. Skull: Normal. Negative for fracture or focal lesion. Sinuses/Orbits: Paranasal sinuses and mastoid air cells are clear. Orbits are clear. Other: None. CT CERVICAL SPINE FINDINGS Alignment: Normal alignment of the cervical vertebral bodies. Skull base and vertebrae: Normal craniocervical junction. No loss of vertebral body height or disc height. Normal facet articulation. No evidence of fracture. Soft tissues and spinal canal: No prevertebral soft  tissue swelling. No perispinal or epidural hematoma. Disc levels: Endplate spurring and disc space narrowing from C4 to the C7. No acute findings Upper chest: Clear Other: None IMPRESSION: 1.  No acute intracranial findings. 2. Remote deep white matter external capsule infarctions. 3. Probable remote LEFT occipital lobe infarction. 4. Generalized atrophy and white matter microvascular disease. 5. No cervical spine fracture. 6. Multilevel disc osteophytic disease. Electronically Signed   By: Suzy Bouchard M.D.   On: 08/06/2019 14:41   DG Chest Port 1 View  Result Date: 07/31/2019 CLINICAL DATA:  Diagnosed as COVID-19 positive on 07/28/2019, increased weakness, decreased appetite, dehydration for 3 days, history Alzheimer's, stroke, coronary artery disease post MI, CHF, ischemic cardiomyopathy, hypertension EXAM: PORTABLE CHEST 1 VIEW COMPARISON:  Portable exam 1722 hours compared to 11/10/2014 FINDINGS: Upper normal heart size. Mediastinal contours and pulmonary vascularity normal. Atherosclerotic calcification aorta. Patchy infiltrates identified LEFT upper lobe and RIGHT base consistent with multifocal pneumonia. No pleural effusion or pneumothorax. Bones demineralized. IMPRESSION: Patchy infiltrates LEFT upper lobe and RIGHT base consistent with multifocal pneumonia. Electronically Signed   By: Lavonia Dana M.D.   On: 07/31/2019 17:59     Phillips Climes M.D on 08/12/2019 at 2:41 PM  Between 7am to 7pm - Pager - [phone]  After 7pm go to [email or web address] - password [facility]  [facility] -  Office  [phone]

## 2019-08-12 NOTE — TOC Progression Note (Signed)
Transition of Care [facility]) - Progression Note    Patient Details  Name: [name] MRN: [id number] Date of Birth: [birth year]  [facility] Care [facility]) CM/SW Contact  Armanda Heritage, RN Phone Number: 08/12/2019, 11:08 AM  Clinical Narrative:    CM provided patient's daughter with bed offers. Daughter requests time to research the facilities and speak with her sister.  CM will follow-up in am for final choice.     Expected Discharge Plan: Skilled Nursing Facility Barriers to Discharge: Continued Medical Work up  Expected Discharge Plan and Services Expected Discharge Plan: Skilled Nursing Facility     Post Acute Care Choice: Skilled Nursing Facility Living arrangements for the past 2 months: Independent Living Facility                                       Social Determinants of Health (SDOH) Interventions    Readmission Risk Interventions No flowsheet data found.

## 2019-08-12 NOTE — Plan of Care (Signed)

## 2019-08-13 LAB — COMPREHENSIVE METABOLIC PANEL
ALT: 19 U/L (ref 0–44)
AST: 25 U/L (ref 15–41)
Albumin: 3.1 g/dL — ABNORMAL LOW (ref 3.5–5.0)
Alkaline Phosphatase: 40 U/L (ref 38–126)
Anion gap: 7 (ref 5–15)
BUN: 33 mg/dL — ABNORMAL HIGH (ref 8–23)
CO2: 25 mmol/L (ref 22–32)
Calcium: 8.8 mg/dL — ABNORMAL LOW (ref 8.9–10.3)
Chloride: 111 mmol/L (ref 98–111)
Creatinine, Ser: 1.26 mg/dL — ABNORMAL HIGH (ref 0.44–1.00)
GFR calc Af Amer: 40 mL/min — ABNORMAL LOW (ref >60)
GFR calc non Af Amer: 35 mL/min — ABNORMAL LOW (ref >60)
Glucose, Bld: 89 mg/dL (ref 70–99)
Potassium: 3.9 mmol/L (ref 3.5–5.1)
Sodium: 143 mmol/L (ref 135–145)
Total Bilirubin: 1.4 mg/dL — ABNORMAL HIGH (ref 0.3–1.2)
Total Protein: 5.3 g/dL — ABNORMAL LOW (ref 6.5–8.1)

## 2019-08-13 MED ORDER — SODIUM CHLORIDE 0.9 % IV SOLN: INTRAVENOUS | Status: AC

## 2019-08-13 NOTE — TOC Progression Note (Signed)
Transition of Care [facility]) - Progression Note    Patient Details  Name: [name] MRN: [id number] Date of Birth: [birth year]  Transition [facility] Care [facility]) CM/SW Contact  Armanda Heritage, RN Phone Number: 08/13/2019, 12:16 PM  Clinical Narrative:   [facility] stating there is no bed available today.     Expected Discharge Plan: Skilled Nursing Facility Barriers to Discharge: Continued Medical Work up  Expected Discharge Plan and Services Expected Discharge Plan: Skilled Nursing Facility     Post Acute Care Choice: Skilled Nursing Facility Living arrangements for the past 2 months: Independent Living Facility                                       Social Determinants of Health (SDOH) Interventions    Readmission Risk Interventions No flowsheet data found.

## 2019-08-13 NOTE — Progress Notes (Signed)
PROGRESS NOTE                                                                                                                                                                                                             Patient Demographics:    [name], is a 86 y.o. female, DOB - 05/06/1917, JQB:[id number]  Admit date - 08/07/2019   Admitting Physician Tanya Monica, MD  Outpatient Primary MD for the patient is Tanya Apley, MD  LOS - 5   Chief Complaint  Patient presents with  . Fall       Brief Narrative     86 y.o. female with medical history significant of dementia, hypertension, prior CVA sent in from her nursing home because of frequent falls and not eating and drinking very well.  Patient cannot provide any history secondary to her dementia.  She has been more confused than normal.  She is recently had Covid and was treated with remdesivir and Decadron discharged on August 05, 2019.  Patient today found to have acute kidney injury with a creatinine bump up to 1.8.  O2 sats are normal on room air.  Patient be referred for admission for dehydration in the setting of continued weakness after Covid infection.   Subjective:    [name] with no significant events overnight as discussed with staff, she did report some stomach ache earlier today to staff, she did good with her breakfast, did finish 50% of her breakfast, drank all her milk, and had few sips of chocolate shake.   Assessment  & Plan :    Principal Problem:   AKI (acute kidney injury) (HCC) Active Problems:   Memory deficit   Acute hypoxemic respiratory failure due to COVID-19 (HCC)   FTT (failure to thrive) in adult   AKI/failure to thrive -This is most likely due to volume depletion/dehydration, from poor oral intake and failure to thrive post Covid infection. -AKI has resolved with IV fluids, will hold on further IV fluids with known EF 20 to 25%  . -Discussed with staff oral intake is improving, discussed with staff, they have been encouraging her to increase her fluid intake, her appetite has been susceptible as well .  History of dementia -Known baseline of dementia, but she has been more confused initially, this has improved, appears mentation back to  baseline.  COVID  19 infection -Patient currently with no hypoxia, she already finished her treatment with steroids and remdesivir last admission.  Chronic systolic/diastolic CHF -Currently appears to be dry, but will need to monitor closely given her history of CHF with known EF 20 to 25% in 2012.  Hypothyroidism -Continue with Synthroid  History of CVA -Continue with a statin  Patient had a complaint of stomach ache earlier today, EKG no changes(bundle branch block) troponin not concerning, she received aspirin, I have ordered Maalox as well.  COVID-19 Labs  No results for input(s): DDIMER, FERRITIN, LDH, CRP in the last 72 hours.  No results found for: SARSCOV2NAA   Code Status : DNR  Family communication: Discussed with daughter 2/2  Disposition Plan  : Back to SNF  Barriers For Discharge : SNF placement tomorrow when bed is available at [facility]  Consults  :  none  Procedures  : none  DVT Prophylaxis  : [place] heparin  Lab Results  Component Value Date   PLT 132 (L) 08/09/2019    Antibiotics  :    Anti-infectives (From admission, onward)   None        Objective:   Vitals:   08/13/19 0000 08/13/19 0300 08/13/19 0400 08/13/19 0726  BP: 109/60 (!) 98/56  114/62  Pulse: 74 64  62  Resp: 19 16 18 16   Temp: 98.1 F (36.7 C) (!) 97.3 F (36.3 C)  97.6 F (36.4 C)  TempSrc: Oral Oral  Oral  SpO2: 94% 94%  95%  Weight:      Height:        Wt Readings from Last 3 Encounters:  08/08/19 52.2 kg  07/31/19 55.8 kg  09/16/18 56.7 kg     Intake/Output Summary (Last 24 hours) at 08/13/2019 1424 Last data filed at 08/13/2019 1317 Gross per 24 hour   Intake 1308.95 ml  Output 0 ml  Net 1308.95 ml     Physical Exam . sitting comfortably, pleasant ,open her eyes, follows simple commands. Symmetrical Chest wall movement, Good air movement bilaterally, CTAB RRR,No Gallops,Rubs or new Murmurs, No Parasternal Heave +ve B.Sounds, Abd Soft, No tenderness, No rebound - guarding or rigidity. No Cyanosis, Clubbing or edema, No new Rash or bruise        Data Review:    CBC Recent Labs  Lab 08/07/19 2254 08/09/19 0218  WBC 11.0* 7.5  HGB 13.5 10.7*  HCT 42.6 32.9*  PLT 185 132*  MCV 95.3 94.8  MCH 30.2 30.8  MCHC 31.7 32.5  RDW 13.6 13.8  LYMPHSABS 4.5* 3.2  MONOABS 0.9 0.7  EOSABS 0.1 0.2  BASOSABS 0.1 0.0    Chemistries  Recent Labs  Lab 08/09/19 0218 08/10/19 0053 08/11/19 0105 08/12/19 0237 08/13/19 0320  NA 143 144 143 142 143  K 3.5 3.6 3.7 3.7 3.9  CL 111 113* 112* 111 111  CO2 23 24 22 23 25   GLUCOSE 87 81 88 77 89  BUN 50* 37* 31* 31* 33*  CREATININE 1.47* 1.22* 1.17* 1.26* 1.26*  CALCIUM 8.3* 8.4* 8.6* 8.7* 8.8*  AST 19 20 19 20 25   ALT 15 16 17 18 19   ALKPHOS 37* 38 39 41 40  BILITOT 2.1* 2.0* 1.7* 1.9* 1.4*   ------------------------------------------------------------------------------------------------------------------ No results for input(s): CHOL, HDL, LDLCALC, TRIG, CHOLHDL, LDLDIRECT in the last 72 hours.  Lab Results  Component Value Date   HGBA1C  12/20/2007    5.7 (NOTE)   The ADA recommends the  following therapeutic goals for glycemic   control related to Hgb A1C measurement:   Goal of Therapy:   < 7.0% Hgb A1C   Action Suggested:  > 8.0% Hgb A1C   Ref:  Diabetes Care, 22, Suppl. 1, 1999   ------------------------------------------------------------------------------------------------------------------ No results for input(s): TSH, T4TOTAL, T3FREE, THYROIDAB in the last 72 hours.  Invalid input(s):  FREET3 ------------------------------------------------------------------------------------------------------------------ No results for input(s): VITAMINB12, FOLATE, FERRITIN, TIBC, IRON, RETICCTPCT in the last 72 hours.  Coagulation profile No results for input(s): INR, PROTIME in the last 168 hours.  No results for input(s): DDIMER in the last 72 hours.  Cardiac Enzymes No results for input(s): CKMB, TROPONINI, MYOGLOBIN in the last 168 hours.  Invalid input(s): CK ------------------------------------------------------------------------------------------------------------------    Component Value Date/Time   BNP 58.7 08/02/2019 0220   BNP 33.5 09/16/2015 1139    Inpatient Medications  Scheduled Meds: . vitamin C  500 mg Oral Daily  . feeding supplement (ENSURE ENLIVE)  237 mL Oral BID BM  . heparin  5,000 Units Subcutaneous Q8H  . sodium chloride flush  3 mL Intravenous Q12H  . zinc sulfate  220 mg Oral Daily   Continuous Infusions: . sodium chloride     PRN Meds:.sodium chloride, alum & mag hydroxide-simeth, sodium chloride flush  Micro Results Recent Results (from the past 240 hour(s))  Urine culture     Status: Abnormal   Collection Time: 08/06/19  3:27 PM   Specimen: Urine, Random  Result Value Ref Range Status   Specimen Description URINE, RANDOM  Final   Special Requests NONE  Final   Culture (A)  Final    <10,000 COLONIES/mL INSIGNIFICANT GROWTH Performed at [facility], [address], [place], [place] [address]    Report Status 08/07/2019 FINAL  Final  MRSA PCR Screening     Status: None   Collection Time: 08/09/19 11:30 AM  Result Value Ref Range Status   MRSA by PCR NEGATIVE NEGATIVE Final    Comment:        The GeneXpert MRSA Assay (FDA approved for NASAL specimens only), is one component of a comprehensive MRSA colonization surveillance program. It is not intended to diagnose MRSA infection nor to guide or monitor treatment for MRSA  infections. Performed at [facility], [address] [place], [place] [address]     Radiology Reports CT Head Wo Contrast  Result Date: 08/07/2019 CLINICAL DATA:  Altered mental status, COVID-19 diagnosis 1.5 weeks prior with admission for delirium on 08/06/2019 EXAM: CT HEAD WITHOUT CONTRAST TECHNIQUE: Contiguous axial images were obtained from the base of the skull through the vertex without intravenous contrast. COMPARISON:  CT head 08/06/2019, 05/04/2016 FINDINGS: Brain: Image quality is degraded due to patient motion. Redemonstration of a region of gliosis in the left occipital lobe which is unchanged from 1 day prior but new since 2017 comparisons. Overall finding favors a remote interval infarct but remains grossly age indeterminate. Prior lacunar infarcts are noted in the bilateral basal ganglia as is some stable senescent mineralization. No other sites concerning for acute infarction. No visible hemorrhage, hydrocephalus, extra-axial collection or mass lesion/mass effect. Symmetric moderate prominence of the ventricles, cisterns and sulci compatible with parenchymal volume loss. Confluent areas of white matter hypoattenuation are most compatible with chronic microvascular angiopathy. Vascular: Atherosclerotic calcification of the carotid siphons and intradural vertebral arteries. No hyperdense vessel. Skull: No calvarial fracture or suspicious osseous lesion. No scalp swelling or hematoma. Sinuses/Orbits: Paranasal sinuses and mastoid air cells are  predominantly clear. Debris in the left external auditory canal. Orbital structures are unremarkable aside from prior lens extractions. Other: None IMPRESSION: Image quality degraded due to motion artifact. Redemonstration of a region of gliosis in the left occipital lobe which is unchanged from 1 day prior but new since 2017 comparisons. Overall finding favors a remote interval infarct but remains grossly age indeterminate. If there  is concern that this may be subacute, consider MR for further evaluation though only if patient is able to remain still for the procedure. Remote bilateral basal ganglia lacunar infarcts. Extensive chronic microvascular angiopathy and moderate parenchymal volume loss, similar to priors. Intracranial atherosclerosis. Debris in the left external auditory canal, correlate for cerumen impaction. Electronically Signed   By: Lovena Le M.D.   On: 08/07/2019 23:41   CT Head Wo Contrast  Result Date: 08/06/2019 CLINICAL DATA:  Encephalopathy EXAM: CT HEAD WITHOUT CONTRAST CT CERVICAL SPINE WITHOUT CONTRAST TECHNIQUE: Multidetector CT imaging of the head and cervical spine was performed following the standard protocol without intravenous contrast. Multiplanar CT image reconstructions of the cervical spine were also generated. COMPARISON:  CT 05/04/2016 FINDINGS: CT HEAD FINDINGS Brain: Motion degradation of the exam. Repeat exam with some improvement. Hypodensities within the LEFT and RIGHT external capsule is consistent remote white matter infarctions. Focal encephalomalacia within the LEFT occipital lobe is new from comparison exam 2017 (image 13/30. No clear acute cortical Concern cortical atrophy. Generalized subcortical white matter hypodensities. No extra-axial collections Vascular: No hyperdense vessel or unexpected calcification. Skull: Normal. Negative for fracture or focal lesion. Sinuses/Orbits: Paranasal sinuses and mastoid air cells are clear. Orbits are clear. Other: None. CT CERVICAL SPINE FINDINGS Alignment: Normal alignment of the cervical vertebral bodies. Skull base and vertebrae: Normal craniocervical junction. No loss of vertebral body height or disc height. Normal facet articulation. No evidence of fracture. Soft tissues and spinal canal: No prevertebral soft tissue swelling. No perispinal or epidural hematoma. Disc levels: Endplate spurring and disc space narrowing from C4 to the C7. No acute  findings Upper chest: Clear Other: None IMPRESSION: 1. No acute intracranial findings. 2. Remote deep white matter external capsule infarctions. 3. Probable remote LEFT occipital lobe infarction. 4. Generalized atrophy and white matter microvascular disease. 5. No cervical spine fracture. 6. Multilevel disc osteophytic disease. Electronically Signed   By: Suzy Bouchard M.D.   On: 08/06/2019 14:41   CT Cervical Spine Wo Contrast  Result Date: 08/06/2019 CLINICAL DATA:  Encephalopathy EXAM: CT HEAD WITHOUT CONTRAST CT CERVICAL SPINE WITHOUT CONTRAST TECHNIQUE: Multidetector CT imaging of the head and cervical spine was performed following the standard protocol without intravenous contrast. Multiplanar CT image reconstructions of the cervical spine were also generated. COMPARISON:  CT 05/04/2016 FINDINGS: CT HEAD FINDINGS Brain: Motion degradation of the exam. Repeat exam with some improvement. Hypodensities within the LEFT and RIGHT external capsule is consistent remote white matter infarctions. Focal encephalomalacia within the LEFT occipital lobe is new from comparison exam 2017 (image 13/30. No clear acute cortical Concern cortical atrophy. Generalized subcortical white matter hypodensities. No extra-axial collections Vascular: No hyperdense vessel or unexpected calcification. Skull: Normal. Negative for fracture or focal lesion. Sinuses/Orbits: Paranasal sinuses and mastoid air cells are clear. Orbits are clear. Other: None. CT CERVICAL SPINE FINDINGS Alignment: Normal alignment of the cervical vertebral bodies. Skull base and vertebrae: Normal craniocervical junction. No loss of vertebral body height or disc height. Normal facet articulation. No evidence of fracture. Soft tissues and spinal canal: No prevertebral soft tissue swelling. No perispinal  or epidural hematoma. Disc levels: Endplate spurring and disc space narrowing from C4 to the C7. No acute findings Upper chest: Clear Other: None IMPRESSION: 1.  No acute intracranial findings. 2. Remote deep white matter external capsule infarctions. 3. Probable remote LEFT occipital lobe infarction. 4. Generalized atrophy and white matter microvascular disease. 5. No cervical spine fracture. 6. Multilevel disc osteophytic disease. Electronically Signed   By: Genevive BiStewart  Edmunds M.D.   On: 08/06/2019 14:41   DG Chest Port 1 View  Result Date: 07/31/2019 CLINICAL DATA:  Diagnosed as COVID-19 positive on 07/28/2019, increased weakness, decreased appetite, dehydration for 3 days, history Alzheimer's, stroke, coronary artery disease post MI, CHF, ischemic cardiomyopathy, hypertension EXAM: PORTABLE CHEST 1 VIEW COMPARISON:  Portable exam 1722 hours compared to 11/10/2014 FINDINGS: Upper normal heart size. Mediastinal contours and pulmonary vascularity normal. Atherosclerotic calcification aorta. Patchy infiltrates identified LEFT upper lobe and RIGHT base consistent with multifocal pneumonia. No pleural effusion or pneumothorax. Bones demineralized. IMPRESSION: Patchy infiltrates LEFT upper lobe and RIGHT base consistent with multifocal pneumonia. Electronically Signed   By: Ulyses SouthwardMark  Boles M.D.   On: 07/31/2019 17:59     Huey Bienenstockawood Mckenzie Bove M.D on 08/13/2019 at 2:24 PM  Between 7am to 7pm - Pager - [phone]  After 7pm go to [email or web address] - password [email or web address]  [facility] -  Office  [phone]

## 2019-08-13 NOTE — Plan of Care (Signed)

## 2019-08-14 DIAGNOSIS — W19XXXA Unspecified fall, initial encounter: Secondary | ICD-10-CM

## 2019-08-14 DIAGNOSIS — J9601 Acute respiratory failure with hypoxia: Secondary | ICD-10-CM

## 2019-08-14 MED ORDER — FUROSEMIDE 20 MG PO TABS: 20.0000 mg | ORAL_TABLET | Freq: Every day | ORAL | 0 refills | Status: AC | PRN

## 2019-08-14 NOTE — Plan of Care (Addendum)
Attempted to call report to SNF x4 times. Receptionist transferred this nurse and unable to reach staff nurse so requested to speak with DON of facility d/t needing to give report. Transferred to DON and was also unable to reach. Charge aware of unable to reach facility for report as well. Patient IV removed without complication. D/C instructions reviewed with SNF d/t patient unable to retain education r/t confusion. Patient left via [facility] with all belongings. Notified Helmut Muster, daughter, that patient had left with transport to head to facility.  Problem: Education: Goal: Knowledge of risk factors and measures for prevention of condition will improve Outcome: Adequate for Discharge   Problem: Coping: Goal: Psychosocial and spiritual needs will be supported Outcome: Adequate for Discharge   Problem: Respiratory: Goal: Will maintain a patent airway Outcome: Adequate for Discharge Goal: Complications related to the disease process, condition or treatment will be avoided or minimized Outcome: Adequate for Discharge   Problem: Education: Goal: Knowledge of General Education information will improve Description: Including pain rating scale, medication(s)/side effects and non-pharmacologic comfort measures Outcome: Adequate for Discharge   Problem: Health Behavior/Discharge Planning: Goal: Ability to manage health-related needs will improve Outcome: Adequate for Discharge   Problem: Clinical Measurements: Goal: Ability to maintain clinical measurements within normal limits will improve Outcome: Adequate for Discharge Goal: Will remain free from infection Outcome: Adequate for Discharge Goal: Diagnostic test results will improve Outcome: Adequate for Discharge Goal: Respiratory complications will improve Outcome: Adequate for Discharge Goal: Cardiovascular complication will be avoided Outcome: Adequate for Discharge   Problem: Activity: Goal: Risk for activity intolerance will  decrease Outcome: Adequate for Discharge   Problem: Nutrition: Goal: Adequate nutrition will be maintained Outcome: Adequate for Discharge   Problem: Coping: Goal: Level of anxiety will decrease Outcome: Adequate for Discharge   Problem: Elimination: Goal: Will not experience complications related to bowel motility Outcome: Adequate for Discharge Goal: Will not experience complications related to urinary retention Outcome: Adequate for Discharge   Problem: Pain Managment: Goal: General experience of comfort will improve Outcome: Adequate for Discharge   Problem: Safety: Goal: Ability to remain free from injury will improve Outcome: Adequate for Discharge   Problem: Skin Integrity: Goal: Risk for impaired skin integrity will decrease Outcome: Adequate for Discharge

## 2019-08-14 NOTE — TOC Progression Note (Addendum)
Transition of Care [facility] - Progression Note    Patient Details  Name: [name] MRN: [id number] Date of Birth: [birth year]  [facility]) CM/SW Contact  Golda Acre, RN Phone Number: 08/14/2019, 9:38 AM  Clinical Narrative:    [facility] patient has a bed if can come today.  Dr. Elvera Lennox and floor rn notified. [facility]/NOTIFIED THAT PATIENT CAN COME TODAY. transportion med.necessity form completed and printed to floor at [facility]. [facility] transportation called at 1026.  Floor aware. Expected Discharge Plan: Skilled Nursing Facility Barriers to Discharge: Barriers Resolved  Expected Discharge Plan and Services Expected Discharge Plan: Skilled Nursing Facility     Post Acute Care Choice: Skilled Nursing Facility Living arrangements for the past 2 months: Independent Living Facility                                       Social Determinants of Health (SDOH) Interventions    Readmission Risk Interventions No flowsheet data found.

## 2019-08-14 NOTE — Discharge Summary (Signed)
Physician Discharge Summary  [name] WCH:[id number] DOB: [birth year] DOA: 08/07/2019  PCP: Burton Apley, MD  Admit date: 08/07/2019 Discharge date: 08/14/2019  Admitted From: SNF Disposition:  SNF  Recommendations for Outpatient Follow-up:  1. Follow up with PCP in 1-2 weeks 2. Due to acute kidney injury and soft blood pressures her Lasix and Nitropatch have been hold, continue to hold Nitropatch on discharge, Lasix changed to PRN, repeat renal function within 3 to 4 days  Home Health: None Equipment/Devices: None  Discharge Condition: Stable CODE STATUS: DNR Diet recommendation: Low-sodium diet  HPI: Per admitting MD, [name] is a 86 y.o. female with medical history significant of dementia, hypertension, prior CVA sent in from her nursing home because of frequent falls and not eating and drinking very well.  Patient cannot provide any history secondary to her dementia.  She has been more confused than normal.  Today patient found to have acute kidney injury.  She is recently had Covid and was treated with remdesivir and Decadron discharged on August 05, 2019.  Patient today found to have acute kidney injury with a creatinine bump up to 1.8.  O2 sats are normal on room air.  Patient be referred for admission for dehydration in the setting of continued weakness after Covid infection.  Hospital Course / Discharge diagnoses: AKI/failure to thrive -This is most likely due to volume depletion/dehydration, from poor oral intake and failure to thrive post Covid infection. AKI has resolved with IV fluids, will hold on further IV fluids with known EF 20 to 25%.  She is also on standing Lasix at home, changed to PRN, please monitor volume status for lower extremity swelling.  Repeat renal function in 3 to 5 days, may need to go back on Lasix daily after evaluated by PCP  History of dementia -Known baseline of dementia, but she has been more confused initially, this has  improved, appears mentation back to baseline. COVID 19 infection -Patient currently with no hypoxia, she already finished her treatment with steroids and remdesivir last admission. Chronic systolic/diastolic CHF -Currently appears to be dry, but will need to monitor closely given her history of CHF with known EF 20 to 25% in 2012. Hypothyroidism -Continue with Synthroid History of CVA -Continue with a statin   Discharge Instructions   Allergies as of 08/14/2019      Reactions   Tape Other (See Comments)   SKIN IS THIN- WILL TEAR EASILY!!!!!   Novocain [procaine] Other (See Comments)   "I passed out"   Procaine Hcl Other (See Comments), Hypertension   "PASSED OUT"      Medication List    STOP taking these medications   dexamethasone 6 MG tablet Commonly known as: DECADRON     TAKE these medications   acetaminophen 500 MG tablet Commonly known as: TYLENOL Take 1 tablet (500 mg total) by mouth every 6 (six) hours as needed for mild pain, moderate pain, fever or headache.   atorvastatin 10 MG tablet Commonly known as: LIPITOR Take 1 tablet (10 mg total) by mouth daily.   Centrum Silver tablet Take 1 tablet by mouth daily.   Vitamin D 50 MCG (2000 UT) Caps Take 1 capsule by mouth daily.   cholecalciferol 25 MCG (1000 UNIT) tablet Commonly known as: VITAMIN D Take 1 tablet (1,000 Units total) by mouth daily.   digoxin 0.125 MG tablet Commonly known as: Digitek TAKE 1 TABLET (0.125 MG TOTAL) BY MOUTH DAILY. What changed:   how much  to take  how to take this  when to take this  additional instructions   diphenoxylate-atropine 2.5-0.025 MG tablet Commonly known as: LOMOTIL Take 1 tablet by mouth daily.   famotidine 20 MG tablet Commonly known as: PEPCID Take 1 tablet (20 mg total) by mouth at bedtime. What changed: when to take this   furosemide 20 MG tablet Commonly known as: LASIX Take 1 tablet (20 mg total) by mouth daily as needed for fluid or  edema. What changed:   when to take this  reasons to take this   levothyroxine 75 MCG tablet Commonly known as: SYNTHROID Take 75 mcg by mouth daily before breakfast.   meclizine 25 MG tablet Commonly known as: ANTIVERT Take 12.5 mg by mouth 2 (two) times daily as needed for dizziness.   nitroGLYCERIN 0.4 MG SL tablet Commonly known as: NITROSTAT Place 1 tablet (0.4 mg total) under the tongue every 5 (five) minutes as needed for chest pain. What changed: Another medication with the same name was removed. Continue taking this medication, and follow the directions you see here.   potassium chloride 10 MEQ tablet Commonly known as: KLOR-CON TAKE 1 TABLET (10 MEQ TOTAL) BY MOUTH DAILY. What changed:   how much to take  how to take this  when to take this  additional instructions      Contact information for after-discharge care    Destination    [facility] Preferred SNF .   Service: Skilled Nursing Contact information: [address] [place] [address] [phone]              Consultations:  None   Procedures/Studies:  CT Head Wo Contrast  Result Date: 08/07/2019 CLINICAL DATA:  Altered mental status, COVID-19 diagnosis 1.5 weeks prior with admission for delirium on 08/06/2019 EXAM: CT HEAD WITHOUT CONTRAST TECHNIQUE: Contiguous axial images were obtained from the base of the skull through the vertex without intravenous contrast. COMPARISON:  CT head 08/06/2019, 05/04/2016 FINDINGS: Brain: Image quality is degraded due to patient motion. Redemonstration of a region of gliosis in the left occipital lobe which is unchanged from 1 day prior but new since 2017 comparisons. Overall finding favors a remote interval infarct but remains grossly age indeterminate. Prior lacunar infarcts are noted in the bilateral basal ganglia as is some stable senescent mineralization. No other sites concerning for acute infarction. No visible hemorrhage,  hydrocephalus, extra-axial collection or mass lesion/mass effect. Symmetric moderate prominence of the ventricles, cisterns and sulci compatible with parenchymal volume loss. Confluent areas of white matter hypoattenuation are most compatible with chronic microvascular angiopathy. Vascular: Atherosclerotic calcification of the carotid siphons and intradural vertebral arteries. No hyperdense vessel. Skull: No calvarial fracture or suspicious osseous lesion. No scalp swelling or hematoma. Sinuses/Orbits: Paranasal sinuses and mastoid air cells are predominantly clear. Debris in the left external auditory canal. Orbital structures are unremarkable aside from prior lens extractions. Other: None IMPRESSION: Image quality degraded due to motion artifact. Redemonstration of a region of gliosis in the left occipital lobe which is unchanged from 1 day prior but new since 2017 comparisons. Overall finding favors a remote interval infarct but remains grossly age indeterminate. If there is concern that this may be subacute, consider MR for further evaluation though only if patient is able to remain still for the procedure. Remote bilateral basal ganglia lacunar infarcts. Extensive chronic microvascular angiopathy and moderate parenchymal volume loss, similar to priors. Intracranial atherosclerosis. Debris in the left external auditory canal, correlate for  cerumen impaction. Electronically Signed   By: Kreg ShropshirePrice  DeHay M.D.   On: 08/07/2019 23:41   CT Head Wo Contrast  Result Date: 08/06/2019 CLINICAL DATA:  Encephalopathy EXAM: CT HEAD WITHOUT CONTRAST CT CERVICAL SPINE WITHOUT CONTRAST TECHNIQUE: Multidetector CT imaging of the head and cervical spine was performed following the standard protocol without intravenous contrast. Multiplanar CT image reconstructions of the cervical spine were also generated. COMPARISON:  CT 05/04/2016 FINDINGS: CT HEAD FINDINGS Brain: Motion degradation of the exam. Repeat exam with some  improvement. Hypodensities within the LEFT and RIGHT external capsule is consistent remote white matter infarctions. Focal encephalomalacia within the LEFT occipital lobe is new from comparison exam 2017 (image 13/30. No clear acute cortical Concern cortical atrophy. Generalized subcortical white matter hypodensities. No extra-axial collections Vascular: No hyperdense vessel or unexpected calcification. Skull: Normal. Negative for fracture or focal lesion. Sinuses/Orbits: Paranasal sinuses and mastoid air cells are clear. Orbits are clear. Other: None. CT CERVICAL SPINE FINDINGS Alignment: Normal alignment of the cervical vertebral bodies. Skull base and vertebrae: Normal craniocervical junction. No loss of vertebral body height or disc height. Normal facet articulation. No evidence of fracture. Soft tissues and spinal canal: No prevertebral soft tissue swelling. No perispinal or epidural hematoma. Disc levels: Endplate spurring and disc space narrowing from C4 to the C7. No acute findings Upper chest: Clear Other: None IMPRESSION: 1. No acute intracranial findings. 2. Remote deep white matter external capsule infarctions. 3. Probable remote LEFT occipital lobe infarction. 4. Generalized atrophy and white matter microvascular disease. 5. No cervical spine fracture. 6. Multilevel disc osteophytic disease. Electronically Signed   By: Genevive BiStewart  Edmunds M.D.   On: 08/06/2019 14:41   CT Cervical Spine Wo Contrast  Result Date: 08/06/2019 CLINICAL DATA:  Encephalopathy EXAM: CT HEAD WITHOUT CONTRAST CT CERVICAL SPINE WITHOUT CONTRAST TECHNIQUE: Multidetector CT imaging of the head and cervical spine was performed following the standard protocol without intravenous contrast. Multiplanar CT image reconstructions of the cervical spine were also generated. COMPARISON:  CT 05/04/2016 FINDINGS: CT HEAD FINDINGS Brain: Motion degradation of the exam. Repeat exam with some improvement. Hypodensities within the LEFT and RIGHT  external capsule is consistent remote white matter infarctions. Focal encephalomalacia within the LEFT occipital lobe is new from comparison exam 2017 (image 13/30. No clear acute cortical Concern cortical atrophy. Generalized subcortical white matter hypodensities. No extra-axial collections Vascular: No hyperdense vessel or unexpected calcification. Skull: Normal. Negative for fracture or focal lesion. Sinuses/Orbits: Paranasal sinuses and mastoid air cells are clear. Orbits are clear. Other: None. CT CERVICAL SPINE FINDINGS Alignment: Normal alignment of the cervical vertebral bodies. Skull base and vertebrae: Normal craniocervical junction. No loss of vertebral body height or disc height. Normal facet articulation. No evidence of fracture. Soft tissues and spinal canal: No prevertebral soft tissue swelling. No perispinal or epidural hematoma. Disc levels: Endplate spurring and disc space narrowing from C4 to the C7. No acute findings Upper chest: Clear Other: None IMPRESSION: 1. No acute intracranial findings. 2. Remote deep white matter external capsule infarctions. 3. Probable remote LEFT occipital lobe infarction. 4. Generalized atrophy and white matter microvascular disease. 5. No cervical spine fracture. 6. Multilevel disc osteophytic disease. Electronically Signed   By: Genevive BiStewart  Edmunds M.D.   On: 08/06/2019 14:41   DG Chest Port 1 View  Result Date: 07/31/2019 CLINICAL DATA:  Diagnosed as COVID-19 positive on 07/28/2019, increased weakness, decreased appetite, dehydration for 3 days, history Alzheimer's, stroke, coronary artery disease post MI, CHF, ischemic cardiomyopathy, hypertension EXAM:  PORTABLE CHEST 1 VIEW COMPARISON:  Portable exam 1722 hours compared to 11/10/2014 FINDINGS: Upper normal heart size. Mediastinal contours and pulmonary vascularity normal. Atherosclerotic calcification aorta. Patchy infiltrates identified LEFT upper lobe and RIGHT base consistent with multifocal pneumonia. No  pleural effusion or pneumothorax. Bones demineralized. IMPRESSION: Patchy infiltrates LEFT upper lobe and RIGHT base consistent with multifocal pneumonia. Electronically Signed   By: Lavonia Dana M.D.   On: 07/31/2019 17:59     Subjective: - no chest pain, shortness of breath, no abdominal pain, nausea or vomiting.   Discharge Exam: BP (!) 96/50 (BP Location: Right Arm)   Pulse 72   Temp 98 F (36.7 C) (Oral)   Resp 16   Ht 5\' 6"  (1.676 m)   Wt 52.2 kg   SpO2 96%   BMI 18.56 kg/m   General: Pt is alert, awake, not in acute distress Cardiovascular: RRR, S1/S2 +, no rubs, no gallops Respiratory: CTA bilaterally, no wheezing, no rhonchi Abdominal: Soft, NT, ND, bowel sounds + Extremities: no edema, no cyanosis    The results of significant diagnostics from this hospitalization (including imaging, microbiology, ancillary and laboratory) are listed below for reference.     Microbiology: Recent Results (from the past 240 hour(s))  Urine culture     Status: Abnormal   Collection Time: 08/06/19  3:27 PM   Specimen: Urine, Random  Result Value Ref Range Status   Specimen Description URINE, RANDOM  Final   Special Requests NONE  Final   Culture (A)  Final    <10,000 COLONIES/mL INSIGNIFICANT GROWTH Performed at [facility], [place] [address] [place], [place] [address]    Report Status 08/07/2019 FINAL  Final  MRSA PCR Screening     Status: None   Collection Time: 08/09/19 11:30 AM  Result Value Ref Range Status   MRSA by PCR NEGATIVE NEGATIVE Final    Comment:        The GeneXpert MRSA Assay (FDA approved for NASAL specimens only), is one component of a comprehensive MRSA colonization surveillance program. It is not intended to diagnose MRSA infection nor to guide or monitor treatment for MRSA infections. Performed at [facility], [place] [address], [place], [place] [address]      Labs: Basic Metabolic Panel: Recent Labs  Lab 08/09/19 0218  08/10/19 0053 08/11/19 0105 08/12/19 0237 08/13/19 0320  NA 143 144 143 142 143  K 3.5 3.6 3.7 3.7 3.9  CL 111 113* 112* 111 111  CO2 23 24 22 23 25   GLUCOSE 87 81 88 77 89  BUN 50* 37* 31* 31* 33*  CREATININE 1.47* 1.22* 1.17* 1.26* 1.26*  CALCIUM 8.3* 8.4* 8.6* 8.7* 8.8*   Liver Function Tests: Recent Labs  Lab 08/09/19 0218 08/10/19 0053 08/11/19 0105 08/12/19 0237 08/13/19 0320  AST 19 20 19 20 25   ALT 15 16 17 18 19   ALKPHOS 37* 38 39 41 40  BILITOT 2.1* 2.0* 1.7* 1.9* 1.4*  PROT 5.2* 5.0* 5.1* 5.4* 5.3*  ALBUMIN 3.1* 3.0* 3.1* 3.2* 3.1*   CBC: Recent Labs  Lab 08/07/19 2254 08/09/19 0218  WBC 11.0* 7.5  NEUTROABS 5.2 3.3  HGB 13.5 10.7*  HCT 42.6 32.9*  MCV 95.3 94.8  PLT 185 132*   CBG: No results for input(s): GLUCAP in the last 168 hours. Hgb A1c No results for input(s): HGBA1C in the last 72 hours. Lipid Profile No results for input(s): CHOL, HDL, LDLCALC, TRIG, CHOLHDL, LDLDIRECT in the last 72  hours. Thyroid function studies No results for input(s): TSH, T4TOTAL, T3FREE, THYROIDAB in the last 72 hours.  Invalid input(s): FREET3 Urinalysis    Component Value Date/Time   COLORURINE STRAW (A) 08/06/2019 1527   APPEARANCEUR CLEAR 08/06/2019 1527   LABSPEC 1.008 08/06/2019 1527   PHURINE 7.0 08/06/2019 1527   GLUCOSEU NEGATIVE 08/06/2019 1527   HGBUR NEGATIVE 08/06/2019 1527   BILIRUBINUR NEGATIVE 08/06/2019 1527   KETONESUR NEGATIVE 08/06/2019 1527   PROTEINUR NEGATIVE 08/06/2019 1527   UROBILINOGEN 0.2 04/10/2014 0745   NITRITE NEGATIVE 08/06/2019 1527   LEUKOCYTESUR NEGATIVE 08/06/2019 1527    FURTHER DISCHARGE INSTRUCTIONS:   Get Medicines reviewed and adjusted: Please take all your medications with you for your next visit with your Primary MD   Laboratory/radiological data: Please request your Primary MD to go over all hospital tests and procedure/radiological results at the follow up, please ask your Primary MD to get all Hospital  records sent to his/her office.   In some cases, they will be blood work, cultures and biopsy results pending at the time of your discharge. Please request that your primary care M.D. goes through all the records of your hospital data and follows up on these results.   Also Note the following: If you experience worsening of your admission symptoms, develop shortness of breath, life threatening emergency, suicidal or homicidal thoughts you must seek medical attention immediately by calling 911 or calling your MD immediately  if symptoms less severe.   You must read complete instructions/literature along with all the possible adverse reactions/side effects for all the Medicines you take and that have been prescribed to you. Take any new Medicines after you have completely understood and accpet all the possible adverse reactions/side effects.    Do not drive when taking Pain medications or sleeping medications (Benzodaizepines)   Do not take more than prescribed Pain, Sleep and Anxiety Medications. It is not advisable to combine anxiety,sleep and pain medications without talking with your primary care practitioner   Special Instructions: If you have smoked or chewed Tobacco  in the last 2 yrs please stop smoking, stop any regular Alcohol  and or any Recreational drug use.   Wear Seat belts while driving.   Please note: You were cared for by a hospitalist during your hospital stay. Once you are discharged, your primary care physician will handle any further medical issues. Please note that NO REFILLS for any discharge medications will be authorized once you are discharged, as it is imperative that you return to your primary care physician (or establish a relationship with a primary care physician if you do not have one) for your post hospital discharge needs so that they can reassess your need for medications and monitor your lab values.  Time coordinating discharge: 35 minutes  SIGNED:  Pamella Pert, MD, PhD 08/14/2019, 9:51 AM

## 2019-08-14 NOTE — Progress Notes (Signed)
LATE ENTRY PROGRESS REPORT FOR 08/13/19  CLINICAL IMPRESSION: Pt found reclining in chair, initially did not seem very responsive to therapist but with encouragement agreeable to tx. Pt appears to have pulled off all monitoring and was on room air throughout session. Pt needed min-mod a for sit<>stand transfers from both recliner and lower commode. Pt ambulated approx 198ft with RW and mod a, noted some loses of balance with ambulation and pt was not able to self correct, gait pattern was same with step to pattern and some liping noted. Pt with poor insight into situation and also safety concerns. D/C plan remains appropriate.    08/13/19 1113  PT Visit Information  Last PT Received On 08/13/19  Assistance Needed +1  History of Present Illness 86 y.o.femalewith medical history significant ofdementia, hypertension, prior CVA sent in from her nursing home because of frequent falls and not eating and drinking very well. Patient cannot provide any history secondary to her dementia. She has been more confused than normal. She is recently had Covid and was treated with remdesivir and Decadron discharged on August 05, 2019. Patient today found to have acute kidney injury with a creatinine bump up to 1.8. O2 sats are normal on room air. Patient be referred for admission for dehydration in the setting of continued weakness after Covid infection.  Subjective Data  Patient Stated Goal did not voice but one ambulating wanted to keep going and going  Precautions  Precautions Fall  Precaution Comments monitor sats  Restrictions  Weight Bearing Restrictions No  Pain Assessment  Pain Assessment No/denies pain  Bed Mobility  General bed mobility comments Pt recived sitting in recliner  Transfers  Overall transfer level Needs assistance  Equipment used Rolling walker (2 wheeled)  Transfers Sit to/from Stand  Sit to Stand Min assist;Mod assist  General transfer comment from recliner and also from  commode  Ambulation/Gait  Ambulation/Gait assistance Mod assist  Gait Distance (Feet) 150 Feet  Assistive device Rolling walker (2 wheeled)  Gait Pattern/deviations Decreased stride length;Narrow base of support;Step-to pattern;Step-through pattern  General Gait Details noted limping with ambulation, steps RLE to L   Gait velocity very slow  Balance  Overall balance assessment Needs assistance  Sitting-balance support Feet supported  Sitting balance-Leahy Scale Good  Standing balance support During functional activity;Bilateral upper extremity supported  Standing balance-Leahy Scale Poor  Standing balance comment even with UE needs some external support or loses balance and unable to self correct  Exercises  Exercises Other exercises  PT - End of Session  Equipment Utilized During Treatment Gait belt  Activity Tolerance Treatment limited secondary to medical complications (Comment);Patient limited by fatigue;Patient limited by lethargy  Patient left in chair;with call bell/phone within reach;with chair alarm set   PT - Assessment/Plan  PT Plan Current plan remains appropriate  PT Visit Diagnosis Other abnormalities of gait and mobility (R26.89)  PT Frequency (ACUTE ONLY) Min 2X/week  Follow Up Recommendations SNF  PT equipment None recommended by PT  AM-PAC PT "6 Clicks" Mobility Outcome Measure (Version 2)  Help needed turning from your back to your side while in a flat bed without using bedrails? 3  Help needed moving from lying on your back to sitting on the side of a flat bed without using bedrails? 3  Help needed moving to and from a bed to a chair (including a wheelchair)? 2  Help needed standing up from a chair using your arms (e.g., wheelchair or bedside chair)? 2  Help needed to  walk in hospital room? 2  Help needed climbing 3-5 steps with a railing?  2  6 Click Score 14  Consider Recommendation of Discharge To: CIR/SNF/LTACH  PT Goal Progression  Progress towards PT  goals Progressing toward goals  Acute Rehab PT Goals  PT Goal Formulation Patient unable to participate in goal setting  Time For Goal Achievement 08/24/19  Potential to Achieve Goals Fair  PT Time Calculation  PT Start Time (ACUTE ONLY) 0947  PT Stop Time (ACUTE ONLY) 1014  PT Time Calculation (min) (ACUTE ONLY) 27 min  PT General Charges  $$ ACUTE PT VISIT 1 Visit  PT Treatments  $Gait Training 8-22 mins  $Therapeutic Activity 8-22 mins    Drema Pry, PT

## 2019-08-14 NOTE — Care Management Important Message (Signed)
Important Message  Patient Details  Name: [name] MRN: [id number] Date of Birth: Oct 08, 1916   Medicare Important Message Given:  Yes - Important Message mailed due to current National Emergency  Verbal consent obtained due to current National Emergency    Contact Name: [name] Call Date: 08/14/19  Time: 1133 Phone: [phone] Outcome: Spoke with contact Important Message mailed to: Emergency contact on file    [name] 08/14/2019, 11:34 AM

## 2019-09-09 DEATH — deceased
